# Patient Record
Sex: Female | Born: 1945 | Race: White | Hispanic: No | Marital: Married | Smoking: Never smoker
Health system: Southern US, Community
[De-identification: ages and names within clinical notes are randomized; demographics above are authoritative.]

## PROBLEM LIST (undated history)

## (undated) DIAGNOSIS — R011 Cardiac murmur, unspecified: Secondary | ICD-10-CM

## (undated) DIAGNOSIS — K5190 Ulcerative colitis, unspecified, without complications: Secondary | ICD-10-CM

## (undated) DIAGNOSIS — C50919 Malignant neoplasm of unspecified site of unspecified female breast: Secondary | ICD-10-CM

## (undated) DIAGNOSIS — R7989 Other specified abnormal findings of blood chemistry: Secondary | ICD-10-CM

## (undated) DIAGNOSIS — L900 Lichen sclerosus et atrophicus: Secondary | ICD-10-CM

## (undated) DIAGNOSIS — C4920 Malignant neoplasm of connective and soft tissue of unspecified lower limb, including hip: Secondary | ICD-10-CM

## (undated) DIAGNOSIS — K432 Incisional hernia without obstruction or gangrene: Secondary | ICD-10-CM

## (undated) DIAGNOSIS — Z853 Personal history of malignant neoplasm of breast: Secondary | ICD-10-CM

## (undated) DIAGNOSIS — C801 Malignant (primary) neoplasm, unspecified: Secondary | ICD-10-CM

## (undated) DIAGNOSIS — Z5189 Encounter for other specified aftercare: Secondary | ICD-10-CM

## (undated) DIAGNOSIS — M810 Age-related osteoporosis without current pathological fracture: Secondary | ICD-10-CM

## (undated) DIAGNOSIS — M7752 Other enthesopathy of left foot: Secondary | ICD-10-CM

## (undated) DIAGNOSIS — K219 Gastro-esophageal reflux disease without esophagitis: Secondary | ICD-10-CM

## (undated) DIAGNOSIS — I739 Peripheral vascular disease, unspecified: Secondary | ICD-10-CM

## (undated) DIAGNOSIS — Z85820 Personal history of malignant melanoma of skin: Secondary | ICD-10-CM

## (undated) DIAGNOSIS — K760 Fatty (change of) liver, not elsewhere classified: Secondary | ICD-10-CM

## (undated) DIAGNOSIS — I10 Essential (primary) hypertension: Secondary | ICD-10-CM

## (undated) DIAGNOSIS — S92902A Unspecified fracture of left foot, initial encounter for closed fracture: Secondary | ICD-10-CM

## (undated) DIAGNOSIS — Z85828 Personal history of other malignant neoplasm of skin: Secondary | ICD-10-CM

## (undated) DIAGNOSIS — I890 Lymphedema, not elsewhere classified: Secondary | ICD-10-CM

## (undated) DIAGNOSIS — K635 Polyp of colon: Secondary | ICD-10-CM

## (undated) DIAGNOSIS — R112 Nausea with vomiting, unspecified: Secondary | ICD-10-CM

## (undated) DIAGNOSIS — K529 Noninfective gastroenteritis and colitis, unspecified: Secondary | ICD-10-CM

## (undated) DIAGNOSIS — Z9889 Other specified postprocedural states: Secondary | ICD-10-CM

## (undated) DIAGNOSIS — N390 Urinary tract infection, site not specified: Secondary | ICD-10-CM

## (undated) DIAGNOSIS — R32 Unspecified urinary incontinence: Secondary | ICD-10-CM

## (undated) DIAGNOSIS — K52832 Lymphocytic colitis: Secondary | ICD-10-CM

## (undated) DIAGNOSIS — E785 Hyperlipidemia, unspecified: Secondary | ICD-10-CM

## (undated) HISTORY — PX: BUNIONECTOMY: SHX129

## (undated) HISTORY — DX: Malignant (primary) neoplasm, unspecified: C80.1

## (undated) HISTORY — DX: Peripheral vascular disease, unspecified: I73.9

## (undated) HISTORY — DX: Hyperlipidemia, unspecified: E78.5

## (undated) HISTORY — DX: Essential (primary) hypertension: I10

## (undated) HISTORY — DX: Personal history of malignant melanoma of skin: Z85.820

## (undated) HISTORY — DX: Personal history of other malignant neoplasm of skin: Z85.828

## (undated) HISTORY — DX: Nausea with vomiting, unspecified: R11.2

## (undated) HISTORY — PX: BREAST IMPLANT: SHX2716

## (undated) HISTORY — DX: Urinary tract infection, site not specified: N39.0

## (undated) HISTORY — DX: Age-related osteoporosis without current pathological fracture: M81.0

## (undated) HISTORY — PX: COLON SURGERY: SHX602

## (undated) HISTORY — DX: Unspecified fracture of left foot, initial encounter for closed fracture: S92.902A

## (undated) HISTORY — DX: Other specified postprocedural states: Z98.890

## (undated) HISTORY — DX: Fatty (change of) liver, not elsewhere classified: K76.0

## (undated) HISTORY — PX: OOPHORECTOMY: SHX86

## (undated) HISTORY — DX: Lymphocytic colitis: K52.832

## (undated) HISTORY — DX: Personal history of malignant neoplasm of breast: Z85.3

## (undated) HISTORY — PX: SMALL INTESTINE SURGERY: SHX150

## (undated) HISTORY — DX: Gastro-esophageal reflux disease without esophagitis: K21.9

## (undated) HISTORY — DX: Noninfective gastroenteritis and colitis, unspecified: K52.9

## (undated) HISTORY — DX: Unspecified urinary incontinence: R32

## (undated) HISTORY — DX: Polyp of colon: K63.5

## (undated) HISTORY — DX: Lymphedema, not elsewhere classified: I89.0

## (undated) HISTORY — PX: MASTECTOMY: SHX3

## (undated) HISTORY — PX: SIGMOID RESECTION / RECTOPEXY: SUR1294

## (undated) HISTORY — DX: Other enthesopathy of left foot and ankle: M77.52

## (undated) HISTORY — DX: Other specified abnormal findings of blood chemistry: R79.89

## (undated) HISTORY — DX: Incisional hernia without obstruction or gangrene: K43.2

## (undated) HISTORY — DX: Cardiac murmur, unspecified: R01.1

## (undated) HISTORY — DX: Malignant neoplasm of connective and soft tissue of unspecified lower limb, including hip: C49.20

## (undated) HISTORY — DX: Ulcerative colitis, unspecified, without complications: K51.90

## (undated) HISTORY — DX: Lichen sclerosus et atrophicus: L90.0

## (undated) HISTORY — PX: AUGMENTATION MAMMAPLASTY: SUR837

## (undated) HISTORY — PX: OTHER SURGICAL HISTORY: SHX169

## (undated) HISTORY — PX: TONSILLECTOMY AND ADENOIDECTOMY: SHX28

## (undated) HISTORY — DX: Encounter for other specified aftercare: Z51.89

---
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
## 1969-02-27 DIAGNOSIS — Z5189 Encounter for other specified aftercare: Secondary | ICD-10-CM

## 1969-02-27 HISTORY — DX: Encounter for other specified aftercare: Z51.89

## 1973-06-29 DIAGNOSIS — D48118 Desmoid tumor of other site: Secondary | ICD-10-CM

## 1973-06-29 DIAGNOSIS — I739 Peripheral vascular disease, unspecified: Secondary | ICD-10-CM

## 1973-06-29 DIAGNOSIS — D481 Neoplasm of uncertain behavior of connective and other soft tissue: Secondary | ICD-10-CM

## 1973-06-29 HISTORY — DX: Desmoid tumor of other site: D48.118

## 1973-06-29 HISTORY — DX: Peripheral vascular disease, unspecified: I73.9

## 1973-06-29 HISTORY — DX: Neoplasm of uncertain behavior of connective and other soft tissue: D48.1

## 1977-06-29 HISTORY — PX: HYSTERECTOMY: SHX81

## 1977-06-29 HISTORY — PX: NEPHRECTOMY: SHX65

## 1977-06-29 HISTORY — PX: ABDOMINAL HYSTERECTOMY: SHX81

## 1995-06-30 DIAGNOSIS — C801 Malignant (primary) neoplasm, unspecified: Secondary | ICD-10-CM

## 1995-06-30 HISTORY — DX: Malignant (primary) neoplasm, unspecified: C80.1

## 1995-06-30 HISTORY — PX: MASTECTOMY: SHX3

## 1995-06-30 HISTORY — PX: BREAST SURGERY: SHX581

## 1999-01-27 ENCOUNTER — Other Ambulatory Visit: Admission: RE | Admit: 1999-01-27 | Discharge: 1999-01-27 | Payer: Self-pay | Admitting: Gynecology

## 1999-12-25 ENCOUNTER — Ambulatory Visit (HOSPITAL_COMMUNITY): Admission: RE | Admit: 1999-12-25 | Discharge: 1999-12-25 | Payer: Self-pay | Admitting: Gastroenterology

## 2000-03-19 ENCOUNTER — Ambulatory Visit (HOSPITAL_BASED_OUTPATIENT_CLINIC_OR_DEPARTMENT_OTHER): Admission: RE | Admit: 2000-03-19 | Discharge: 2000-03-19 | Payer: Self-pay | Admitting: General Surgery

## 2000-06-29 HISTORY — PX: OTHER SURGICAL HISTORY: SHX169

## 2001-01-24 ENCOUNTER — Other Ambulatory Visit: Admission: RE | Admit: 2001-01-24 | Discharge: 2001-01-24 | Payer: Self-pay | Admitting: Gynecology

## 2002-02-16 ENCOUNTER — Other Ambulatory Visit: Admission: RE | Admit: 2002-02-16 | Discharge: 2002-02-16 | Payer: Self-pay

## 2003-04-11 ENCOUNTER — Other Ambulatory Visit: Admission: RE | Admit: 2003-04-11 | Discharge: 2003-04-11 | Payer: Self-pay | Admitting: Gynecology

## 2003-05-07 ENCOUNTER — Ambulatory Visit (HOSPITAL_COMMUNITY): Admission: RE | Admit: 2003-05-07 | Discharge: 2003-05-07 | Payer: Self-pay | Admitting: Gastroenterology

## 2009-07-15 ENCOUNTER — Ambulatory Visit: Payer: Self-pay | Admitting: Genetic Counselor

## 2009-08-26 ENCOUNTER — Ambulatory Visit: Payer: Self-pay | Admitting: Genetic Counselor

## 2010-11-14 NOTE — Procedures (Signed)
[facility]  Patient:    Megan Mahoney, DINGWALL                        MRN: [id number] Proc. Date: 12/25/99 Adm. Date:  [id number] Attending:  Orland Mustard CC:         Raynald Kemp, M.D.             Al Decant. Janey Greaser, M.D.                           Procedure Report  PROCEDURE:  Colonoscopy.  MEDICATIONS:  Fentanyl 62.5 mcg, Versed 6 mg IV.  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 She had a history of a colectomy in 1980 due to diverticulitis. She had a colonoscopy 3 years ago with an adenomatous polyp removed. There was slight narrowing in her anastomosis. She comes in now for a repeat colonoscopy for follow-up of her colon polyps.  SCOPE:  Olympus adult video colonoscope.  DESCRIPTION OF PROCEDURE:  The procedure had been explained to the patient and consent obtained. With the patient in the left lateral decubitus position, the Olympus adult video colonoscope inserted, advanced under direct vision. The prep was excellent. We were able to advance to the cecum without difficulty. The right lower quadrant was transilluminated, ileocecal valve and appendiceal orifice seen. Scope withdrawn. The cecum, ascending colon, hepatic flexure, transverse colon, splenic flexure, descending and sigmoid colon were seen well. No further polyps seen. The anastomosis was slightly narrowed but there was easy passage of the scope. The scope withdrawn. The patient tolerated the procedure well and maintained on low flow oxygen and pulse oximeter throughout the procedure with no obvious problem.  ASSESSMENT: 1. No evidence of further polyps. 2. Some internal hemorrhoids. 3. Slight stricturing of the anastomosis but easy passage of the scope.  PLAN:   Will recommend repeating in 3 years to evaluate for additional polyps. Also placed on a high fiber diet.  Keep her stools soft. DD:  12/25/99 TD:  12/26/99 Job: [id number]

## 2010-11-14 NOTE — Op Note (Signed)
   [name], Mahoney                           ACCOUNT NO.:  [id number]   MEDICAL RECORD NO.:  [id number]                   PATIENT TYPE:  AMB   LOCATION:  ENDO                                 FACILITY:  [facility]   PHYSICIAN:  James L. Malon Kindle., M.D.          DATE OF BIRTH:  06/19/1946   DATE OF PROCEDURE:  05/07/2003  DATE OF DISCHARGE:                                 OPERATIVE REPORT   PROCEDURE:  Colonoscopy.   MEDICATIONS:  1. Fentanyl 25 mcg.  2. , Versed 5 mg IV.   INDICATION:  Nice woman, who has a previous history of breast cancer.  In  addition to this, she has had a colectomy due to diverticulitis in 1980.  She had an adenomatous polyp removed.  Had a colonoscopy several years ago  that was negative.  This is done as a follow-up.   DESCRIPTION OF PROCEDURE:  The procedure had been explained to the patient  and consent obtained.  The patient in the left lateral decubitus position,  the Olympus pediatric adjustable scope was inserted and advanced.  We were  able to advance easily to the cecum using abdominal pressure.  The cecum was  identified by identification of the ileocecal valve and appendiceal orifice.  The scope was withdrawn, and the cecum, ascending colon, hepatic flexure,  transverse colon, splenic flexure, descending and sigmoid colon were seen  well.  The anastomosis was seen in the sigmoid colon.  No significant  diverticular disease.  No further polyp was seen, and the rectum was free of  polyps as well.  The scope was withdrawn.  The patient tolerated the  procedure well, was maintained on low-flow oxygen and pulse oximetry  throughout the procedure.   ASSESSMENT:  No evidence of further polyps.   PLAN:  We will recommended yearly Hemoccults and recommend repeating the  procedure in five years.                                               James L. Malon Kindle., M.D.    Waldron Session  D:  05/07/2003  T:  05/07/2003  Job:  [id number]   cc:   Megan Mahoney.  Megan Greaser, MD  [address]  [place]  [place] [address]  Fax: [phone]

## 2011-07-27 DIAGNOSIS — Z01419 Encounter for gynecological examination (general) (routine) without abnormal findings: Secondary | ICD-10-CM | POA: Diagnosis not present

## 2011-07-27 DIAGNOSIS — Z124 Encounter for screening for malignant neoplasm of cervix: Secondary | ICD-10-CM | POA: Diagnosis not present

## 2011-07-27 DIAGNOSIS — Z0000 Encounter for general adult medical examination without abnormal findings: Secondary | ICD-10-CM | POA: Diagnosis not present

## 2011-09-21 DIAGNOSIS — I69920 Aphasia following unspecified cerebrovascular disease: Secondary | ICD-10-CM | POA: Diagnosis not present

## 2011-09-21 DIAGNOSIS — I890 Lymphedema, not elsewhere classified: Secondary | ICD-10-CM | POA: Diagnosis not present

## 2011-09-21 DIAGNOSIS — Z7189 Other specified counseling: Secondary | ICD-10-CM | POA: Diagnosis not present

## 2011-09-21 DIAGNOSIS — Z9010 Acquired absence of unspecified breast and nipple: Secondary | ICD-10-CM | POA: Diagnosis not present

## 2011-09-21 DIAGNOSIS — C4370 Malignant melanoma of unspecified lower limb, including hip: Secondary | ICD-10-CM | POA: Diagnosis not present

## 2011-09-21 DIAGNOSIS — D4819 Other specified neoplasm of uncertain behavior of connective and other soft tissue: Secondary | ICD-10-CM | POA: Diagnosis not present

## 2011-09-21 DIAGNOSIS — C50919 Malignant neoplasm of unspecified site of unspecified female breast: Secondary | ICD-10-CM | POA: Diagnosis not present

## 2011-09-21 DIAGNOSIS — R918 Other nonspecific abnormal finding of lung field: Secondary | ICD-10-CM | POA: Diagnosis not present

## 2011-09-21 DIAGNOSIS — Z79899 Other long term (current) drug therapy: Secondary | ICD-10-CM | POA: Diagnosis not present

## 2011-09-21 DIAGNOSIS — D481 Neoplasm of uncertain behavior of connective and other soft tissue: Secondary | ICD-10-CM | POA: Diagnosis not present

## 2011-09-21 DIAGNOSIS — I69928 Other speech and language deficits following unspecified cerebrovascular disease: Secondary | ICD-10-CM | POA: Diagnosis not present

## 2011-11-09 DIAGNOSIS — K5289 Other specified noninfective gastroenteritis and colitis: Secondary | ICD-10-CM | POA: Diagnosis not present

## 2012-05-19 DIAGNOSIS — Z85828 Personal history of other malignant neoplasm of skin: Secondary | ICD-10-CM | POA: Diagnosis not present

## 2012-05-19 DIAGNOSIS — Z85820 Personal history of malignant melanoma of skin: Secondary | ICD-10-CM | POA: Diagnosis not present

## 2012-05-19 DIAGNOSIS — D235 Other benign neoplasm of skin of trunk: Secondary | ICD-10-CM | POA: Diagnosis not present

## 2012-05-19 DIAGNOSIS — L821 Other seborrheic keratosis: Secondary | ICD-10-CM | POA: Diagnosis not present

## 2012-05-19 DIAGNOSIS — D239 Other benign neoplasm of skin, unspecified: Secondary | ICD-10-CM | POA: Diagnosis not present

## 2012-05-19 DIAGNOSIS — L570 Actinic keratosis: Secondary | ICD-10-CM | POA: Diagnosis not present

## 2012-05-19 DIAGNOSIS — D485 Neoplasm of uncertain behavior of skin: Secondary | ICD-10-CM | POA: Diagnosis not present

## 2012-07-27 DIAGNOSIS — Z01419 Encounter for gynecological examination (general) (routine) without abnormal findings: Secondary | ICD-10-CM | POA: Diagnosis not present

## 2012-07-27 DIAGNOSIS — Z1151 Encounter for screening for human papillomavirus (HPV): Secondary | ICD-10-CM | POA: Diagnosis not present

## 2012-07-27 DIAGNOSIS — Z124 Encounter for screening for malignant neoplasm of cervix: Secondary | ICD-10-CM | POA: Diagnosis not present

## 2012-07-27 LAB — HM PAP SMEAR: HM PAP: NEGATIVE

## 2012-08-03 DIAGNOSIS — H35379 Puckering of macula, unspecified eye: Secondary | ICD-10-CM | POA: Diagnosis not present

## 2012-09-21 DIAGNOSIS — D481 Neoplasm of uncertain behavior of connective and other soft tissue: Secondary | ICD-10-CM | POA: Insufficient documentation

## 2012-09-22 DIAGNOSIS — I69920 Aphasia following unspecified cerebrovascular disease: Secondary | ICD-10-CM | POA: Diagnosis not present

## 2012-09-22 DIAGNOSIS — I679 Cerebrovascular disease, unspecified: Secondary | ICD-10-CM | POA: Diagnosis not present

## 2012-09-22 DIAGNOSIS — C50919 Malignant neoplasm of unspecified site of unspecified female breast: Secondary | ICD-10-CM | POA: Diagnosis not present

## 2012-09-22 DIAGNOSIS — D481 Neoplasm of uncertain behavior of connective and other soft tissue: Secondary | ICD-10-CM | POA: Diagnosis not present

## 2012-09-22 DIAGNOSIS — Z853 Personal history of malignant neoplasm of breast: Secondary | ICD-10-CM | POA: Diagnosis not present

## 2012-09-22 DIAGNOSIS — Z79899 Other long term (current) drug therapy: Secondary | ICD-10-CM | POA: Diagnosis not present

## 2012-09-22 DIAGNOSIS — R4701 Aphasia: Secondary | ICD-10-CM | POA: Diagnosis not present

## 2012-09-22 DIAGNOSIS — Z9010 Acquired absence of unspecified breast and nipple: Secondary | ICD-10-CM | POA: Diagnosis not present

## 2012-11-23 DIAGNOSIS — K5289 Other specified noninfective gastroenteritis and colitis: Secondary | ICD-10-CM | POA: Diagnosis not present

## 2012-11-23 DIAGNOSIS — Z86010 Personal history of colonic polyps: Secondary | ICD-10-CM | POA: Diagnosis not present

## 2013-02-16 DIAGNOSIS — B379 Candidiasis, unspecified: Secondary | ICD-10-CM | POA: Diagnosis not present

## 2013-04-27 DIAGNOSIS — Z86010 Personal history of colonic polyps: Secondary | ICD-10-CM | POA: Diagnosis not present

## 2013-04-27 DIAGNOSIS — Z09 Encounter for follow-up examination after completed treatment for conditions other than malignant neoplasm: Secondary | ICD-10-CM | POA: Diagnosis not present

## 2013-04-27 DIAGNOSIS — K648 Other hemorrhoids: Secondary | ICD-10-CM | POA: Diagnosis not present

## 2013-04-27 DIAGNOSIS — K5289 Other specified noninfective gastroenteritis and colitis: Secondary | ICD-10-CM | POA: Diagnosis not present

## 2013-04-27 DIAGNOSIS — Z8719 Personal history of other diseases of the digestive system: Secondary | ICD-10-CM | POA: Diagnosis not present

## 2013-04-27 DIAGNOSIS — D126 Benign neoplasm of colon, unspecified: Secondary | ICD-10-CM | POA: Diagnosis not present

## 2013-05-29 ENCOUNTER — Encounter: Payer: Self-pay | Admitting: Obstetrics and Gynecology

## 2013-06-15 DIAGNOSIS — L821 Other seborrheic keratosis: Secondary | ICD-10-CM | POA: Diagnosis not present

## 2013-06-15 DIAGNOSIS — L570 Actinic keratosis: Secondary | ICD-10-CM | POA: Diagnosis not present

## 2013-06-15 DIAGNOSIS — Z85820 Personal history of malignant melanoma of skin: Secondary | ICD-10-CM | POA: Diagnosis not present

## 2013-06-15 DIAGNOSIS — Z85828 Personal history of other malignant neoplasm of skin: Secondary | ICD-10-CM | POA: Diagnosis not present

## 2013-06-15 DIAGNOSIS — D239 Other benign neoplasm of skin, unspecified: Secondary | ICD-10-CM | POA: Diagnosis not present

## 2013-07-04 ENCOUNTER — Other Ambulatory Visit: Payer: Self-pay

## 2013-07-04 MED ORDER — VENLAFAXINE HCL ER 37.5 MG PO CP24: 37.5000 mg | ORAL_CAPSULE | Freq: Every day | ORAL | Status: DC

## 2013-07-04 NOTE — Telephone Encounter (Signed)
Pt has aex 08-23-13 with dr Quincy Simmonds. rx had to be sent with another provider other than dr romine since she retired. 90 day supply sent with no refills

## 2013-07-28 ENCOUNTER — Ambulatory Visit: Payer: Self-pay | Admitting: Obstetrics and Gynecology

## 2013-07-31 ENCOUNTER — Ambulatory Visit: Payer: Self-pay | Admitting: Obstetrics and Gynecology

## 2013-08-23 ENCOUNTER — Ambulatory Visit: Payer: Self-pay | Admitting: Obstetrics and Gynecology

## 2013-08-23 ENCOUNTER — Encounter: Payer: Self-pay | Admitting: Obstetrics and Gynecology

## 2013-08-30 ENCOUNTER — Other Ambulatory Visit (HOSPITAL_COMMUNITY): Payer: Self-pay | Admitting: Family Medicine

## 2013-08-30 DIAGNOSIS — Z1231 Encounter for screening mammogram for malignant neoplasm of breast: Secondary | ICD-10-CM

## 2013-08-31 ENCOUNTER — Other Ambulatory Visit: Payer: Self-pay | Admitting: *Deleted

## 2013-08-31 NOTE — Telephone Encounter (Signed)
E-scribe requesting Venlafaxine XR.  Last AEX 07/27/2012 Last refill 07/04/2013 #90/0 refills Next appt 09/01/2013.  -Called pt and she has enough pills until tonight. We will refill rx when she comes in tomorrow. - Pt agreed.

## 2013-09-01 ENCOUNTER — Other Ambulatory Visit: Payer: Self-pay | Admitting: Obstetrics and Gynecology

## 2013-09-01 ENCOUNTER — Telehealth: Payer: Self-pay | Admitting: Obstetrics and Gynecology

## 2013-09-01 ENCOUNTER — Encounter: Payer: Self-pay | Admitting: Obstetrics and Gynecology

## 2013-09-01 ENCOUNTER — Ambulatory Visit (INDEPENDENT_AMBULATORY_CARE_PROVIDER_SITE_OTHER): Payer: Medicare Other | Admitting: Obstetrics and Gynecology

## 2013-09-01 VITALS — BP 118/70 | HR 76 | Ht 65.5 in | Wt 141.5 lb

## 2013-09-01 DIAGNOSIS — Z09 Encounter for follow-up examination after completed treatment for conditions other than malignant neoplasm: Secondary | ICD-10-CM | POA: Diagnosis not present

## 2013-09-01 DIAGNOSIS — Z124 Encounter for screening for malignant neoplasm of cervix: Secondary | ICD-10-CM | POA: Diagnosis not present

## 2013-09-01 DIAGNOSIS — M8580 Other specified disorders of bone density and structure, unspecified site: Secondary | ICD-10-CM

## 2013-09-01 DIAGNOSIS — N951 Menopausal and female climacteric states: Secondary | ICD-10-CM

## 2013-09-01 DIAGNOSIS — M899 Disorder of bone, unspecified: Secondary | ICD-10-CM | POA: Diagnosis not present

## 2013-09-01 DIAGNOSIS — Z01419 Encounter for gynecological examination (general) (routine) without abnormal findings: Secondary | ICD-10-CM

## 2013-09-01 DIAGNOSIS — M949 Disorder of cartilage, unspecified: Secondary | ICD-10-CM | POA: Diagnosis not present

## 2013-09-01 DIAGNOSIS — Z780 Asymptomatic menopausal state: Secondary | ICD-10-CM

## 2013-09-01 LAB — COMPREHENSIVE METABOLIC PANEL
ALK PHOS: 61 U/L (ref 39–117)
ALT: 16 U/L (ref 0–35)
AST: 21 U/L (ref 0–37)
Albumin: 4.3 g/dL (ref 3.5–5.2)
BUN: 11 mg/dL (ref 6–23)
CO2: 30 mEq/L (ref 19–32)
Calcium: 9 mg/dL (ref 8.4–10.5)
Chloride: 103 mEq/L (ref 96–112)
Creat: 0.77 mg/dL (ref 0.50–1.10)
Glucose, Bld: 91 mg/dL (ref 70–99)
Potassium: 4.4 mEq/L (ref 3.5–5.3)
Sodium: 139 mEq/L (ref 135–145)
Total Bilirubin: 0.2 mg/dL (ref 0.2–1.2)
Total Protein: 6.8 g/dL (ref 6.0–8.3)

## 2013-09-01 LAB — CBC
HCT: 40.2 % (ref 36.0–46.0)
Hemoglobin: 13.6 g/dL (ref 12.0–15.0)
MCH: 28.9 pg (ref 26.0–34.0)
MCHC: 33.8 g/dL (ref 30.0–36.0)
MCV: 85.5 fL (ref 78.0–100.0)
PLATELETS: 261 10*3/uL (ref 150–400)
RBC: 4.7 MIL/uL (ref 3.87–5.11)
RDW: 13.9 % (ref 11.5–15.5)
WBC: 4.7 10*3/uL (ref 4.0–10.5)

## 2013-09-01 MED ORDER — VENLAFAXINE HCL ER 37.5 MG PO CP24: 37.5000 mg | ORAL_CAPSULE | Freq: Every day | ORAL | Status: DC

## 2013-09-01 NOTE — Patient Instructions (Signed)

## 2013-09-01 NOTE — Progress Notes (Addendum)
Patient ID: Megan Mahoney, female   DOB: July 12, 1945, 68 y.o.   MRN: [id number] GYNECOLOGY VISIT  PCP:  Milagros Evener, MD  Referring provider:   HPI: 68 y.o.   Single  Caucasian  female   G2P2002 with No LMP recorded. Patient has had a hysterectomy.   here for   AEX.  Diagnosed with colitis 2 years ago.  Dr. Oletta Lamas treating.   Wants to wean off Effexor XR 37.5 mg.  Wants general labs today - lipids, CMP, CBC, TSH.  Hgb:     Urine:  Unable to go.  GYNECOLOGIC HISTORY: No LMP recorded. Patient has had a hysterectomy. Sexually active:  no Partner preference: female Contraception:  hysterectomy  Menopausal hormone therapy: n/a DES exposure:   no Blood transfusions:   Multiple blood transfusions following surgery for desmoid turmor in the 70's. Sexually transmitted diseases:  no  GYN procedures and prior surgeries:  TAH/BSO/nephrectomy - desmoid tumor.  History of radiation treatment.  Last mammogram:  08/2012 [id number]               Last pap and high risk HPV testing:   07-27-12 wnl:neg HR HPV History of abnormal pap smear:  no   OB History   Grav Para Term Preterm Abortions TAB SAB Ect Mult Living   2 2 2       2        LIFESTYLE: Exercise:    no           Tobacco:    no Alcohol:       no Drug use:    no  OTHER HEALTH MAINTENANCE: Tetanus/TDap:   2010 Gardisil:               n/a Influenza:            03-2013 Zostavax:             Completed with PCP  Bone density:      2011 revealed osteopenia  Colonoscopy:      04/2013 wnl with Dr. Laurence Spates  Cholesterol check: 2013 wnl   Family History  Problem Relation Age of Onset  . Ovarian cancer Sister 27    dec  . Cancer Maternal Grandmother 90    colon  . Transient ischemic attack Maternal Grandmother   . Breast cancer Paternal Grandmother 82    There are no active problems to display for this patient.  Past Medical History  Diagnosis Date  . Osteopenia   . History of breast cancer   . Cancer 1997   Rt. breast ca  . Blood transfusion without reported diagnosis 1970's    multiple--following desmoid tumor surgery  . Heart murmur     Past Surgical History  Procedure Laterality Date  . Nephrectomy  1979  . Abdominal hysterectomy  1979    TAH/BSO  . Breast surgery  1997    Rt. mastectomy/reconstruction  . Melanoma removal  2002    --rt. thigh  . Desmoid tumor      --left groin--s/p five resections  . Sigmoid resection / rectopexy    . Tonsillectomy and adenoidectomy      ALLERGIES: Codeine  Current Outpatient Prescriptions  Medication Sig Dispense Refill  . Cyanocobalamin (VITAMIN B-12) 1000 MCG SUBL Place 1,000 mcg under the tongue daily.      . mesalamine (LIALDA) 1.2 G EC tablet Take 1.2 g by mouth daily with breakfast.      . Multiple Vitamin (MULTIVITAMIN) capsule Take 1 capsule  by mouth daily.      . polycarbophil (FIBERCON) 625 MG tablet Take 1,250 mg by mouth daily.      Marland Kitchen venlafaxine XR (EFFEXOR XR) 37.5 MG 24 hr capsule Take 1 capsule (37.5 mg total) by mouth daily with breakfast.  90 capsule  0   No current facility-administered medications for this visit.     ROS:  Pertinent items are noted in HPI.  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  Husband will retire soon.   PHYSICAL EXAMINATION:    BP 118/70  Pulse 76  Ht 5' 5.5" (1.664 m)  Wt 141 lb 8 oz (64.184 kg)  BMI 23.18 kg/m2   Wt Readings from Last 3 Encounters:  09/01/13 141 lb 8 oz (64.184 kg)     Ht Readings from Last 3 Encounters:  09/01/13 5' 5.5" (1.664 m)    General appearance: alert, cooperative and appears stated age Head: Normocephalic, without obvious abnormality, atraumatic Neck: no adenopathy, supple, symmetrical, trachea midline and thyroid not enlarged, symmetric, no tenderness/mass/nodules Lungs: clear to auscultation bilaterally Breasts: Right breast absent and implant palpable.  Left breast - scar noted, No nipple retraction or dimpling, No nipple discharge or bleeding, No  axillary or supraclavicular adenopathy, Normal to palpation without dominant masses. Heart: regular rate and rhythm Abdomen: left paramedian scar extending down left groin and left thigh, left transverse scar, abdomen soft, non-tender; no masses,  no organomegaly Extremities: extremities normal, atraumatic, no cyanosis or edema Skin: Skin color, texture, turgor normal. No rashes or lesions Lymph nodes: Cervical, supraclavicular, and axillary nodes normal. No abnormal inguinal nodes palpated.  Left inguinal area with hernia. Neurologic: Grossly normal  Thin Pederson speculum used.    Pelvic: External genitalia:  no lesions Hypopigmentation of the clitoral hood and the perianal region.               Urethra:  normal appearing urethra with no masses, tenderness or lesions              Bartholins and Skenes: normal                 Vagina: normal appearing vagina with normal color and discharge, no lesions              Cervix: absent              Pap and high risk HPV testing done: no.            Bimanual Exam:  Uterus:   absent                                      Adnexa:  no masses                                      Rectovaginal: Confirms                                      Anus:  normal sphincter tone, no lesions  ASSESSMENT  Normal gynecologic exam. Status post TAH/BSO and status post nephrectomy for desmoid tumor. Right breast cancer.  Status post right mastectomy with reconstruction.  Osteopenia. Effexor patient.  Desires to wean off.  PLAN  Mammogram recommended yearly - Will do at  [facility].  Order placed.  Bone density at [place] placed.  Pap smear and high risk HPV testing not indicated.  TSH, lipid profile, CBC, CMP. Counseled on self breast exam, Calcium and vitamin D intake, exercise. Will refill Effexor XR 37.5 mg and plan to start weaning.   Will continue with Effexor XR 37.5 mg and will then need to convert to the Effexor immediate release  37.5 mg po bid and work down to Effexor immediate release 25 mg po bid.  Return annually or prn   An After Visit Summary was printed and given to the patient.

## 2013-09-01 NOTE — Telephone Encounter (Signed)
Phone call to discuss weaning plan for Effexor.  Currently on Effexor XR 37.5 mg daily.   Will continue for the next month on the above.  Will then switch to Effexor immediate release 37 mg po bid and work down to Effexor immediate release 25 mg po bid.  I just left a message telling her that we needed to get in touch with each other on Monday, 09/04/13.  No details left on the phone regarding the above.

## 2013-09-02 LAB — LIPID PANEL
CHOL/HDL RATIO: 2.4 ratio
Cholesterol: 171 mg/dL (ref 0–200)
HDL: 70 mg/dL (ref >39)
LDL CALC: 45 mg/dL (ref 0–99)
Triglycerides: 278 mg/dL — ABNORMAL HIGH (ref <150)
VLDL: 56 mg/dL — AB (ref 0–40)

## 2013-09-02 LAB — TSH: TSH: 2.18 u[IU]/mL (ref 0.350–4.500)

## 2013-09-04 MED ORDER — VENLAFAXINE HCL 25 MG PO TABS: 25.0000 mg | ORAL_TABLET | Freq: Two times a day (BID) | ORAL | Status: DC

## 2013-09-04 NOTE — Telephone Encounter (Signed)
Megan Mahoney,  Please call the patient and let her know what her weaning protocol will be for coming off the Effexor. I have not placed any orders other than refilling the Effexor XR 37.5 mg from her visit the last week.  I gave her enough for one month.  Thanks!

## 2013-09-04 NOTE — Telephone Encounter (Signed)
Phone call to discuss lab results and weaning off Effexor.  Patient informed of elevated triglycerides and VLDL. She will do low fat/low cholesterol diet and exercise for 3 months and then do a fasting  recheck with her PCP.  Patient will start weaning off Effexor XR 37.5 mg next month.  We decided she will go directly to Effexor immediate release 25 mg po bid for 3 months and then try taking it just once a day for 3 months before stopping it.

## 2013-09-04 NOTE — Telephone Encounter (Signed)
Please call patient on her cell phone [phone]

## 2013-09-04 NOTE — Telephone Encounter (Signed)
Pt is returning a call to Dr. Quincy Simmonds

## 2013-09-05 ENCOUNTER — Telehealth: Payer: Self-pay | Admitting: *Deleted

## 2013-09-07 ENCOUNTER — Telehealth: Payer: Self-pay

## 2013-09-07 NOTE — Telephone Encounter (Signed)
LMOVM to call for lab results (cell#)

## 2013-09-07 NOTE — Telephone Encounter (Signed)
Message copied by Lowella Fairy on Thu Sep 07, 2013  3:22 PM ------      Message from: [place], [place]: Mon Sep 04, 2013  5:39 PM       Arbie Cookey,             Here is the cholesterol panel for this patient.       Her triglycerides and VLDL cholesterol are elevated.      She will need to do a low fat and low cholesterol diet and increase exercise and follow up with her PCP for management.             Thanks!            Danella Maiers. ------

## 2013-09-13 ENCOUNTER — Ambulatory Visit: Payer: Self-pay | Admitting: Obstetrics and Gynecology

## 2013-09-19 NOTE — Telephone Encounter (Signed)
See next phone message on 09-07-13.  Encounter closed.

## 2013-09-25 ENCOUNTER — Ambulatory Visit (HOSPITAL_COMMUNITY)
Admission: RE | Admit: 2013-09-25 | Discharge: 2013-09-25 | Disposition: A | Discharge status: Home / Self Care | Payer: Medicare Other | Source: Ambulatory Visit | Attending: Family Medicine | Admitting: Family Medicine

## 2013-09-25 ENCOUNTER — Other Ambulatory Visit (HOSPITAL_COMMUNITY): Payer: Self-pay | Admitting: Family Medicine

## 2013-09-25 DIAGNOSIS — Z1231 Encounter for screening mammogram for malignant neoplasm of breast: Secondary | ICD-10-CM

## 2013-11-08 DIAGNOSIS — K5289 Other specified noninfective gastroenteritis and colitis: Secondary | ICD-10-CM | POA: Diagnosis not present

## 2013-11-22 DIAGNOSIS — M25579 Pain in unspecified ankle and joints of unspecified foot: Secondary | ICD-10-CM | POA: Diagnosis not present

## 2013-11-30 DIAGNOSIS — M25579 Pain in unspecified ankle and joints of unspecified foot: Secondary | ICD-10-CM | POA: Diagnosis not present

## 2013-12-15 DIAGNOSIS — M25579 Pain in unspecified ankle and joints of unspecified foot: Secondary | ICD-10-CM | POA: Diagnosis not present

## 2013-12-23 DIAGNOSIS — M25579 Pain in unspecified ankle and joints of unspecified foot: Secondary | ICD-10-CM | POA: Diagnosis not present

## 2014-01-05 ENCOUNTER — Ambulatory Visit (INDEPENDENT_AMBULATORY_CARE_PROVIDER_SITE_OTHER): Payer: Medicare Other | Admitting: Vascular Surgery

## 2014-01-05 ENCOUNTER — Other Ambulatory Visit (HOSPITAL_COMMUNITY): Payer: Self-pay | Admitting: Orthopedic Surgery

## 2014-01-05 ENCOUNTER — Other Ambulatory Visit: Payer: Self-pay

## 2014-01-05 ENCOUNTER — Ambulatory Visit (HOSPITAL_COMMUNITY)
Admission: RE | Admit: 2014-01-05 | Discharge: 2014-01-05 | Disposition: A | Discharge status: Home / Self Care | Payer: Medicare Other | Source: Ambulatory Visit | Attending: Cardiovascular Disease | Admitting: Cardiovascular Disease

## 2014-01-05 ENCOUNTER — Encounter: Payer: Self-pay | Admitting: Vascular Surgery

## 2014-01-05 ENCOUNTER — Ambulatory Visit (INDEPENDENT_AMBULATORY_CARE_PROVIDER_SITE_OTHER)
Admission: RE | Admit: 2014-01-05 | Discharge: 2014-01-05 | Disposition: A | Discharge status: Home / Self Care | Payer: Medicare Other | Source: Ambulatory Visit | Attending: Vascular Surgery | Admitting: Vascular Surgery

## 2014-01-05 VITALS — BP 139/80 | HR 72 | Ht 65.5 in | Wt 150.4 lb

## 2014-01-05 DIAGNOSIS — M79605 Pain in left leg: Secondary | ICD-10-CM

## 2014-01-05 DIAGNOSIS — I872 Venous insufficiency (chronic) (peripheral): Secondary | ICD-10-CM | POA: Diagnosis not present

## 2014-01-05 DIAGNOSIS — M79609 Pain in unspecified limb: Secondary | ICD-10-CM | POA: Diagnosis not present

## 2014-01-05 DIAGNOSIS — M7989 Other specified soft tissue disorders: Secondary | ICD-10-CM | POA: Diagnosis not present

## 2014-01-05 DIAGNOSIS — M25579 Pain in unspecified ankle and joints of unspecified foot: Secondary | ICD-10-CM | POA: Diagnosis not present

## 2014-01-05 DIAGNOSIS — R609 Edema, unspecified: Secondary | ICD-10-CM | POA: Insufficient documentation

## 2014-01-05 DIAGNOSIS — I82819 Embolism and thrombosis of superficial veins of unspecified lower extremities: Secondary | ICD-10-CM | POA: Diagnosis not present

## 2014-01-05 DIAGNOSIS — I8290 Acute embolism and thrombosis of unspecified vein: Secondary | ICD-10-CM | POA: Insufficient documentation

## 2014-01-05 NOTE — Progress Notes (Signed)
Left Lower Ext. Venous Duplex Completed. Preliminary results by tech -  Negative for DVT in the veins that were clearly visualized. The proximal common femoral vein was technically difficult to see due to scar tissue.   Oda Cogan, BS, RDMS, RVT

## 2014-01-05 NOTE — Progress Notes (Signed)
Referred by:  Aretta Nip, MD [name], [name] [address]  Reason for referral: Swollen Left leg  History of Present Illness  Megan Mahoney is a 68 y.o. 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  Reportedly this patient had a left leg injury in which her left foot was trapped during a fall in March.  She has had progressive lower leg swelling since then.  She had known history of left thigh swelling which she related to the prior 7 surgeries she's had on the left side.  The patient's symptoms include: severe swelling in L leg and numbness in foot.  The patient has had unknown history of DVT, nohistory of varicose vein, no history of venous stasis ulcers, no history of  Lymphedema and known history of skin changes in lower legs.  There is no family history of venous disorders.  The patient has not used compression stockings in the past.  Attempts at prior compression bandages have worsened her thigh swelling  Past Medical History  Diagnosis Date  . Osteopenia   . History of breast cancer   . Cancer 1997    Rt. breast ca  . Blood transfusion without reported diagnosis 1970's    multiple--following desmoid tumor surgery  . Heart murmur   . Ulcerative colitis   . Heart murmur   . Sarcoma of lower extremity     Past Surgical History  Procedure Laterality Date  . Nephrectomy  1979  . Abdominal hysterectomy  1979    TAH/BSO  . Breast surgery  1997    Rt. mastectomy/reconstruction  . Melanoma removal  2002    --rt. thigh  . Desmoid tumor      --left groin--s/p five resections  . Sigmoid resection / rectopexy    . Tonsillectomy and adenoidectomy    . Bunionectomy Right     History   Social History  . Marital Status: Single    Spouse Name: N/A    Number of Children: N/A  . Years of Education: N/A   Occupational History  . Not on file.   Social History  Main Topics  . Smoking status: Never Smoker   . Smokeless tobacco: Not on file  . Alcohol Use: No  . Drug Use: No  . Sexual Activity: No     Comment: TAH/BSO   Other Topics Concern  . Not on file   Social History Narrative  . No narrative on file    Family History  Problem Relation Age of Onset  . Ovarian cancer Sister 65    dec  . Cancer Sister   . Cancer Maternal Grandmother 90    colon  . Transient ischemic attack Maternal Grandmother   . Breast cancer Paternal Grandmother 44  . Deep vein thrombosis Mother   . Cancer Father   . Hypertension Father     Current Outpatient Prescriptions on File Prior to Visit  Medication Sig Dispense Refill  . Cyanocobalamin (VITAMIN B-12) 1000 MCG SUBL Place 2,500 mcg under the tongue daily.       . Multiple Vitamin (MULTIVITAMIN) capsule Take 1 capsule by mouth daily.      Marland Kitchen venlafaxine (EFFEXOR) 25 MG tablet Take 1 tablet (25 mg total) by mouth 2 (two) times daily with a meal. Take for three months. Then try taking 1 tablet (25 mg total) by mouth once a day for three  months and then stop.  60 tablet  5  . mesalamine (LIALDA) 1.2 G EC tablet Take 1.2 g by mouth daily with breakfast.      . polycarbophil (FIBERCON) 625 MG tablet Take 1,250 mg by mouth daily.       No current facility-administered medications on file prior to visit.    Allergies  Allergen Reactions  . Codeine Nausea And Vomiting    REVIEW OF SYSTEMS:  (Positives checked otherwise negative)  CARDIOVASCULAR:  []  chest pain, []  chest pressure, []  palpitations, []  shortness of breath when laying flat, []  shortness of breath with exertion,  []  pain in feet when walking, []  pain in feet when laying flat, []  history of blood clot in veins (DVT), []  history of phlebitis, [x]  swelling in legs, []  varicose veins  PULMONARY:  []  productive cough, []  asthma, []  wheezing  NEUROLOGIC:  []  weakness in arms or legs, []  numbness in arms or legs, []  difficulty speaking or slurred  speech, []  temporary loss of vision in one eye, []  dizziness  HEMATOLOGIC:  []  bleeding problems, []  problems with blood clotting too easily  MUSCULOSKEL:  []  joint pain, []  joint swelling  GASTROINTEST:  []  vomiting blood, []  blood in stool     GENITOURINARY:  []  burning with urination, []  blood in urine  PSYCHIATRIC:  []  history of major depression  INTEGUMENTARY:  []  rashes, []  ulcers  CONSTITUTIONAL:  []  fever, []  chills   Physical Examination Filed Vitals:   01/05/14 1428  BP: 139/80  Pulse: 72  Height: 5' 5.5" (1.664 m)  Weight: 150 lb 6.4 oz (68.221 kg)  SpO2: 100%   Body mass index is 24.64 kg/(m^2).  General: A&O x 3, WDWN  Head: [place]/AT  Ear/Nose/Throat: Hearing grossly intact, nares w/o erythema or drainage, oropharynx w/o Erythema/Exudate  Eyes: PERRLA, EOMI  Neck: Supple, no nuchal rigidity, no palpable LAD  Pulmonary: Sym exp, good air movt, CTAB, no rales, rhonchi, & wheezing  Cardiac: RRR, Nl S1, S2, no Murmurs, rubs or gallops  Vascular: Vessel Right Left  Radial Palpable Palpable  Brachial Palpable Palpable  Carotid Palpable, without bruit Palpable, without bruit  Aorta Not palpable N/A  Femoral Palpable Palpable  Popliteal Not palpable Not palpable  PT Faintly Palpable Faintly Palpable  DP Faintly Palpable Difficult to Palpate due to swelling    Gastrointestinal: soft, NTND, -G/R, - HSM, - masses, - CVAT B, healed incision in midline and left groin  Musculoskeletal: M/S 5/5 throughout,  Extremities without ischemic changes , L leg 3+ edema, mild foot cyanosis, warm foot   Neurologic: CN 2-12 intact , Pain and light touch intact in extremities , Motor exam as listed above  Psychiatric: Judgment intact, Mood & affect appropriate for pt's clinical situation  Dermatologic: See M/S exam for extremity exam, no rashes otherwise noted  Lymph : No Cervical, Axillary, or Inguinal lymphadenopathy   Non-Invasive Vascular Imaging  LLE Venous  Duplex (Date: 01/05/2014):   No flow in left proximal CFV.  No evidence of acute thrombus  Large collateral veins evident.  No DVT and SVT in rest of leg   Medical Decision Making  Megan Mahoney is a 68 y.o. 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  It is not clear to me the etiology of the increased  swelling in this patient's lower leg.  At some point, ascending venography may be needed to sort out this patient's swelling if it does not resolve with conservative measures.  At this point, I would continued with extreme elevation of the left leg.    Once the swelling improves, compressive therapy should be attempted with eventual compression stocking use if possible.  Obtaining her prior surgical record would be important helping determine the next step in this patient.  She may require referral back to [facility] as I suspect the current findings may be related to her prior sarcoma resections.    This patient is also s/p XRT to the left groin, so I doubt she is a candidate for further surgical intervention on that side.  Thank you for allowing [place] to participate in this patient's care.  Adele Barthel, MD [facility] Office: [phone] Pager: [phone]  01/05/2014, 3:59 PM

## 2014-01-09 ENCOUNTER — Telehealth (HOSPITAL_COMMUNITY): Payer: Self-pay | Admitting: *Deleted

## 2014-01-15 DIAGNOSIS — M7750 Other enthesopathy of unspecified foot: Secondary | ICD-10-CM | POA: Diagnosis not present

## 2014-01-18 ENCOUNTER — Encounter: Payer: Self-pay | Admitting: Vascular Surgery

## 2014-01-18 DIAGNOSIS — M7750 Other enthesopathy of unspecified foot: Secondary | ICD-10-CM | POA: Diagnosis not present

## 2014-01-19 ENCOUNTER — Encounter: Payer: Self-pay | Admitting: Vascular Surgery

## 2014-01-19 ENCOUNTER — Ambulatory Visit (INDEPENDENT_AMBULATORY_CARE_PROVIDER_SITE_OTHER): Payer: Medicare Other | Admitting: Vascular Surgery

## 2014-01-19 VITALS — BP 139/76 | HR 65 | Ht 65.5 in | Wt 146.1 lb

## 2014-01-19 DIAGNOSIS — I872 Venous insufficiency (chronic) (peripheral): Secondary | ICD-10-CM

## 2014-01-19 NOTE — Progress Notes (Signed)
    Established Venous Insufficiency  History of Present Illness  Megan Mahoney is a 68 y.o. (05-Sep-1945) female s/p multiple LLQ sarcoma resections who presents with chief complaint: L leg swelling.  The patient's symptoms have improved.  The patient has been elevating her left leg.  The patient's symptoms are: mild swelling in R leg.  The patient's treatment regimen currently included: maximal medical management.  This patient has not started compression stocking use.  The patient's PMH, PSH, SH, FamHx, Med, and Allergies are unchanged from 01/05/14.  On ROS today: swelling improved in L leg, no dyspnea, no leg pain  Physical Examination  Filed Vitals:   01/19/14 1104  BP: 139/76  Pulse: 65  Height: 5' 5.5" (1.664 m)  Weight: 146 lb 1.6 oz (66.271 kg)  SpO2: 100%   Body mass index is 23.93 kg/(m^2).  General: A&O x 3, WDWN  Pulmonary: Sym exp, good air movt, CTAB, no rales, rhonchi, & wheezing  Cardiac: RRR, Nl S1, S2, no Murmurs, rubs or gallops  Vascular: Vessel Right Left  Radial Palpable Palpable  Brachial Palpable Palpable  Carotid Palpable, without bruit Palpable, without bruit  Aorta Not palpable N/A  Femoral Palpable Palpable  Popliteal Not palpable Not palpable  PT Palpable Palpable  DP Palpable Palpable   Gastrointestinal: soft, NTND, -G/R, - HSM, - masses, - CVAT B  Musculoskeletal: M/S 5/5 throughout , Extremities without ischemic changes , LLE edema improved: 1+, no cyanosis today  Neurologic: Pain and light touch intact in extremities , Motor exam as listed above  Medical Decision Making  Megan Mahoney is a 68 y.o. 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    Based on the patient's vascular studies and examination, I have offered the patient: compressive therapy.  If she has a  recurrent acute swelling, normally I would recommend: repeat venous duplex.  If that venous duplex is unchanged, I recommend referral to [facility] for ascending venography.  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  As her procedures were done at [facility], it would better to continue her work-up at that facility, as I suspect input from her surgical oncologist would be important, as left leg swelling may be a manifestation of recurrent sarcoma compressing her more proximal vessels.  Thank you for allowing [place] to participate in this patient's care.  Adele Barthel, MD [facility] [place] Office: [phone] Pager: [phone]  01/19/2014, 12:30 PM

## 2014-01-25 DIAGNOSIS — M7750 Other enthesopathy of unspecified foot: Secondary | ICD-10-CM | POA: Diagnosis not present

## 2014-01-30 DIAGNOSIS — M7750 Other enthesopathy of unspecified foot: Secondary | ICD-10-CM | POA: Diagnosis not present

## 2014-02-01 DIAGNOSIS — M7750 Other enthesopathy of unspecified foot: Secondary | ICD-10-CM | POA: Diagnosis not present

## 2014-02-05 DIAGNOSIS — M7750 Other enthesopathy of unspecified foot: Secondary | ICD-10-CM | POA: Diagnosis not present

## 2014-02-08 DIAGNOSIS — M7750 Other enthesopathy of unspecified foot: Secondary | ICD-10-CM | POA: Diagnosis not present

## 2014-02-13 DIAGNOSIS — M7750 Other enthesopathy of unspecified foot: Secondary | ICD-10-CM | POA: Diagnosis not present

## 2014-02-20 ENCOUNTER — Other Ambulatory Visit: Payer: Self-pay | Admitting: Obstetrics and Gynecology

## 2014-02-20 NOTE — Telephone Encounter (Signed)
Last ov note said patient was weaning off medication. Will need phone call to assess status

## 2014-02-20 NOTE — Telephone Encounter (Signed)
Last AEX: 09/01/13 Last refill:09/04/13 #60, 5 rfs Current AEX:09/07/14  Please advise

## 2014-02-21 NOTE — Telephone Encounter (Signed)
Pt calling shannon back

## 2014-02-21 NOTE — Telephone Encounter (Signed)
Please check with Dr Quincy Simmonds since she saw this patient

## 2014-02-21 NOTE — Telephone Encounter (Signed)
S/w patient she is trying to wean off medication is down to taking the Effexor once daily. Has a "little bit left", she said she didn't take it for one day and she saw a difference. Would like to cut them in half and take them that way so she can wean off.  Please advise.

## 2014-02-21 NOTE — Telephone Encounter (Signed)
Please check with Dr Quincy Simmonds her patient

## 2014-02-21 NOTE — Telephone Encounter (Signed)
Called pt back. LMOM to contact office

## 2014-02-21 NOTE — Telephone Encounter (Signed)
LMOM (cell) to contact the office.

## 2014-02-22 NOTE — Telephone Encounter (Signed)
Routed to Dr Silva for review.  °

## 2014-02-26 ENCOUNTER — Other Ambulatory Visit: Payer: Self-pay | Admitting: Obstetrics and Gynecology

## 2014-02-26 MED ORDER — VENLAFAXINE HCL 25 MG PO TABS: ORAL_TABLET | ORAL | Status: DC

## 2014-02-26 NOTE — Telephone Encounter (Signed)
Dr. Quincy Simmonds did you get a chance to review this?

## 2014-02-26 NOTE — Telephone Encounter (Signed)
Ok for Effexor 25 mg, 1/2 tablet daily for two months and then stop.  #60, RF zero.  I will send this in to her pharmacy.

## 2014-02-27 NOTE — Telephone Encounter (Signed)
Left Message To Call Back  

## 2014-03-01 NOTE — Telephone Encounter (Signed)
Pt informed and voiced understanding. Encounter closed

## 2014-04-03 DIAGNOSIS — H35371 Puckering of macula, right eye: Secondary | ICD-10-CM | POA: Diagnosis not present

## 2014-04-16 DIAGNOSIS — M19072 Primary osteoarthritis, left ankle and foot: Secondary | ICD-10-CM | POA: Diagnosis not present

## 2014-04-16 DIAGNOSIS — M7672 Peroneal tendinitis, left leg: Secondary | ICD-10-CM | POA: Diagnosis not present

## 2014-04-19 ENCOUNTER — Telehealth: Payer: Self-pay | Admitting: Obstetrics and Gynecology

## 2014-04-19 NOTE — Telephone Encounter (Signed)
Left message upcoming appointment has been canceled and need to be rescheduled.

## 2014-04-23 NOTE — Telephone Encounter (Signed)
Rescheduled

## 2014-04-30 ENCOUNTER — Encounter: Payer: Self-pay | Admitting: Vascular Surgery

## 2014-06-27 DIAGNOSIS — Z87898 Personal history of other specified conditions: Secondary | ICD-10-CM | POA: Diagnosis not present

## 2014-06-27 DIAGNOSIS — L821 Other seborrheic keratosis: Secondary | ICD-10-CM | POA: Diagnosis not present

## 2014-06-27 DIAGNOSIS — D224 Melanocytic nevi of scalp and neck: Secondary | ICD-10-CM | POA: Diagnosis not present

## 2014-06-27 DIAGNOSIS — Z85828 Personal history of other malignant neoplasm of skin: Secondary | ICD-10-CM | POA: Diagnosis not present

## 2014-06-27 DIAGNOSIS — Z86018 Personal history of other benign neoplasm: Secondary | ICD-10-CM | POA: Diagnosis not present

## 2014-06-27 DIAGNOSIS — D485 Neoplasm of uncertain behavior of skin: Secondary | ICD-10-CM | POA: Diagnosis not present

## 2014-06-27 DIAGNOSIS — D229 Melanocytic nevi, unspecified: Secondary | ICD-10-CM | POA: Diagnosis not present

## 2014-08-22 ENCOUNTER — Other Ambulatory Visit: Payer: Self-pay | Admitting: Obstetrics and Gynecology

## 2014-08-22 DIAGNOSIS — Z1231 Encounter for screening mammogram for malignant neoplasm of breast: Secondary | ICD-10-CM

## 2014-08-27 ENCOUNTER — Ambulatory Visit (HOSPITAL_COMMUNITY)
Admission: RE | Admit: 2014-08-27 | Discharge: 2014-08-27 | Disposition: A | Discharge status: Home / Self Care | Payer: Medicare Other | Source: Ambulatory Visit | Attending: Obstetrics and Gynecology | Admitting: Obstetrics and Gynecology

## 2014-08-27 DIAGNOSIS — Z01419 Encounter for gynecological examination (general) (routine) without abnormal findings: Secondary | ICD-10-CM

## 2014-08-27 DIAGNOSIS — Z780 Asymptomatic menopausal state: Secondary | ICD-10-CM | POA: Insufficient documentation

## 2014-08-27 DIAGNOSIS — Z13820 Encounter for screening for osteoporosis: Secondary | ICD-10-CM | POA: Insufficient documentation

## 2014-08-27 DIAGNOSIS — M8589 Other specified disorders of bone density and structure, multiple sites: Secondary | ICD-10-CM | POA: Diagnosis not present

## 2014-08-27 DIAGNOSIS — M8580 Other specified disorders of bone density and structure, unspecified site: Secondary | ICD-10-CM

## 2014-08-29 ENCOUNTER — Telehealth: Payer: Self-pay | Admitting: Emergency Medicine

## 2014-08-29 NOTE — Telephone Encounter (Signed)
Patient returned call and message from Dr. Quincy Simmonds given.   She has ulcerative colitis and has stopped taking her calcium/magnesium and vitamin D supplement. It was causing her constipation. Patient is going to research her options on sources of calcium that she can take with her medical conditions and discuss this with Dr. Quincy Simmonds at her annual exam.   Routing to provider for final review. Patient agreeable to disposition. Will close encounter

## 2014-08-29 NOTE — Telephone Encounter (Signed)
-----   Message from Farnam, MD sent at 08/28/2014  5:17 AM EST ----- Please inform patient of bone density showing osteopenia of the hip and forearm.  Prior studies were done likely at Dr. Josie Dixon office.  I recommend daily calcium and vitamin D through dietary sources, if possible 5 servings per day. Weight bearing exercise of the upper and lower limbs is also recommended. She will need another bone density in 2 years.  I will see her for her annual exam this month.   Thanks!

## 2014-08-29 NOTE — Telephone Encounter (Signed)
Message left to return call to Megan Mahoney at [phone].    

## 2014-09-07 ENCOUNTER — Ambulatory Visit: Payer: Medicare Other | Admitting: Obstetrics and Gynecology

## 2014-09-07 ENCOUNTER — Encounter: Payer: Self-pay | Admitting: Obstetrics and Gynecology

## 2014-09-07 ENCOUNTER — Ambulatory Visit (INDEPENDENT_AMBULATORY_CARE_PROVIDER_SITE_OTHER): Payer: Medicare Other | Admitting: Obstetrics and Gynecology

## 2014-09-07 VITALS — BP 130/84 | HR 66 | Resp 14 | Ht 65.5 in | Wt 152.8 lb

## 2014-09-07 DIAGNOSIS — Z01419 Encounter for gynecological examination (general) (routine) without abnormal findings: Secondary | ICD-10-CM | POA: Diagnosis not present

## 2014-09-07 DIAGNOSIS — N9089 Other specified noninflammatory disorders of vulva and perineum: Secondary | ICD-10-CM | POA: Diagnosis not present

## 2014-09-07 DIAGNOSIS — M8580 Other specified disorders of bone density and structure, unspecified site: Secondary | ICD-10-CM

## 2014-09-07 NOTE — Progress Notes (Signed)
Patient ID: Megan Mahoney, female   DOB: [birth year], 69 y.o.   MRN: [id number] 69 y.o. [id number] SingleCaucasianF here for annual exam.   TAH/BSO.  Now is off Effexor and doing well.  Rare night sweat.  Not sleeping well.  Not often.  Using midnight menopause - cam[place]ille, lavendar.  Not doing vigorous exercise.   Irritation of vulva with KY jelly use/sexually activity.   Has ulcerative colitis.  Medication is helping.   PCP:  Milagros Evener, MD  No LMP recorded. Patient has had a hysterectomy.          Sexually active: No. female partner The current method of family planning is status post hysterectomy.    Exercising: No.  none. Smoker:  no  Health Maintenance: Pap:  07-17-12 wnl:neg HR HPV History of abnormal Pap:  no MMG:  09-25-13 extremely dense/nl:[facility] Colonoscopy:  May 2015? BMD:   08-27-14 Osteopenia of hip and forearm:[facility] 2 years TDaP:  2009 Screening Labs:  Hb today: PCP, Urine today: PCP   reports that she has never smoked. She does not have any smokeless tobacco history on file. She reports that she does not drink alcohol or use illicit drugs.  Past Medical History  Diagnosis Date  . Osteopenia   . History of breast cancer   . Cancer 1997    Rt. breast ca  . Blood transfusion without reported diagnosis 1970's    multiple--following desmoid tumor surgery  . Heart murmur   . Ulcerative colitis   . Heart murmur   . Sarcoma of lower extremity   . Tendonitis of ankle, left     Past Surgical History  Procedure Laterality Date  . Nephrectomy  1979  . Abdominal hysterectomy  1979    TAH/BSO  . Breast surgery  1997    Rt. mastectomy/reconstruction  . Melanoma removal  2002    --rt. thigh  . Desmoid tumor      --left groin--s/p five resections  . Sigmoid resection / rectopexy    . Tonsillectomy and adenoidectomy    . Bunionectomy Right     Current Outpatient Prescriptions  Medication Sig Dispense Refill  . mesalamine  (LIALDA) 1.2 G EC tablet Take 1.2 g by mouth daily with breakfast.    . Multiple Vitamin (MULTIVITAMIN) capsule Take 1 capsule by mouth daily.     No current facility-administered medications for this visit.    Family History  Problem Relation Age of Onset  . Ovarian cancer Sister 57    dec  . Cancer Sister   . Cancer Maternal Grandmother 90    colon  . Transient ischemic attack Maternal Grandmother   . Breast cancer Paternal Grandmother 35  . Deep vein thrombosis Mother   . Cancer Father   . Hypertension Father     ROS:  Pertinent items are noted in HPI.  Otherwise, a comprehensive ROS was negative.  Exam:   BP 130/84 mmHg  Pulse 66  Resp 14  Ht 5' 5.5" (1.664 m)  Wt 152 lb 12.8 oz (69.31 kg)  BMI 25.03 kg/m2      Height: 5' 5.5" (166.4 cm)  Ht Readings from Last 3 Encounters:  09/07/14 5' 5.5" (1.664 m)  01/19/14 5' 5.5" (1.664 m)  01/05/14 5' 5.5" (1.664 m)    General appearance: alert, cooperative and appears stated age Head: Normocephalic, without obvious abnormality, atraumatic Neck: no adenopathy, supple, symmetrical, trachea midline and thyroid normal to inspection and palpation Lungs: clear to auscultation  bilaterally Breasts: Right breast absent and implant present.  Left breast with scar, otherwise, no masses, skin retractions, nipple discharge, or axillary adenopathy.  Heart: regular rate and rhythm Abdomen: multiple abdominal scars, soft, non-tender; bowel sounds normal; no masses,  no organomegaly Extremities: extremities normal, atraumatic, no cyanosis or edema of right lower extremity.  Compression stocking of left lower extremity.  Skin: Skin color, texture, turgor normal. No rashes or lesions Lymph nodes: Cervical, supraclavicular, and axillary nodes normal. No abnormal inguinal nodes palpated Neurologic: Grossly normal   Pelvic: External genitalia:  hypopigmentation of vulva.               Urethra:  normal appearing urethra with no masses,  tenderness or lesions              Bartholins and Skenes: normal                 Vagina: normal appearing vagina with normal color and discharge, no lesions              Cervix: absent              Pap taken: No. Bimanual Exam:  Uterus:  uterus absent              Adnexa: normal adnexa and no mass, fullness, tenderness               Rectovaginal: Confirms               Anus:  normal sphincter tone, no lesions  Chaperone was present for exam.  A:  Well Woman with normal exam Status post TAH/BSO. Status post right mastectomy for breast cancer. Osteopenia.  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  P:   Mammogram yearly.  pap smear not indicated.  Discussed Ca/Vit D/weight bearing exercise.  Bone density in 2 years.  Return for vulvar biopsy.  i expect Rx for clobetasol. return annually or prn  Additional 15 minutes discussing osteopenia and vulva skin changes and biopsy/treatment.  Over 50% was spent in counseling.   After visit summary to patient.

## 2014-09-10 ENCOUNTER — Telehealth: Payer: Self-pay | Admitting: Obstetrics and Gynecology

## 2014-09-10 NOTE — Telephone Encounter (Signed)
Call to patient. Advised of benefit quote received for biopsy vulvar.  Patient agreeable. Scheduled procedure.

## 2014-09-12 DIAGNOSIS — L089 Local infection of the skin and subcutaneous tissue, unspecified: Secondary | ICD-10-CM | POA: Diagnosis not present

## 2014-09-12 DIAGNOSIS — D224 Melanocytic nevi of scalp and neck: Secondary | ICD-10-CM | POA: Diagnosis not present

## 2014-09-13 ENCOUNTER — Ambulatory Visit (INDEPENDENT_AMBULATORY_CARE_PROVIDER_SITE_OTHER): Payer: Medicare Other | Admitting: Obstetrics and Gynecology

## 2014-09-13 ENCOUNTER — Encounter: Payer: Self-pay | Admitting: Obstetrics and Gynecology

## 2014-09-13 DIAGNOSIS — N904 Leukoplakia of vulva: Secondary | ICD-10-CM | POA: Diagnosis not present

## 2014-09-13 DIAGNOSIS — N9089 Other specified noninflammatory disorders of vulva and perineum: Secondary | ICD-10-CM

## 2014-09-13 NOTE — Patient Instructions (Signed)

## 2014-09-13 NOTE — Progress Notes (Addendum)
Patient ID: Megan Mahoney, female   DOB: [birth year], 69 y.o.   MRN: [id number] GYNECOLOGY  VISIT   HPI: 69 y.o.   Single  Caucasian  female   G2P2002 with No LMP recorded. Patient has had a hysterectomy.   here for vulvar biopsy.  Patient having itching and irritation.   GYNECOLOGIC HISTORY: No LMP recorded. Patient has had a hysterectomy. Contraception:   hysterectomy Menopausal hormone therapy: none        OB History    Gravida Para Term Preterm AB TAB SAB Ectopic Multiple Living   2 2 2       2          Patient Active Problem List   Diagnosis Date Noted  . Chronic venous insufficiency 01/05/2014    Past Medical History  Diagnosis Date  . Osteopenia   . History of breast cancer   . Cancer 1997    Rt. breast ca  . Blood transfusion without reported diagnosis 1970's    multiple--following desmoid tumor surgery  . Heart murmur   . Ulcerative colitis   . Heart murmur   . Sarcoma of lower extremity   . Tendonitis of ankle, left     Past Surgical History  Procedure Laterality Date  . Nephrectomy  1979  . Abdominal hysterectomy  1979    TAH/BSO  . Breast surgery  1997    Rt. mastectomy/reconstruction  . Melanoma removal  2002    --rt. thigh  . Desmoid tumor      --left groin--s/p five resections  . Sigmoid resection / rectopexy    . Tonsillectomy and adenoidectomy    . Bunionectomy Right     Current Outpatient Prescriptions  Medication Sig Dispense Refill  . mesalamine (LIALDA) 1.2 G EC tablet Take 1.2 g by mouth daily with breakfast.    . Multiple Vitamin (MULTIVITAMIN) capsule Take 1 capsule by mouth daily.     No current facility-administered medications for this visit.     ALLERGIES: Codeine  Family History  Problem Relation Age of Onset  . Ovarian cancer Sister 21    dec  . Cancer Sister   . Cancer Maternal Grandmother 90    colon  . Transient ischemic attack Maternal Grandmother   . Breast cancer Paternal Grandmother 66  . Deep vein  thrombosis Mother   . Cancer Father   . Hypertension Father     History   Social History  . Marital Status: Single    Spouse Name: N/A  . Number of Children: N/A  . Years of Education: N/A   Occupational History  . Not on file.   Social History Main Topics  . Smoking status: Never Smoker   . Smokeless tobacco: Not on file  . Alcohol Use: No  . Drug Use: No  . Sexual Activity: No     Comment: TAH/BSO   Other Topics Concern  . Not on file   Social History Narrative    ROS:  Pertinent items are noted in HPI.  PHYSICAL EXAMINATION:    BP 122/72 mmHg  Pulse 68  Ht 5' 5.5" (1.664 m)  Wt 152 lb (68.947 kg)  BMI 24.90 kg/m2     General appearance: alert, cooperative and appears stated age   Pelvic: External genitalia:  Hypopigmentation of bilateral labia minora and perineal body.   Vulvar biopsy.  Consent for procedure.  Sterile prep with Hibiclens.  Local 1% lidocaine to superior left labia minor and right perineal body.  Lot  number [id number], Expiration 11/28/14. 3 mm punch biopsy used for biopsy of superior left labia minora and right perineal body.   Specimens marked and sent to pathology separately.  AgNO3 used to hemostasis.  Perineum washed with warm water due to burning sensation from Hibiclens. Symptoms much improved after this.  No complications to procedure.  Minimal EBL.                 ASSESSMENT  Vulvar lesions.  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  PLAN  Discussion of lichen sclerosus with patient.  I anticipated use of Clobetasol ointment bid follow return of biopsy results.  Explanation of proper use given to patient.  Instructions and precautions given regarding biopsy sites.  Next appointment likely in 4 weeks for a recheck.    An After Visit Summary was printed and given to the patient.  _10_____ minutes face to face time of which over 50% was spent in counseling.

## 2014-09-17 LAB — IPS OTHER TISSUE BIOPSY

## 2014-09-19 ENCOUNTER — Telehealth: Payer: Self-pay

## 2014-09-19 MED ORDER — CLOBETASOL PROPIONATE 0.05 % EX OINT: 1.0000 "application " | TOPICAL_OINTMENT | Freq: Two times a day (BID) | CUTANEOUS | Status: DC

## 2014-09-19 NOTE — Telephone Encounter (Signed)
-----   Message from Nunzio Cobbs, MD sent at 09/17/2014  6:53 PM EDT ----- Please inform patient of skin pathology results showing lichen sclerosus, which is what the patient and I were expecting. There was no sign of precancer or cancerous cells.   I will treat with Clobetasol 0.05% ointment bid to the affected areas. I have already discussed this treatment plan as well.  Please send it to the pharmacy of her choice.  She will need a recheck with me in 4 weeks.  Cc- Marisa Sprinkles

## 2014-09-19 NOTE — Telephone Encounter (Signed)
Left message to call Kaitlyn at [phone]. 

## 2014-09-19 NOTE — Telephone Encounter (Signed)
Spoke with patient. Results given as seen below. Patient is agreeable. 1 month follow up scheduled for 10/18/2014 at 2:45pm with Dr.Silva. Patient is agreeable to date and time. Clobetasol 0.05% ointment sent to [facility] on file. Patient is agreeable.  Routing to provider for final review. Patient agreeable to disposition. Will close encounter

## 2014-09-27 ENCOUNTER — Ambulatory Visit (HOSPITAL_COMMUNITY)
Admission: RE | Admit: 2014-09-27 | Discharge: 2014-09-27 | Disposition: A | Discharge status: Home / Self Care | Payer: Medicare Other | Source: Ambulatory Visit | Attending: Obstetrics and Gynecology | Admitting: Obstetrics and Gynecology

## 2014-09-27 ENCOUNTER — Other Ambulatory Visit: Payer: Self-pay | Admitting: Obstetrics and Gynecology

## 2014-09-27 DIAGNOSIS — Z1231 Encounter for screening mammogram for malignant neoplasm of breast: Secondary | ICD-10-CM

## 2014-10-18 ENCOUNTER — Encounter: Payer: Self-pay | Admitting: Obstetrics and Gynecology

## 2014-10-18 ENCOUNTER — Ambulatory Visit (INDEPENDENT_AMBULATORY_CARE_PROVIDER_SITE_OTHER): Payer: Medicare Other | Admitting: Obstetrics and Gynecology

## 2014-10-18 VITALS — BP 120/82 | HR 70 | Ht 65.5 in | Wt 155.4 lb

## 2014-10-18 DIAGNOSIS — N904 Leukoplakia of vulva: Secondary | ICD-10-CM | POA: Diagnosis not present

## 2014-10-18 NOTE — Progress Notes (Signed)
Patient ID: Megan Mahoney, female   DOB: 1946/03/10, 69 y.o.   MRN: [id number] GYNECOLOGY  VISIT   HPI: 69 y.o.   Single  Caucasian  female   G2P2002 with No LMP recorded. Patient has had a hysterectomy.   here for 4 week follow up.  Using Clobetsol 0.5% ointment twice daily for 4 weeks for lichen sclerosus.  Skin feels sensitized but does not notice improvement.  Putting ointment on more lateral vulva. No itching.  No burning.   Has burning feeling with KY jelly.  Has a history of breast cancer.  Stopped vaginal estrogen when she received her diagnosis.    GYNECOLOGIC HISTORY: No LMP recorded. Patient has had a hysterectomy. Contraception:  hysterectomy  Menopausal hormone therapy: none  Last pap:  07-17-12 wnl:neg HR HPV Last mammogram:  09-27-14 Unilateral left mammogram: extremely dense/nl; rt. [facility]aste[facility]        OB History    Gravida Para Term Preterm AB TAB SAB Ectopic Multiple Living   2 2 2       2          Patient Active Problem List   Diagnosis Date Noted  . Chronic venous insufficiency 01/05/2014    Past Medical History  Diagnosis Date  . Osteopenia   . History of breast cancer   . Cancer 1997    Rt. breast ca  . Blood transfusion without reported diagnosis 1970's    multiple--following desmoid tumor surgery  . Heart murmur   . Ulcerative colitis   . Heart murmur   . Sarcoma of lower extremity   . Tendonitis of ankle, left   . Lichen sclerosus     --vulva    Past Surgical History  Procedure Laterality Date  . Nephrectomy  1979  . Abdominal hysterectomy  1979    TAH/BSO  . Breast surgery  1997    Rt. mastectomy/reconstruction  . Melanoma removal  2002    --rt. thigh  . Desmoid tumor      --left groin--s/p five resections  . Sigmoid resection / rectopexy    . Tonsillectomy and adenoidectomy    . Bunionectomy Right     Current Outpatient Prescriptions  Medication Sig Dispense Refill  . clobetasol ointment (TEMOVATE) 5.17  % Apply 1 application topically 2 (two) times daily. 30 g 0  . docusate sodium (COLACE) 100 MG capsule Take 100 mg by mouth 2 (two) times daily.    . mesalamine (LIALDA) 1.2 G EC tablet Take 1.2 g by mouth daily with breakfast.    . Multiple Vitamin (MULTIVITAMIN) capsule Take 1 capsule by mouth daily.     No current facility-administered medications for this visit.     ALLERGIES: Codeine  Family History  Problem Relation Age of Onset  . Ovarian cancer Sister 80    dec  . Cancer Sister   . Cancer Maternal Grandmother 90    colon  . Transient ischemic attack Maternal Grandmother   . Breast cancer Paternal Grandmother 3  . Deep vein thrombosis Mother   . Cancer Father   . Hypertension Father     History   Social History  . Marital Status: Single    Spouse Name: N/A  . Number of Children: N/A  . Years of Education: N/A   Occupational History  . Not on file.   Social History Main Topics  . Smoking status: Never Smoker   . Smokeless tobacco: Not on file  . Alcohol Use: No  .  Drug Use: No  . Sexual Activity: No     Comment: TAH/BSO   Other Topics Concern  . Not on file   Social History Narrative    ROS:  Pertinent items are noted in HPI.  PHYSICAL EXAMINATION:    BP 120/82 mmHg  Pulse 70  Ht 5' 5.5" (1.664 m)  Wt 155 lb 6.4 oz (70.489 kg)  BMI 25.46 kg/m2     General appearance: alert, cooperative and appears stated age Lungs: clear to auscultation bilaterally Heart: regular rate and rhythm Abdomen: soft, non-tender; no masses,  no organomegaly No abnormal inguinal nodes palpated  Pelvic: External genitalia:   Excoriation of the medial right labia minora.  Hypopigmentation of bilateral labia minora with fusion in the midline.  Hypopigmentation of the perineum.             ASSESSMENT  Lichen sclerosus.  No real improvement.   History of breast cancer.  Atrophic changes.   PLAN  Continue clobetasol 0.05% twice daily to vulva.  Reviewed where and  how to apply.  Discussed avoiding irritants.  Recheck in 1 month.  Discussed possible local vaginal estrogen therapy for the future.    An After Visit Summary was printed and given to the patient.  __15____ minutes face to face time of which over 50% was spent in counseling.

## 2014-11-07 DIAGNOSIS — K5289 Other specified noninfective gastroenteritis and colitis: Secondary | ICD-10-CM | POA: Diagnosis not present

## 2014-11-15 ENCOUNTER — Encounter: Payer: Self-pay | Admitting: Obstetrics and Gynecology

## 2014-11-15 ENCOUNTER — Ambulatory Visit (INDEPENDENT_AMBULATORY_CARE_PROVIDER_SITE_OTHER): Payer: Medicare Other | Admitting: Obstetrics and Gynecology

## 2014-11-15 VITALS — BP 130/74 | HR 78 | Ht 65.5 in | Wt 156.0 lb

## 2014-11-15 DIAGNOSIS — L900 Lichen sclerosus et atrophicus: Secondary | ICD-10-CM | POA: Diagnosis not present

## 2014-11-15 NOTE — Progress Notes (Signed)
GYNECOLOGY  VISIT   HPI: 69 y.o.   Single  Caucasian  female   G2P2002 with No LMP recorded. Patient has had a hysterectomy.   here for  1 month recheck of lichen sclerosus.  Vulva is feeling better.  Feels the tissue is more plump. Has been using clobetasol twice a day.  Still has a refill remaining.  Uses one tube a month.   States has hemorrhoids and bottom is sore.  Some fecal soiling.   Does use aloe wipes on her vulva and Preparation H wipes around the rectum.  They do not help her. GYNECOLOGIC HISTORY: No LMP recorded. Patient has had a hysterectomy. Contraception: Hysterectomy  Menopausal hormone therapy: None Last mammogram: 09/28/14 bi-rads 1: negative Last pap smear:  07/27/12 wnl neg hr hpv         OB History    Gravida Para Term Preterm AB TAB SAB Ectopic Multiple Living   2 2 2       2          Patient Active Problem List   Diagnosis Date Noted  . Chronic venous insufficiency 01/05/2014    Past Medical History  Diagnosis Date  . Osteopenia   . History of breast cancer   . Cancer 1997    Rt. breast ca  . Blood transfusion without reported diagnosis 1970's    multiple--following desmoid tumor surgery  . Heart murmur   . Ulcerative colitis   . Heart murmur   . Sarcoma of lower extremity   . Tendonitis of ankle, left   . Lichen sclerosus     --vulva    Past Surgical History  Procedure Laterality Date  . Nephrectomy  1979  . Abdominal hysterectomy  1979    TAH/BSO  . Breast surgery  1997    Rt. mastectomy/reconstruction  . Melanoma removal  2002    --rt. thigh  . Desmoid tumor      --left groin--s/p five resections  . Sigmoid resection / rectopexy    . Tonsillectomy and adenoidectomy    . Bunionectomy Right     Current Outpatient Prescriptions  Medication Sig Dispense Refill  . clobetasol ointment (TEMOVATE) 9.64 % Apply 1 application topically 2 (two) times daily. 30 g 0  . docusate sodium (COLACE) 100 MG capsule Take 100 mg by mouth 2  (two) times daily.    . mesalamine (LIALDA) 1.2 G EC tablet Take 1.2 g by mouth daily with breakfast.    . Multiple Vitamin (MULTIVITAMIN) capsule Take 1 capsule by mouth daily.     No current facility-administered medications for this visit.     ALLERGIES: Codeine  Family History  Problem Relation Age of Onset  . Ovarian cancer Sister 55    dec  . Cancer Sister   . Cancer Maternal Grandmother 90    colon  . Transient ischemic attack Maternal Grandmother   . Breast cancer Paternal Grandmother 73  . Deep vein thrombosis Mother   . Cancer Father   . Hypertension Father     History   Social History  . Marital Status: Single    Spouse Name: N/A  . Number of Children: N/A  . Years of Education: N/A   Occupational History  . Not on file.   Social History Main Topics  . Smoking status: Never Smoker   . Smokeless tobacco: Not on file  . Alcohol Use: No  . Drug Use: No  . Sexual Activity: No     Comment: TAH/BSO  Other Topics Concern  . Not on file   Social History Narrative    ROS:  Pertinent items are noted in HPI.  PHYSICAL EXAMINATION:    BP 130/74 mmHg  Pulse 78  Ht 5' 5.5" (1.664 m)  Wt 156 lb (70.761 kg)  BMI 25.56 kg/m2    General appearance: alert, cooperative and appears stated age   Pelvic: External genitalia:  Labia less white in color.  Still has small petechial hemorrhages and sensitivity of the left labia minora.              Urethra:  normal appearing urethra with no masses, tenderness or lesions                        Anus:  White epithelium with cracking around anal opening.   Chaperone was present for exam.  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  PLAN  Counseled regarding changes of perianal epithelium. Use Clobetasol 0.05% to vulva and then perianal region twice a day for one month.  Use aloe wipes for vulva and anal region.  Stop the Preparation H wipes.  Use only hypoallergenic  sanitary products.  Return in one month for a recheck.    An After Visit Summary was printed and given to the patient.  ___15___ minutes face to face time of which over 50% was spent in counseling.

## 2014-11-15 NOTE — Patient Instructions (Signed)
Use the Clobetasol ointment on the vulva twice a day and then around the perianal region twice a day for the next month.  This should make the anal area so much more comfortable also. The tissue there looks just like the lichen sclerosus of the vulva.

## 2014-12-03 ENCOUNTER — Other Ambulatory Visit: Payer: Self-pay | Admitting: Obstetrics and Gynecology

## 2014-12-03 NOTE — Telephone Encounter (Signed)
Medication refill request: Clobetasol 0.05 % Last AEX:  09/07/14 with BS Next AEX: 09/19/15 with BS  Last MMG (if hormonal medication request): 09/28/14 bi-rads 1: negative Refill authorized: Please advise.

## 2015-01-02 ENCOUNTER — Encounter: Payer: Self-pay | Admitting: Obstetrics and Gynecology

## 2015-01-02 ENCOUNTER — Ambulatory Visit (INDEPENDENT_AMBULATORY_CARE_PROVIDER_SITE_OTHER): Payer: Medicare Other | Admitting: Obstetrics and Gynecology

## 2015-01-02 VITALS — BP 120/76 | HR 66 | Ht 65.5 in | Wt 152.0 lb

## 2015-01-02 DIAGNOSIS — L900 Lichen sclerosus et atrophicus: Secondary | ICD-10-CM

## 2015-01-02 NOTE — Progress Notes (Signed)
GYNECOLOGY  VISIT   HPI: 69 y.o.   Single  Caucasian  female   G2P2002 with No LMP recorded. Patient has had a hysterectomy.   here for 1 month recheck of lichen sclerosus. Using the Clobetasol bid near the rectal area. No breaks in the skin.  No burning or itching in the skin.  Tried not to use pads at all. Bottom is feeling much better overall.   GYNECOLOGIC HISTORY: No LMP recorded. Patient has had a hysterectomy. Contraception: hysterectomy Menopausal hormone therapy: none Last mammogram: 09/28/14 Density Cat D bi-rads 1 neg/[facility] Last pap smear: 07/27/12 wnl neg hr hpv.        OB History    Gravida Para Term Preterm AB TAB SAB Ectopic Multiple Living   2 2 2       2          Patient Active Problem List   Diagnosis Date Noted  . Chronic venous insufficiency 01/05/2014    Past Medical History  Diagnosis Date  . Osteopenia   . History of breast cancer   . Cancer 1997    Rt. breast ca  . Blood transfusion without reported diagnosis 1970's    multiple--following desmoid tumor surgery  . Heart murmur   . Ulcerative colitis   . Heart murmur   . Sarcoma of lower extremity   . Tendonitis of ankle, left   . Lichen sclerosus     --vulva    Past Surgical History  Procedure Laterality Date  . Nephrectomy  1979  . Abdominal hysterectomy  1979    TAH/BSO  . Breast surgery  1997    Rt. mastectomy/reconstruction  . Melanoma removal  2002    --rt. thigh  . Desmoid tumor      --left groin--s/p five resections  . Sigmoid resection / rectopexy    . Tonsillectomy and adenoidectomy    . Bunionectomy Right     Current Outpatient Prescriptions  Medication Sig Dispense Refill  . clobetasol ointment (TEMOVATE) 2.11 % APPLY 1 APPLICATION TOPICALLY 2 (TWO) TIMES DAILY. 30 g 1  . docusate sodium (COLACE) 100 MG capsule Take 100 mg by mouth 2 (two) times daily.    . mesalamine (LIALDA) 1.2 G EC tablet Take 1.2 g by mouth daily with breakfast.    . Multiple Vitamin  (MULTIVITAMIN) capsule Take 1 capsule by mouth daily.     No current facility-administered medications for this visit.     ALLERGIES: Codeine  Family History  Problem Relation Age of Onset  . Ovarian cancer Sister 22    dec  . Cancer Sister   . Cancer Maternal Grandmother 90    colon  . Transient ischemic attack Maternal Grandmother   . Breast cancer Paternal Grandmother 44  . Deep vein thrombosis Mother   . Cancer Father   . Hypertension Father     History   Social History  . Marital Status: Single    Spouse Name: N/A  . Number of Children: N/A  . Years of Education: N/A   Occupational History  . Not on file.   Social History Main Topics  . Smoking status: Never Smoker   . Smokeless tobacco: Not on file  . Alcohol Use: No  . Drug Use: No  . Sexual Activity: No     Comment: TAH/BSO   Other Topics Concern  . Not on file   Social History Narrative    ROS:  Pertinent items are noted in HPI.  PHYSICAL  EXAMINATION:    BP 120/76 mmHg  Pulse 66  Ht 5' 5.5" (1.664 m)  Wt 152 lb (68.947 kg)  BMI 24.90 kg/m2    General appearance: alert, cooperative and appears stated age  Pelvic: External genitalia:  no lesions. No ulceration.  Pinkish change to skin of the vulva.               Left varicose vein of the left labia minora.  Visible veins of the bilateral labia.              Urethra:  normal appearing urethra with no masses, tenderness or lesions                           Anus:  normal sphincter tone, no lesions.  Less whitish tissue around the anal opening.   Chaperone was present for exam.  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  Responding well to clobetasol ointment.  Vulvar minor varicosity.   PLAN   Decrease use of Clobetasol 0.05% to once daily three times a week.  Discussed vulvar varicosity and changes expected due to multiple surgeries for groin desmoid tumor. Follow up prn.    An After Visit Summary was printed and  given to the patient.  __15____ minutes face to face time of which over 50% was spent in counseling.

## 2015-01-02 NOTE — Patient Instructions (Signed)
Lichen Sclerosus Lichen sclerosus is a skin problem. It can happen on any part of the body, but it commonly involves the anal or genital areas. Lichen sclerosus is not an infection or a fungus. Girls and women are more commonly affected than boys and men. CAUSES The cause is not known. It could be the result of an overactive immune system or a lack of certain hormones. Lichen sclerosus is not passed from one person to another (not contagious). SYMPTOMS Your skin may have:  Thin, wrinkled, white areas.  Thickened white areas.  Red and swollen patches.  Tears or cracks.  Bruising.  Blood blisters.  Severe itching. You may also have pain, itching, or burning with urination. Constipation is also common in people with lichen sclerosus. DIAGNOSIS Your caregiver will do a physical exam. Sometimes, a tissue sample (biopsy) may be sent for testing. TREATMENT Treatment may involve putting a thin layer of medicated cream (topical steroid) over the areas with lichen sclerosus. Use the cream only as directed by your caregiver.  HOME CARE INSTRUCTIONS  Only take over-the-counter or prescription medicines as directed by your caregiver.  Keep the vaginal area as clean and dry as possible. SEEK MEDICAL CARE IF: You develop increasing pain, swelling, or redness. Document Released: 11/05/2010 Document Revised: 09/07/2011 Document Reviewed: 11/05/2010 ExitCare Patient Information 2015 [employer]. This information is not intended to replace advice given to you by your health care provider. Make sure you discuss any questions you have with your health care provider.  

## 2015-03-22 DIAGNOSIS — H35371 Puckering of macula, right eye: Secondary | ICD-10-CM | POA: Diagnosis not present

## 2015-05-29 DIAGNOSIS — Z23 Encounter for immunization: Secondary | ICD-10-CM | POA: Diagnosis not present

## 2015-05-29 DIAGNOSIS — Z0000 Encounter for general adult medical examination without abnormal findings: Secondary | ICD-10-CM | POA: Diagnosis not present

## 2015-05-29 DIAGNOSIS — K5289 Other specified noninfective gastroenteritis and colitis: Secondary | ICD-10-CM | POA: Diagnosis not present

## 2015-05-29 DIAGNOSIS — E7800 Pure hypercholesterolemia, unspecified: Secondary | ICD-10-CM | POA: Diagnosis not present

## 2015-06-11 DIAGNOSIS — J22 Unspecified acute lower respiratory infection: Secondary | ICD-10-CM | POA: Diagnosis not present

## 2015-09-02 DIAGNOSIS — L814 Other melanin hyperpigmentation: Secondary | ICD-10-CM | POA: Diagnosis not present

## 2015-09-02 DIAGNOSIS — Z85828 Personal history of other malignant neoplasm of skin: Secondary | ICD-10-CM | POA: Diagnosis not present

## 2015-09-02 DIAGNOSIS — Z87898 Personal history of other specified conditions: Secondary | ICD-10-CM | POA: Diagnosis not present

## 2015-09-02 DIAGNOSIS — Z23 Encounter for immunization: Secondary | ICD-10-CM | POA: Diagnosis not present

## 2015-09-02 DIAGNOSIS — D485 Neoplasm of uncertain behavior of skin: Secondary | ICD-10-CM | POA: Diagnosis not present

## 2015-09-02 DIAGNOSIS — L821 Other seborrheic keratosis: Secondary | ICD-10-CM | POA: Diagnosis not present

## 2015-09-02 DIAGNOSIS — Z86018 Personal history of other benign neoplasm: Secondary | ICD-10-CM | POA: Diagnosis not present

## 2015-09-02 DIAGNOSIS — D225 Melanocytic nevi of trunk: Secondary | ICD-10-CM | POA: Diagnosis not present

## 2015-09-19 ENCOUNTER — Ambulatory Visit (INDEPENDENT_AMBULATORY_CARE_PROVIDER_SITE_OTHER): Payer: Medicare Other | Admitting: Obstetrics and Gynecology

## 2015-09-19 ENCOUNTER — Encounter: Payer: Self-pay | Admitting: Obstetrics and Gynecology

## 2015-09-19 DIAGNOSIS — Z124 Encounter for screening for malignant neoplasm of cervix: Secondary | ICD-10-CM | POA: Diagnosis not present

## 2015-09-19 DIAGNOSIS — Z01419 Encounter for gynecological examination (general) (routine) without abnormal findings: Secondary | ICD-10-CM | POA: Diagnosis not present

## 2015-09-19 DIAGNOSIS — M8580 Other specified disorders of bone density and structure, unspecified site: Secondary | ICD-10-CM | POA: Diagnosis not present

## 2015-09-19 DIAGNOSIS — L900 Lichen sclerosus et atrophicus: Secondary | ICD-10-CM

## 2015-09-19 MED ORDER — CLOBETASOL PROPIONATE 0.05 % EX OINT: TOPICAL_OINTMENT | CUTANEOUS | Status: DC

## 2015-09-19 NOTE — Progress Notes (Signed)
Patient ID: Megan Mahoney, female   DOB: [birth year], 70 y.o.   MRN: [id number]  70 y.o. G46P2002 Single Caucasian female here 70 y.o. G46P2002 Single Caucasian female here for annual exam.    Bronchitis this year and also had the flu. Notes urinary incontinence with coughing while sick. No leak with sneeze or laugh.   Has lichen sclerosus.  Using clobetasol one application, 2 x per week.   Having arthritis issues and takes Tylenol daily.   Father died in 05/23/2023 from pneumonia after having a fall. Working to settle his estate.  PCP:   [place] Rankins.   No LMP recorded. Patient has had a hysterectomy.          Sexually active: No.  The current method of family planning is status post hysterectomy.    Exercising: No.    Smoker:  no  Health Maintenance: Pap:  07-17-12 wnl:neg HR HPV History of abnormal Pap:  no MMG:  09/28/14 MMG 09/28/14 Bi-rads 1 Cat D Breast Density  hx R Breast Mastectomy.  Patient will schedule.  Colonoscopy:  4-5 years ago with Dr.John Edwards BMD:  BMD 08/27/14 Result  Osteopenia of hip and forearm  TDaP:  2009 with PCP Screening Labs:  With PCP  Hb today: PCP, Urine today: PCP.   reports that she has never smoked. She does not have any smokeless tobacco history on file. She reports that she does not drink alcohol or use illicit drugs.  Past Medical History  Diagnosis Date  . Osteopenia   . History of breast cancer   . Cancer ([place]) 1997    Rt. breast ca  . Blood transfusion without reported diagnosis 1970's    multiple--following desmoid tumor surgery  . Heart murmur   . Ulcerative colitis ([place])   . Heart murmur   . 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 Tendonitis of ankle, left   . Lichen sclerosus     --vulva  . Incisional hernia     Past Surgical History  Procedure Laterality Date  . Nephrectomy  1979  . Abdominal hysterectomy  1979    TAH/BSO  . Breast surgery  1997    Rt. mastectomy/reconstruction  . Melanoma removal  2002    --rt. thigh  . Desmoid tumor      --left groin--s/p  five resections  . Sigmoid resection / rectopexy    . Tonsillectomy and adenoidectomy    . Bunionectomy Right     Current Outpatient Prescriptions  Medication Sig Dispense Refill  . clobetasol ointment (TEMOVATE) [id number] % APPLY 1 APPLICATION TOPICALLY ONCE WEEKLY. 30 g 1  . mesalamine (LIALDA) 1.2 G EC tablet Take 1.2 g by mouth daily with breakfast.    . Multiple Vitamin (MULTIVITAMIN) capsule Take 1 capsule by mouth daily.     No current facility-administered medications for this visit.    Family History  Problem Relation Age of Onset  . Ovarian cancer Sister 48    dec  . Cancer Sister   . Cancer Maternal Grandmother 90    colon  . Transient ischemic attack Maternal Grandmother   . Breast cancer Paternal Grandmother 13  . Deep vein thrombosis Mother   . Cancer Father   . Hypertension Father     ROS:  Pertinent items are noted in HPI.  Otherwise, a comprehensive ROS was negative.  Exam:   There were no vitals taken for this visit.    General appearance: alert, cooperative and appears stated age Head: Normocephalic, without obvious abnormality, atraumatic Neck: no adenopathy,  supple, symmetrical, trachea midline and thyroid normal to inspection and palpation Lungs: clear to auscultation bilaterally Breasts: normal appearance, no masses or tenderness, Inspection negative, No nipple retraction or dimpling, No nipple discharge or bleeding, No axillary or supraclavicular adenopathy, scar of left breast.  Right breast absent and implant in place. Heart: regular rate and rhythm Abdomen: vertical midline incision, soft, non-tender;  no masses,  no organomegaly Extremities: extremities normal, atraumatic, no cyanosis or edema.  Left support hose on. Skin: Skin color, texture, turgor normal. No rashes or lesions Lymph nodes: Cervical, supraclavicular, and axillary nodes normal. No abnormal inguinal nodes palpated Neurologic: Grossly normal  Pelvic: External genitalia:  Left  suprapubic hernia extending from the vertical midline incision - tender to palpation.  Slight hypopigmentation of the superior labia minora.  No ulceration or excoriation.                Urethra:  normal appearing urethra with no masses, tenderness or lesions              Bartholins and Skenes: normal                 Vagina: normal appearing vagina with normal color and discharge, no lesions              Cervix: absent              Pap taken: No. Bimanual Exam:  Uterus:  uterus absent and shortened vagina.  Pale mucosa.              Adnexa: no mass, fullness, tenderness              Rectovaginal: No..  Reclined. Chaperone was present for exam.  Assessment:   Well woman visit with normal exam. Status post TAH/BSO.  Hx sarcoma of left leg.  Status post right mastectomy for breast cancer.  Osteopenia.  Lichen sclerosus. Controlled with clobetasol. Incisional hernia.  Plan: Yearly mammogram recommended after age 5.  Recommended self breast exam.  Pap and HR HPV as above. Discussed Calcium, Vitamin D, regular exercise program including cardiovascular and weight bearing exercise. Labs performed.  No..   See orders. Refills given on medications.  Yes.  .  See orders.  Clobetasol ointment.  Reduce to once per week. Discussed abdominal binder prn.  BMD 2018. Follow up annually and prn.      After visit summary provided.

## 2015-09-19 NOTE — Patient Instructions (Signed)

## 2015-10-31 ENCOUNTER — Other Ambulatory Visit: Payer: Self-pay

## 2015-10-31 DIAGNOSIS — Z1231 Encounter for screening mammogram for malignant neoplasm of breast: Secondary | ICD-10-CM

## 2015-11-14 ENCOUNTER — Ambulatory Visit
Admission: RE | Admit: 2015-11-14 | Discharge: 2015-11-14 | Disposition: A | Discharge status: Home / Self Care | Payer: Medicare Other | Source: Ambulatory Visit

## 2015-11-14 DIAGNOSIS — Z1231 Encounter for screening mammogram for malignant neoplasm of breast: Secondary | ICD-10-CM

## 2015-11-21 DIAGNOSIS — K5289 Other specified noninfective gastroenteritis and colitis: Secondary | ICD-10-CM | POA: Diagnosis not present

## 2015-12-20 ENCOUNTER — Encounter: Payer: Self-pay | Admitting: Genetic Counselor

## 2016-01-24 DIAGNOSIS — H35371 Puckering of macula, right eye: Secondary | ICD-10-CM | POA: Diagnosis not present

## 2016-02-03 DIAGNOSIS — H0014 Chalazion left upper eyelid: Secondary | ICD-10-CM | POA: Diagnosis not present

## 2016-02-24 DIAGNOSIS — H0014 Chalazion left upper eyelid: Secondary | ICD-10-CM | POA: Diagnosis not present

## 2016-03-06 DIAGNOSIS — Z23 Encounter for immunization: Secondary | ICD-10-CM | POA: Diagnosis not present

## 2016-03-17 DIAGNOSIS — R197 Diarrhea, unspecified: Secondary | ICD-10-CM | POA: Diagnosis not present

## 2016-03-17 DIAGNOSIS — R110 Nausea: Secondary | ICD-10-CM | POA: Diagnosis not present

## 2016-06-09 DIAGNOSIS — K5289 Other specified noninfective gastroenteritis and colitis: Secondary | ICD-10-CM | POA: Diagnosis not present

## 2016-06-09 DIAGNOSIS — Z23 Encounter for immunization: Secondary | ICD-10-CM | POA: Diagnosis not present

## 2016-06-09 DIAGNOSIS — Z0000 Encounter for general adult medical examination without abnormal findings: Secondary | ICD-10-CM | POA: Diagnosis not present

## 2016-06-09 DIAGNOSIS — E7800 Pure hypercholesterolemia, unspecified: Secondary | ICD-10-CM | POA: Diagnosis not present

## 2016-06-29 DIAGNOSIS — M810 Age-related osteoporosis without current pathological fracture: Secondary | ICD-10-CM

## 2016-06-29 DIAGNOSIS — R7989 Other specified abnormal findings of blood chemistry: Secondary | ICD-10-CM

## 2016-06-29 HISTORY — PX: COLONOSCOPY, DIAGNOSTIC (SCREENING): SHX174

## 2016-06-29 HISTORY — PX: COLONOSCOPY: SHX174

## 2016-06-29 HISTORY — DX: Other specified abnormal findings of blood chemistry: R79.89

## 2016-06-29 HISTORY — DX: Age-related osteoporosis without current pathological fracture: M81.0

## 2016-09-02 DIAGNOSIS — Z85828 Personal history of other malignant neoplasm of skin: Secondary | ICD-10-CM | POA: Diagnosis not present

## 2016-09-02 DIAGNOSIS — L814 Other melanin hyperpigmentation: Secondary | ICD-10-CM | POA: Diagnosis not present

## 2016-09-02 DIAGNOSIS — Z86018 Personal history of other benign neoplasm: Secondary | ICD-10-CM | POA: Diagnosis not present

## 2016-09-02 DIAGNOSIS — Z85820 Personal history of malignant melanoma of skin: Secondary | ICD-10-CM | POA: Diagnosis not present

## 2016-09-02 DIAGNOSIS — B351 Tinea unguium: Secondary | ICD-10-CM | POA: Diagnosis not present

## 2016-09-02 DIAGNOSIS — H00012 Hordeolum externum right lower eyelid: Secondary | ICD-10-CM | POA: Diagnosis not present

## 2016-09-02 DIAGNOSIS — D225 Melanocytic nevi of trunk: Secondary | ICD-10-CM | POA: Diagnosis not present

## 2016-09-02 DIAGNOSIS — Z411 Encounter for cosmetic surgery: Secondary | ICD-10-CM | POA: Diagnosis not present

## 2016-09-02 DIAGNOSIS — L570 Actinic keratosis: Secondary | ICD-10-CM | POA: Diagnosis not present

## 2016-09-02 DIAGNOSIS — D1800 Hemangioma unspecified site: Secondary | ICD-10-CM | POA: Diagnosis not present

## 2016-09-02 DIAGNOSIS — L820 Inflamed seborrheic keratosis: Secondary | ICD-10-CM | POA: Diagnosis not present

## 2016-09-24 NOTE — Progress Notes (Signed)
71 y.o. G82P2002 Single Caucasian female here for annual exam.    Grandchildren visited for Easter.    Using clobetasol once a week on the vulvar area for lichen sclerosus.  No symptoms of itching or burning.  No refill needed.  Feels like her lower abdominal hernia is getting bigger.  No significant pain.  Has been told in the past to observe unless it becomes painful or really symptomatic.  No longer has a Psychologist, sport and exercise following her.  Wears a compression hose on her left leg all the time.   Had labs in December 2017.   PCP:   Dr. Milagros Evener [facility]  No LMP recorded. Patient has had a hysterectomy.           Sexually active: No.  The current method of family planning is status post hysterectomy.    Exercising: No.  The patient does not participate in regular exercise at present. Smoker:  no  Health Maintenance: Pap:  07-17-12 wnl:neg HR HPV History of abnormal Pap:  no MMG:  11-14-15 Bi-Rads 1 negative/density C Colonoscopy:  About 5 years ago with Dr. Tanna Furry per patient  BMD:   BMD 08/27/14   Result  Osteopenia of hip and forearm TDaP:  2009 with PCP Screening Labs:  Hb today: PCP, Urine today: PCP   reports that she has never smoked. She has never used smokeless tobacco. She reports that she does not drink alcohol or use drugs.  Past Medical History:  Diagnosis Date  . Blood transfusion without reported diagnosis 1970's   multiple--following desmoid tumor surgery  . Cancer ([place]) 1997   Rt. breast ca  . Heart murmur   . Heart murmur   . History of breast cancer   . Incisional hernia   . Lichen sclerosus    --vulva  . Osteopenia   . 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 Tendonitis of ankle, left   . Ulcerative colitis [facility])     Past Surgical History:  Procedure Laterality Date  . ABDOMINAL HYSTERECTOMY  1979   TAH/BSO  . BREAST SURGERY  1997   Rt. mastectomy/reconstruction  . 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 melanoma removal  2002   --rt. thigh  . NEPHRECTOMY  1979  . SIGMOID RESECTION / RECTOPEXY    . TONSILLECTOMY AND ADENOIDECTOMY      Current Outpatient Prescriptions  Medication Sig Dispense Refill  . Acetaminophen (TYLENOL) 325 MG CAPS Take by mouth as needed.    . clobetasol ointment (TEMOVATE) 9.37 % APPLY 1 APPLICATION TOPICALLY ONCE WEEKLY. 30 g 1  . mesalamine (LIALDA) 1.2 G EC tablet Take 1.2 g by mouth daily with breakfast.    . Multiple Vitamin (MULTIVITAMIN) capsule Take 1 capsule by mouth daily.     No current facility-administered medications for this visit.     Family History  Problem Relation Age of Onset  . Ovarian cancer Sister 63    dec  . Cancer Sister   . Deep vein thrombosis Mother   . Cancer Father   . Hypertension Father   . Cancer Maternal Grandmother 90    colon  . Transient ischemic attack Maternal Grandmother   . Breast cancer Paternal Grandmother 37    ROS:  Pertinent items are noted in HPI.  Otherwise, a comprehensive ROS was negative.  Exam:   BP 128/76 (BP Location: Right Arm, Patient Position: Sitting, Cuff Size:  Normal)   Pulse 76   Resp 16   Ht 5' 5.25" (1.657 m)   Wt 155 lb (70.3 kg)   BMI 25.60 kg/m     General appearance: alert, cooperative and appears stated age Head: Normocephalic, without obvious abnormality, atraumatic Neck: no adenopathy, supple, symmetrical, trachea midline and thyroid normal to inspection and palpation Lungs: clear to auscultation bilaterally Breasts: right breat absent and reconstruction present, left breast with scar at 2:00, no masses or tenderness, No nipple retraction or dimpling, No nipple discharge or bleeding, No axillary or supraclavicular adenopathy Heart: regular rate and rhythm Abdomen: soft, non-tender; no masses, no organomegaly Extremities: extremities normal, atraumatic, no cyanosis or edema.  Left LE compression hose on. Skin: Skin color, texture, turgor  normal. No rashes or lesions Lymph nodes: Cervical, supraclavicular, and axillary nodes normal. No abnormal inguinal nodes palpated Neurologic: Grossly normal  Pelvic: External genitalia:  no lesions.  Left suprapubic hernia extending left toward mildline, slightly tender.  Hypopigmentation of the labia bilaterally.  Some loss of architecture.               Urethra:  normal appearing urethra with no masses, tenderness or lesions              Bartholins and Skenes: normal                 Vagina: normal appearing vagina with normal color and discharge, no lesions.  Shortened vagina.               Cervix:  Absent.               Pap taken: No. Bimanual Exam:  Uterus:   Absent.               Adnexa: no mass, fullness, tenderness              Rectal exam: No..  Confirms.              Anus:  normal sphincter tone, no lesions  Chaperone was present for exam.  Assessment:   Well woman visit with normal exam. Status post TAH/BSO.  Hx sarcoma of left leg.  Status post right mastectomy for breast cancer.  Osteopenia.  Lichen sclerosus. Controlled with clobetasol. Incisional hernia. Hx nephrectomy.   Plan: Mammogram screening discussed. Recommended self breast awareness. Pap and HR HPV as above. Guidelines for Calcium, Vitamin D, regular exercise program including cardiovascular and weight bearing exercise.  Ok for clobetasol once weekly.  She will contact the office if she needs refills. Will do BMD at time of mammogram this year.  She wants to observe the hernia for now.  If she needs surgical care, she may need to return to [facility]. Follow up annually and prn.    After visit summary provided.

## 2016-09-28 ENCOUNTER — Encounter: Payer: Self-pay | Admitting: Obstetrics and Gynecology

## 2016-09-28 ENCOUNTER — Ambulatory Visit (INDEPENDENT_AMBULATORY_CARE_PROVIDER_SITE_OTHER): Payer: Medicare Other | Admitting: Obstetrics and Gynecology

## 2016-09-28 VITALS — BP 128/76 | HR 76 | Resp 16 | Ht 65.25 in | Wt 155.0 lb

## 2016-09-28 DIAGNOSIS — Z124 Encounter for screening for malignant neoplasm of cervix: Secondary | ICD-10-CM | POA: Diagnosis not present

## 2016-09-28 DIAGNOSIS — Z780 Asymptomatic menopausal state: Secondary | ICD-10-CM

## 2016-09-28 DIAGNOSIS — Z01419 Encounter for gynecological examination (general) (routine) without abnormal findings: Secondary | ICD-10-CM

## 2016-09-28 NOTE — Patient Instructions (Signed)

## 2016-10-06 ENCOUNTER — Other Ambulatory Visit: Payer: Self-pay | Admitting: Obstetrics and Gynecology

## 2016-10-06 DIAGNOSIS — Z1231 Encounter for screening mammogram for malignant neoplasm of breast: Secondary | ICD-10-CM

## 2016-10-21 DIAGNOSIS — K5289 Other specified noninfective gastroenteritis and colitis: Secondary | ICD-10-CM | POA: Diagnosis not present

## 2016-11-11 DIAGNOSIS — R350 Frequency of micturition: Secondary | ICD-10-CM | POA: Diagnosis not present

## 2016-11-16 ENCOUNTER — Other Ambulatory Visit: Payer: Self-pay | Admitting: Obstetrics and Gynecology

## 2016-11-16 DIAGNOSIS — Z780 Asymptomatic menopausal state: Secondary | ICD-10-CM

## 2016-11-16 DIAGNOSIS — M8580 Other specified disorders of bone density and structure, unspecified site: Secondary | ICD-10-CM

## 2016-11-18 ENCOUNTER — Other Ambulatory Visit: Payer: Medicare Other

## 2016-11-18 ENCOUNTER — Ambulatory Visit
Admission: RE | Admit: 2016-11-18 | Discharge: 2016-11-18 | Disposition: A | Discharge status: Home / Self Care | Payer: Medicare Other | Source: Ambulatory Visit | Attending: Obstetrics and Gynecology | Admitting: Obstetrics and Gynecology

## 2016-11-18 DIAGNOSIS — M85052 Fibrous dysplasia (monostotic), left thigh: Secondary | ICD-10-CM | POA: Diagnosis not present

## 2016-11-18 DIAGNOSIS — M80032A Age-related osteoporosis with current pathological fracture, left forearm, initial encounter for fracture: Secondary | ICD-10-CM | POA: Diagnosis not present

## 2016-11-18 DIAGNOSIS — Z1231 Encounter for screening mammogram for malignant neoplasm of breast: Secondary | ICD-10-CM

## 2016-11-18 DIAGNOSIS — M8580 Other specified disorders of bone density and structure, unspecified site: Secondary | ICD-10-CM

## 2016-11-18 DIAGNOSIS — Z780 Asymptomatic menopausal state: Secondary | ICD-10-CM

## 2016-11-18 DIAGNOSIS — M810 Age-related osteoporosis without current pathological fracture: Secondary | ICD-10-CM | POA: Diagnosis not present

## 2016-11-24 ENCOUNTER — Telehealth: Payer: Self-pay

## 2016-11-24 NOTE — Telephone Encounter (Signed)
-----   Message from Nunzio Cobbs, MD sent at 11/22/2016 12:49 PM EDT ----- Please report results of BMD which are showing osteoporosis of the forearm and osteopenia of the hip.  The spine was not measured due to degenerative changes noted.  Please have her make an appointment with me to discuss the results further.  She qualifies for treatment of the osteoporosis.

## 2016-11-24 NOTE — Telephone Encounter (Signed)
Spoke with patient. Advised of results and message as seen below from [place]. Patient is agreeable and verbalizes understanding. Appointment scheduled for 12/02/2016 at 8:15 am with Dr.Silva. Patient is agreeable to date and time.  Routing to provider for final review. Patient agreeable to disposition. Will close encounter.

## 2016-12-02 ENCOUNTER — Ambulatory Visit (INDEPENDENT_AMBULATORY_CARE_PROVIDER_SITE_OTHER): Payer: Medicare Other | Admitting: Obstetrics and Gynecology

## 2016-12-02 ENCOUNTER — Encounter: Payer: Self-pay | Admitting: Obstetrics and Gynecology

## 2016-12-02 VITALS — BP 118/70 | HR 72 | Resp 16 | Wt 155.0 lb

## 2016-12-02 DIAGNOSIS — R7989 Other specified abnormal findings of blood chemistry: Secondary | ICD-10-CM

## 2016-12-02 DIAGNOSIS — E559 Vitamin D deficiency, unspecified: Secondary | ICD-10-CM | POA: Diagnosis not present

## 2016-12-02 DIAGNOSIS — M810 Age-related osteoporosis without current pathological fracture: Secondary | ICD-10-CM

## 2016-12-02 NOTE — Patient Instructions (Signed)

## 2016-12-02 NOTE — Progress Notes (Signed)
GYNECOLOGY  VISIT   HPI: 71 y.o.   Single  Caucasian  female   G2P2002 with No LMP recorded. Patient has had a hysterectomy.   here for BMD consult -- results in EPIC    BMD shows T score -2.7 forearm.  T score -2.2 of right hip.   GYNECOLOGIC HISTORY: No LMP recorded. Patient has had a hysterectomy. Contraception:  Post hysterectomy Menopausal hormone therapy:  none Last mammogram:  11/18/16 BIRADS 1 negative/density d Last pap smear:   07-17-12 wnl:neg HR HPV        OB History    Gravida Para Term Preterm AB Living   2 2 2     2    SAB TAB Ectopic Multiple Live Births                     Patient Active Problem List   Diagnosis Date Noted  . Chronic venous insufficiency 01/05/2014    Past Medical History:  Diagnosis Date  . Blood transfusion without reported diagnosis 1970's   multiple--following desmoid tumor surgery  . Cancer ([place]) 1997   Rt. breast ca  . Heart murmur   . Heart murmur   . History of breast cancer   . Incisional hernia   . Lichen sclerosus    --vulva  . Osteopenia   . 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 Tendonitis of ankle, left   . Ulcerative colitis [facility])     Past Surgical History:  Procedure Laterality Date  . ABDOMINAL HYSTERECTOMY  1979   TAH/BSO  . BREAST SURGERY  1997   Rt. mastectomy/reconstruction  . 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 MASTECTOMY Right 1997  . melanoma removal  2002   --rt. thigh  . NEPHRECTOMY  1979  . SIGMOID RESECTION / RECTOPEXY    . TONSILLECTOMY AND ADENOIDECTOMY      Current Outpatient Prescriptions  Medication Sig Dispense Refill  . Acetaminophen (TYLENOL) 325 MG CAPS Take by mouth as needed.    . clobetasol ointment (TEMOVATE) 6.60 % APPLY 1 APPLICATION TOPICALLY ONCE WEEKLY. 30 g 1  . mesalamine (LIALDA) 1.2 G EC tablet Take 1.2 g by mouth daily with breakfast.    . Multiple Vitamin (MULTIVITAMIN) capsule Take 1 capsule by mouth daily.     No current  facility-administered medications for this visit.      ALLERGIES: Codeine  Family History  Problem Relation Age of Onset  . Ovarian cancer Sister 67       dec  . Cancer Sister   . Deep vein thrombosis Mother   . Cancer Father   . Hypertension Father   . Cancer Maternal Grandmother 90       colon  . Transient ischemic attack Maternal Grandmother   . Breast cancer Maternal Grandmother   . Breast cancer Paternal Grandmother 97    Social History   Social History  . Marital status: Single    Spouse name: N/A  . Number of children: N/A  . Years of education: N/A   Occupational History  . Not on file.   Social History Main Topics  . Smoking status: Never Smoker  . Smokeless tobacco: Never Used  . Alcohol use No  . Drug use: No  . Sexual activity: No     Comment: TAH/BSO   Other Topics Concern  . Not on file   Social History Narrative  . No narrative on  file    ROS:  Pertinent items are noted in HPI.  PHYSICAL EXAMINATION:    BP 118/70 (BP Location: Right Arm, Patient Position: Sitting, Cuff Size: Normal)   Pulse 72   Resp 16   Wt 155 lb (70.3 kg)   BMI 25.60 kg/m     General appearance: alert, cooperative and appears stated age   ASSESSMENT  Status post TAH/BSO. Hx desmoid tumor.  Status post nephrectomy.  Hx ulcerative colitis. Chronic venous insufficiency.  Hx breast cancer.  FH of breast and ovarian cancer.   PLAN  Discussed osteoporosis, risk factors, increased risk of fracture, tx options.  Discussed how to reduce risk of falls.  We reviewed oral meds - bisphosphonates and Evista, which are not good choices for the patient.  We did review Prolia, which is a good choice for her.   Written information to patient.  I do not think that Forteo is necessary at this time.  Check CMP, vit D, and PTH.  BMD in 2 years.   An After Visit Summary was printed and given to the patient.  ___15___ minutes face to face time of which over 50% was spent in  counseling.

## 2016-12-03 ENCOUNTER — Encounter: Payer: Self-pay | Admitting: Obstetrics and Gynecology

## 2016-12-03 ENCOUNTER — Telehealth: Payer: Self-pay

## 2016-12-03 DIAGNOSIS — E559 Vitamin D deficiency, unspecified: Secondary | ICD-10-CM

## 2016-12-03 LAB — COMPREHENSIVE METABOLIC PANEL
ALBUMIN: 4 g/dL (ref 3.6–4.8)
ALT: 19 IU/L (ref 0–32)
AST: 22 IU/L (ref 0–40)
Albumin/Globulin Ratio: 1.5 (ref 1.2–2.2)
Alkaline Phosphatase: 72 IU/L (ref 39–117)
BILIRUBIN TOTAL: 0.3 mg/dL (ref 0.0–1.2)
BUN / CREAT RATIO: 17 (ref 12–28)
BUN: 14 mg/dL (ref 8–27)
CALCIUM: 9.1 mg/dL (ref 8.7–10.3)
CHLORIDE: 103 mmol/L (ref 96–106)
CO2: 24 mmol/L (ref 18–29)
Creatinine, Ser: 0.84 mg/dL (ref 0.57–1.00)
GFR, EST AFRICAN AMERICAN: 83 mL/min/{1.73_m2} (ref >59)
GFR, EST NON AFRICAN AMERICAN: 72 mL/min/{1.73_m2} (ref >59)
Globulin, Total: 2.6 g/dL (ref 1.5–4.5)
Glucose: 94 mg/dL (ref 65–99)
Potassium: 4.5 mmol/L (ref 3.5–5.2)
Sodium: 141 mmol/L (ref 134–144)
Total Protein: 6.6 g/dL (ref 6.0–8.5)

## 2016-12-03 LAB — VITAMIN D 25 HYDROXY (VIT D DEFICIENCY, FRACTURES): VIT D 25 HYDROXY: 25.1 ng/mL — AB (ref 30.0–100.0)

## 2016-12-03 LAB — PARATHYROID HORMONE, INTACT (NO CA): PTH: 37 pg/mL (ref 15–65)

## 2016-12-03 NOTE — Addendum Note (Signed)
Addended by: Aundria Rud on: 12/03/2016 06:28 AM   Modules accepted: Orders

## 2016-12-03 NOTE — Telephone Encounter (Addendum)
Spoke with patient. Advised of results as seen below from [place]. Patient is agreeable and verbalizes understanding. 6 week recheck scheduled for 7/25 at 8:30 am. Patient is agreeable to date and time.  ----- Message from Nunzio Cobbs, MD sent at 12/03/2016  6:29 AM EDT ----- Patient will need to sign a new ABN form when she has this follow up blood work done.  Please make a note in her appointment scheduling about this.  Notes recorded by Nunzio Cobbs, MD on 12/03/2016 at 6:28 AM EDT Please inform of low Vit D level.  I am recommending vit D3 2000 IU daily due to her colitis issues.  We will need to recheck this again in about 6 weeks.  Please make a lab appointment for this. Her calcium, kidney function, and parathyroid hormone levels are all normal.  Routing to provider for final review. Patient agreeable to disposition. Will close encounter.

## 2017-01-20 ENCOUNTER — Other Ambulatory Visit (INDEPENDENT_AMBULATORY_CARE_PROVIDER_SITE_OTHER): Payer: Medicare Other

## 2017-01-20 DIAGNOSIS — E559 Vitamin D deficiency, unspecified: Secondary | ICD-10-CM | POA: Diagnosis not present

## 2017-01-21 LAB — VITAMIN D 25 HYDROXY (VIT D DEFICIENCY, FRACTURES): Vit D, 25-Hydroxy: 27.1 ng/mL — ABNORMAL LOW (ref 30.0–100.0)

## 2017-01-25 ENCOUNTER — Telehealth: Payer: Self-pay | Admitting: *Deleted

## 2017-01-25 MED ORDER — VITAMIN D (ERGOCALCIFEROL) 1.25 MG (50000 UNIT) PO CAPS
50000.0000 [IU] | ORAL_CAPSULE | ORAL | 0 refills | Status: DC
Start: 2017-01-25 — End: 2017-10-01

## 2017-01-25 NOTE — Telephone Encounter (Signed)
Spoke with patient, advised as seen below per Dr. Quincy Simmonds. Patient scheduled for lab appointment on 03/24/17 at 9:55am. Rx for Vitamin D to verified pharmacy on file. Patient asking if she is to discontinue multivitamin and previous OTC Vitamin D? Advised patient can take multivitamin, stop any additional Vit D supplement. Patient verbalizes understanding and is agreeable.  Routing to provider for final review. Patient is agreeable to disposition. Will close encounter.   No future lab order placed, will need to be placed day of lab to print and sign medicare waiver.

## 2017-01-25 NOTE — Telephone Encounter (Signed)
Notes recorded by Burnice Logan, RN on 01/25/2017 at 1:48 PM EDT Left message to call Sharee Pimple at [phone].  ------  Notes recorded by Nunzio Cobbs, MD on 01/21/2017 at 5:11 PM EDT Please contact patient regarding her vit D level.  It did not improve much.  I am recommending vit D 50,000 IU weekly for 8 weeks and then rechecking a level. Please send to pharmacy of choice. I am unable to place a future order because the patient will need to sign a Medicare waiver form.  Please schedule the lab recheck.

## 2017-01-29 DIAGNOSIS — H35372 Puckering of macula, left eye: Secondary | ICD-10-CM | POA: Diagnosis not present

## 2017-03-24 ENCOUNTER — Other Ambulatory Visit (INDEPENDENT_AMBULATORY_CARE_PROVIDER_SITE_OTHER): Payer: Medicare Other

## 2017-03-24 DIAGNOSIS — E559 Vitamin D deficiency, unspecified: Secondary | ICD-10-CM

## 2017-03-25 ENCOUNTER — Telehealth: Payer: Self-pay

## 2017-03-25 ENCOUNTER — Encounter: Payer: Self-pay | Admitting: Obstetrics and Gynecology

## 2017-03-25 LAB — VITAMIN D 25 HYDROXY (VIT D DEFICIENCY, FRACTURES): VIT D 25 HYDROXY: 36.5 ng/mL (ref 30.0–100.0)

## 2017-03-25 NOTE — Telephone Encounter (Signed)
I will forward this to triage to start precert for Prolia for osteoporosis.   Cc - Triage

## 2017-03-25 NOTE — Telephone Encounter (Signed)
-----   Message from Nunzio Cobbs, MD sent at 03/25/2017  1:28 PM EDT ----- Please report vit D level to patient.  Vit D level is now normal.  Our goal is to have her between 24 - 50.  Continue vit D 2000 IU daily.  This can be checked yearly at her office visit.

## 2017-03-25 NOTE — Telephone Encounter (Signed)
Spoke with patient and notified of normal vitamin d level and to continue with 2000 IUD of vitamin d daily. Patient wants to know if she is okay now to begin Prolia? Advised will discuss with Dr.Silva and call her back. Routed to Dr.Silva

## 2017-03-26 MED ORDER — DENOSUMAB 60 MG/ML ~~LOC~~ SOLN: 60.0000 mg | SUBCUTANEOUS | 1 refills | Status: DC

## 2017-03-26 NOTE — Telephone Encounter (Signed)
I signed the order for the Prolia.

## 2017-03-26 NOTE — Telephone Encounter (Signed)
Called patient and per DPR, left detailed message stating Prolia is being precerted and ordered and then she will be contacted to make an appointment to have injection administered. Advised to call back if any questions.

## 2017-03-26 NOTE — Telephone Encounter (Signed)
Call placed to [employer] to confirm specialty pharmacy, spoke with [facility].  [place] is Agricultural consultant.   Order placed for Prolia 60mg /ml inject 1 ml into the skin every 6 months. Administer in upper arm, thigh or abdomen. #1/1RF.   Routing to insurance/benefits for precert.  Cc: Dr. Quincy Simmonds

## 2017-04-01 ENCOUNTER — Telehealth: Payer: Self-pay | Admitting: Obstetrics and Gynecology

## 2017-04-01 NOTE — Telephone Encounter (Signed)
Left message to call Gailya Tauer at [phone].  

## 2017-04-01 NOTE — Telephone Encounter (Signed)
Spoke with patient. Patient calling to clarify instructions for prolia process prior to making her decision, wants to be sure of benefit coverage.  Advised patient once prolia preauthorized, specialty pharmacy will contact patient to discuss medication coverage and cost. Shipment will then be coordinated between [facility] and specialty pharmacy, nurse visit then scheduled for injection.   Patient has additional questions about bone health/fractures, short term and long term side effects of prolia. Recommended OV with Dr. Quincy Simmonds prior to prolia to further discuss. Patient states she will wait for benefits first and return call to schedule OV.   Patient thankful for clarification and verbalizes understanding. Advised patient will review with Dr. Quincy Simmonds and return call with any additional recommendations.   Routing to provider for final review. Patient is agreeable to disposition. Will close encounter.   CC: Magdalene Patricia

## 2017-04-01 NOTE — Telephone Encounter (Signed)
Patient returned call and will be available after 2 PM today.

## 2017-04-01 NOTE — Telephone Encounter (Signed)
Left message to call Azara Gemme at [phone].  

## 2017-04-01 NOTE — Telephone Encounter (Signed)
Patient is returning a call to Jill. °

## 2017-04-06 ENCOUNTER — Telehealth: Payer: Self-pay | Admitting: *Deleted

## 2017-04-06 NOTE — Telephone Encounter (Signed)
Spoke with patient. Calling to notify that she has spoken with [facility] and would like to proceed with Prolia. Advised patient RN would contact specialty pharmacy - [facility]. Advised patient [facility] will then contact her prior to scheduling delivery of Prolia. Once delivery date scheduled will schedule nurse visit for prolia injection. Patient verbalizes understanding and is agreeable.

## 2017-04-06 NOTE — Telephone Encounter (Signed)
Spoke with Chellandria at [place] in regards to [place]. [facility] needs to speak with patient to obtain consent for delivery. May have patient return call to [phone], option 1.

## 2017-04-07 NOTE — Telephone Encounter (Addendum)
Spoke with patient. Patient aware [facility] will call her to receive consent for delivery of Prolia. Once [facility] is notified by pharmacy and delivery date scheduled, will call to schedule injection. RN provided patient with specialty pharmacy info and contact. Patient verbalizes understanding and is agreeable.    Dr. Quincy Simmonds - any additional labs prior to scheduling prolia?

## 2017-04-07 NOTE — Telephone Encounter (Signed)
Will notify patient once delivery is scheduled for prolia.  Will close encounter.

## 2017-04-07 NOTE — Telephone Encounter (Signed)
No labs needed.  Everything is normal and up to date with respect to bone health monitoring.

## 2017-04-12 NOTE — Telephone Encounter (Signed)
PA for Prolia received from [facility], approved for 03/01/17-03/31/2018.   Letter to scan.

## 2017-04-20 ENCOUNTER — Telehealth: Payer: Self-pay | Admitting: Obstetrics and Gynecology

## 2017-04-20 NOTE — Telephone Encounter (Signed)
Patient called to let the nurse know she has requested her next Prolia injection from the specialty pharmacy. They asked her to call our office and let us know.

## 2017-04-20 NOTE — Telephone Encounter (Signed)
Call to specialty pharamcy [place], spoke with [place] to schedule delivery of Prolia to [facility], verified address on file. Delivery scheduled for 04/22/17. Patient new packet information will be included, please provide to patient.

## 2017-04-20 NOTE — Telephone Encounter (Signed)
Spoke with patient, advised delivery of Prolia to office scheduled for 10/25. Scheduled for Nurse visit on 10/29 at 10:30am for prolia injection. Patient verbalizes understanding and is agreeable.   Routing to provider for final review. Patient is agreeable to disposition. Will close encounter.

## 2017-04-26 ENCOUNTER — Ambulatory Visit (INDEPENDENT_AMBULATORY_CARE_PROVIDER_SITE_OTHER): Payer: Medicare Other

## 2017-04-26 VITALS — BP 116/60 | HR 84 | Resp 16 | Wt 160.0 lb

## 2017-04-26 DIAGNOSIS — M810 Age-related osteoporosis without current pathological fracture: Secondary | ICD-10-CM

## 2017-04-26 MED ORDER — DENOSUMAB 60 MG/ML ~~LOC~~ SOLN
60.0000 mg | Freq: Once | SUBCUTANEOUS | Status: AC
  Administered 2017-04-26: 60 mg via SUBCUTANEOUS

## 2017-04-26 NOTE — Progress Notes (Signed)
Patient in office for initial Prolia injection.  Injection administered in the Left Arm and was tolerated well. Patient has next AEX scheduled 10/01/17.  Routing to provider for final review. Patient agreeable to disposition. Will close encounter.

## 2017-05-30 DIAGNOSIS — R05 Cough: Secondary | ICD-10-CM | POA: Diagnosis not present

## 2017-06-29 HISTORY — PX: SQUAMOUS CELL CARCINOMA EXCISION: SHX2433

## 2017-06-30 DIAGNOSIS — E7800 Pure hypercholesterolemia, unspecified: Secondary | ICD-10-CM | POA: Diagnosis not present

## 2017-06-30 DIAGNOSIS — D239 Other benign neoplasm of skin, unspecified: Secondary | ICD-10-CM | POA: Diagnosis not present

## 2017-06-30 DIAGNOSIS — Z0000 Encounter for general adult medical examination without abnormal findings: Secondary | ICD-10-CM | POA: Diagnosis not present

## 2017-07-08 DIAGNOSIS — L821 Other seborrheic keratosis: Secondary | ICD-10-CM | POA: Diagnosis not present

## 2017-07-08 DIAGNOSIS — Z23 Encounter for immunization: Secondary | ICD-10-CM | POA: Diagnosis not present

## 2017-09-06 DIAGNOSIS — Z85820 Personal history of malignant melanoma of skin: Secondary | ICD-10-CM | POA: Diagnosis not present

## 2017-09-06 DIAGNOSIS — D485 Neoplasm of uncertain behavior of skin: Secondary | ICD-10-CM | POA: Diagnosis not present

## 2017-09-06 DIAGNOSIS — L814 Other melanin hyperpigmentation: Secondary | ICD-10-CM | POA: Diagnosis not present

## 2017-09-06 DIAGNOSIS — Z23 Encounter for immunization: Secondary | ICD-10-CM | POA: Diagnosis not present

## 2017-09-06 DIAGNOSIS — L570 Actinic keratosis: Secondary | ICD-10-CM | POA: Diagnosis not present

## 2017-09-06 DIAGNOSIS — D225 Melanocytic nevi of trunk: Secondary | ICD-10-CM | POA: Diagnosis not present

## 2017-09-06 DIAGNOSIS — D1800 Hemangioma unspecified site: Secondary | ICD-10-CM | POA: Diagnosis not present

## 2017-09-06 DIAGNOSIS — L820 Inflamed seborrheic keratosis: Secondary | ICD-10-CM | POA: Diagnosis not present

## 2017-09-06 DIAGNOSIS — L821 Other seborrheic keratosis: Secondary | ICD-10-CM | POA: Diagnosis not present

## 2017-09-06 DIAGNOSIS — Z85828 Personal history of other malignant neoplasm of skin: Secondary | ICD-10-CM | POA: Diagnosis not present

## 2017-09-06 DIAGNOSIS — Z86018 Personal history of other benign neoplasm: Secondary | ICD-10-CM | POA: Diagnosis not present

## 2017-10-01 ENCOUNTER — Other Ambulatory Visit: Payer: Self-pay

## 2017-10-01 ENCOUNTER — Ambulatory Visit (INDEPENDENT_AMBULATORY_CARE_PROVIDER_SITE_OTHER): Payer: Medicare Other | Admitting: Obstetrics and Gynecology

## 2017-10-01 ENCOUNTER — Encounter: Payer: Self-pay | Admitting: Obstetrics and Gynecology

## 2017-10-01 VITALS — BP 116/70 | HR 84 | Resp 16 | Ht 65.5 in | Wt 160.0 lb

## 2017-10-01 DIAGNOSIS — Z01419 Encounter for gynecological examination (general) (routine) without abnormal findings: Secondary | ICD-10-CM | POA: Diagnosis not present

## 2017-10-01 DIAGNOSIS — Z124 Encounter for screening for malignant neoplasm of cervix: Secondary | ICD-10-CM | POA: Diagnosis not present

## 2017-10-01 MED ORDER — CLOBETASOL PROPIONATE 0.05 % EX OINT: TOPICAL_OINTMENT | CUTANEOUS | 0 refills | Status: DC

## 2017-10-01 NOTE — Progress Notes (Signed)
72 y.o. G71P2002 Single Caucasian female here for annual exam.    Needs refill of Clobetasol.   Thinks she is due for Prolia injection this month.  Developed a blister in the skin of her right anterior chest wall within 2 weeks following the last injection.  Taking vit D 1000 IU daily. Some Ca in MVI and doing the rest through dietary sources.  Does blood work yearly with PCP.  She states normal vit D level with PCP.   PCP:   Dr. Milagros Evener  Patient's last menstrual period was 04/29/1978 (within years).           Sexually active: No.  The current method of family planning is status post hysterectomy.    Exercising: No.  The patient does not participate in regular exercise at present. Smoker:  no  Health Maintenance: Pap:  07-17-12 wnl:neg HR HPV History of abnormal Pap:  no MMG:  11/18/16 BIRADS 1 negative/density d Colonoscopy:  About 5 years ago per patient BMD:   11/18/16  Result  Osteoporosis.  On Prolia. TDaP:  2019 done at the Pharmacy Gardasil:   n/a HIV: unsure Hep C: unsure Screening Labs:  PCP   reports that she has never smoked. She has never used smokeless tobacco. She reports that she does not drink alcohol or use drugs.  Past Medical History:  Diagnosis Date  . Blood transfusion without reported diagnosis 1970's   multiple--following desmoid tumor surgery  . Cancer ([place]) 1997   Rt. breast ca  . Heart murmur   . Heart murmur   . History of breast cancer   . Incisional hernia   . Lichen sclerosus    --vulva  . Low vitamin D level 2018  . Osteoporosis 2018  . 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 Tendonitis of ankle, left   . Ulcerative colitis [facility])     Past Surgical History:  Procedure Laterality Date  . ABDOMINAL HYSTERECTOMY  1979   TAH/BSO  . BREAST SURGERY  1997   Rt. mastectomy/reconstruction  . 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 MASTECTOMY Right 1997  . 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 NEPHRECTOMY  1979  . SIGMOID RESECTION / RECTOPEXY    . TONSILLECTOMY AND ADENOIDECTOMY      Current Outpatient Medications  Medication Sig Dispense Refill  . Acetaminophen (TYLENOL) 325 MG CAPS Take by mouth as needed.    . Cholecalciferol (VITAMIN D3) 1000 units CAPS Take 1 capsule by mouth daily.    . clobetasol ointment (TEMOVATE) 8.67 % APPLY 1 APPLICATION TOPICALLY ONCE WEEKLY. 30 g 1  . denosumab (PROLIA) 60 MG/ML SOLN injection Inject 60 mg into the skin every 6 (six) months. Administer in upper arm, thigh, or abdomen 1 mL 1  . MAGNESIUM CITRATE PO Take by mouth daily.    Marland Kitchen MELATONIN PO Take by mouth as needed.     . mesalamine (LIALDA) 1.2 G EC tablet Take 1.2 g by mouth daily with breakfast.    . Multiple Vitamin (MULTIVITAMIN) capsule Take 1 capsule by mouth daily.     No current facility-administered medications for this visit.     Family History  Problem Relation Age of Onset  . Ovarian cancer Sister 10       dec  . Cancer Sister   . Deep vein thrombosis Mother   . Cancer Father   . Hypertension Father   .  Cancer Maternal Grandmother 90       colon  . Transient ischemic attack Maternal Grandmother   . Breast cancer Maternal Grandmother   . Breast cancer Paternal Grandmother 70    ROS:  Pertinent items are noted in HPI.  Otherwise, a comprehensive ROS was negative.  Exam:   BP 116/70 (BP Location: Right Arm, Patient Position: Sitting, Cuff Size: Normal)   Pulse 84   Resp 16   Ht 5' 5.5" (1.664 m)   Wt 160 lb (72.6 kg)   LMP 04/29/1978 (Within Years)   BMI 26.22 kg/m     General appearance: alert, cooperative and appears stated age Head: Normocephalic, without obvious abnormality, atraumatic Neck: no adenopathy, supple, symmetrical, trachea midline and thyroid normal to inspection and palpation Lungs: clear to auscultation bilaterally Breasts: right breast absent and implant noted, no axillary adenopathy.  Left breast - scar noted, no masses or  tenderness, No nipple retraction or dimpling, No nipple discharge or bleeding, No axillary or supraclavicular adenopathy Heart: regular rate and rhythm Abdomen:  Abdominal wall hernia and left groin hernia - tender to touch.  (Longstanding.) Skin: Skin color, texture, turgor normal. No rashes or lesions Lymph nodes: Cervical, supraclavicular, and axillary nodes normal. No abnormal inguinal nodes palpated Neurologic: Grossly normal  Pelvic: External genitalia:   Agglutination of the labia minora over the clitoris.               Urethra:  normal appearing urethra with no masses, tenderness or lesions              Bartholins and Skenes: normal                 Vagina: normal appearing vagina with normal color and discharge, no lesions              Cervix: absent              Pap taken: No. Bimanual Exam:  Uterus:  absent              Adnexa: no mass, fullness, tenderness              Rectal exam: Yes.  .  Confirms.              Anus:  normal sphincter tone, no lesions  Chaperone was present for exam.  Assessment:   Well woman visit with gynecologic exam. Status post TAH/BSO.  Hx sarcoma of left leg.  Status post right mastectomy for breast cancer.  Fh breast, ovarian, colon cancer. Osteoporosis.  On Prolia.  Lichen sclerosus. Controlled with clobetasol. Incisional hernia. Hx nephrectomy.   Plan: Mammogram screening discussed. Recommended self breast awareness. Pap and HR HPV not indicated. Guidelines for Calcium, Vitamin D, regular exercise program including cardiovascular and weight bearing exercise. Will check Prolia status for her next injection.  BMD in 2 years. Refill of Clobetasol ointment. Instructed her in use once weekly. Follow up annually and prn.   After visit summary provided.   Will reach out to patient to learn if she has had genetic testing.  I do not see documentation of this on chart review.

## 2017-10-01 NOTE — Patient Instructions (Signed)

## 2017-10-03 ENCOUNTER — Telehealth: Payer: Self-pay | Admitting: Obstetrics and Gynecology

## 2017-10-03 NOTE — Telephone Encounter (Signed)
Please reach out to patient to see if she had done genetic testing.  She has a personal history of breast cancer and desmoid tumor.   She has a family history of breast, colon, and ovarian cancer.   I do not see documentation of genetic testing, but her cancer care has been provided through [facility]

## 2017-10-04 ENCOUNTER — Telehealth: Payer: Self-pay | Admitting: Obstetrics and Gynecology

## 2017-10-04 NOTE — Telephone Encounter (Signed)
I would offer for her to have genetic counseling and potential testing at the [facility].  The testing is much more extensive than just for BRCA.

## 2017-10-04 NOTE — Telephone Encounter (Signed)
Left message to call Kaitlyn at [phone]. 

## 2017-10-04 NOTE — Telephone Encounter (Signed)
Patient states she is returning a call from [place]. No open telephone note.

## 2017-10-04 NOTE — Telephone Encounter (Signed)
Spoke with patient who states she is returning call. Advised patient voicemail was left before she returned my call earlier today. Please see telephone encounter dated 10/03/2017. Encounter closed.

## 2017-10-04 NOTE — Telephone Encounter (Signed)
Spoke with patient who states that she has had genetic testing for breast cancer. Reports BRCA testing was negative. States she does not feel she had any other genetic testing done at that time. Advised will review with Dr.Silva and return call with any further recommendations. 

## 2017-10-05 ENCOUNTER — Other Ambulatory Visit: Payer: Self-pay | Admitting: Obstetrics and Gynecology

## 2017-10-05 ENCOUNTER — Telehealth: Payer: Self-pay | Admitting: Obstetrics and Gynecology

## 2017-10-05 DIAGNOSIS — Z119 Encounter for screening for infectious and parasitic diseases, unspecified: Secondary | ICD-10-CM

## 2017-10-05 NOTE — Telephone Encounter (Signed)
Left detailed message, name identified on dpr, advised as seen below per Dr. Quincy Simmonds. Return call to office at [phone] if any additional questions. Will close encounter.

## 2017-10-05 NOTE — Telephone Encounter (Addendum)
Spoke with patient. Patient is agreeable to referral for genetics counseling and testing. Referral placed. Patient is asking if she should have Hep C testing. Has never had this testing and had 2 blood transfusions in the 70's. States CMA was going to ask Dr.Silva about this at her appointment and she never heard. Also checking on status of Prolia delivery. Advised will contact [facility] to discuss delivery status.  Spoke with Scientist, clinical (histocompatibility and immunogenetics) at [employer] [employer]. Prolia to be delivered on 10/06/2017. Patient due 10/23/2017 for next injection.  Routing to Dr.Silva to review Hep C testing.

## 2017-10-05 NOTE — Telephone Encounter (Signed)
Please let patient know that I placed a future order for hep C aby testing.  She can do this when she returns for her next Prolia injection.

## 2017-10-06 ENCOUNTER — Other Ambulatory Visit: Payer: Self-pay | Admitting: Obstetrics and Gynecology

## 2017-10-06 ENCOUNTER — Telehealth: Payer: Self-pay | Admitting: Obstetrics and Gynecology

## 2017-10-06 DIAGNOSIS — Z1231 Encounter for screening mammogram for malignant neoplasm of breast: Secondary | ICD-10-CM

## 2017-10-06 NOTE — Telephone Encounter (Signed)
Spoke with patient, advised prolia recived in office. Last prolia given 04/26/17, due 10/23/17.   Nurse visit scheduled for 10/26/17 at [facility]. Patient aware will also have Hep C labs drawn at this visit, is agreeable.  Routing to provider for final review. Patient is agreeable to disposition. Will close encounter.

## 2017-10-06 NOTE — Telephone Encounter (Signed)
°  Patient is calling to schedule her Prolia injection. Patient states the Prolia is to be delivered today to our office.

## 2017-10-26 ENCOUNTER — Ambulatory Visit (INDEPENDENT_AMBULATORY_CARE_PROVIDER_SITE_OTHER): Payer: Medicare Other | Admitting: *Deleted

## 2017-10-26 ENCOUNTER — Other Ambulatory Visit: Payer: Self-pay

## 2017-10-26 VITALS — BP 138/86 | HR 74 | Resp 14 | Ht 65.25 in | Wt 159.0 lb

## 2017-10-26 DIAGNOSIS — Z119 Encounter for screening for infectious and parasitic diseases, unspecified: Secondary | ICD-10-CM | POA: Diagnosis not present

## 2017-10-26 DIAGNOSIS — Z86010 Personal history of colonic polyps: Secondary | ICD-10-CM | POA: Diagnosis not present

## 2017-10-26 DIAGNOSIS — K5289 Other specified noninfective gastroenteritis and colitis: Secondary | ICD-10-CM | POA: Diagnosis not present

## 2017-10-26 DIAGNOSIS — M810 Age-related osteoporosis without current pathological fracture: Secondary | ICD-10-CM

## 2017-10-26 MED ORDER — DENOSUMAB 60 MG/ML ~~LOC~~ SOSY
60.0000 mg | PREFILLED_SYRINGE | Freq: Once | SUBCUTANEOUS | Status: AC
  Administered 2017-10-26: 60 mg via SUBCUTANEOUS

## 2017-10-26 NOTE — Progress Notes (Signed)
Patient in today for second Prolia injection. Patient's initial calcium level was obtained on 12/02/16.  Result: 9.1.  Last AEX: 10/01/17 BS Last BMD: 11/18/16  Result  Osteoporosis.  On Prolia.   Injection given in left arm per patient request.  Patient tolerated injection well.  Routed to provider for review.   Patient also to lab to have hepatitis C level drawn today per Dr. Quincy Simmonds.

## 2017-10-26 NOTE — Addendum Note (Signed)
Addended by: Charmayne Sheer on: 10/26/2017 10:19 AM   Modules accepted: Orders

## 2017-10-27 LAB — HEPATITIS C ANTIBODY

## 2017-11-23 ENCOUNTER — Other Ambulatory Visit: Payer: Self-pay | Admitting: Obstetrics and Gynecology

## 2017-11-23 ENCOUNTER — Ambulatory Visit
Admission: RE | Admit: 2017-11-23 | Discharge: 2017-11-23 | Disposition: A | Discharge status: Home / Self Care | Payer: Medicare Other | Source: Ambulatory Visit | Attending: Obstetrics and Gynecology | Admitting: Obstetrics and Gynecology

## 2017-11-23 DIAGNOSIS — Z1231 Encounter for screening mammogram for malignant neoplasm of breast: Secondary | ICD-10-CM

## 2017-11-23 DIAGNOSIS — C44722 Squamous cell carcinoma of skin of right lower limb, including hip: Secondary | ICD-10-CM | POA: Diagnosis not present

## 2017-11-23 DIAGNOSIS — D485 Neoplasm of uncertain behavior of skin: Secondary | ICD-10-CM | POA: Diagnosis not present

## 2017-11-23 HISTORY — DX: Malignant neoplasm of unspecified site of unspecified female breast: C50.919

## 2017-11-24 DIAGNOSIS — C44722 Squamous cell carcinoma of skin of right lower limb, including hip: Secondary | ICD-10-CM | POA: Diagnosis not present

## 2017-11-24 DIAGNOSIS — D485 Neoplasm of uncertain behavior of skin: Secondary | ICD-10-CM | POA: Diagnosis not present

## 2017-11-30 DIAGNOSIS — C44722 Squamous cell carcinoma of skin of right lower limb, including hip: Secondary | ICD-10-CM | POA: Diagnosis not present

## 2017-11-30 DIAGNOSIS — L905 Scar conditions and fibrosis of skin: Secondary | ICD-10-CM | POA: Diagnosis not present

## 2017-12-02 DIAGNOSIS — C44722 Squamous cell carcinoma of skin of right lower limb, including hip: Secondary | ICD-10-CM | POA: Diagnosis not present

## 2017-12-02 DIAGNOSIS — L905 Scar conditions and fibrosis of skin: Secondary | ICD-10-CM | POA: Diagnosis not present

## 2017-12-09 IMAGING — MG 2D DIGITAL SCREENING UNILATERAL LEFT MAMMOGRAM WITH CAD AND ADJU
6 series · 6 of 14 positions shown · non-contrast
Comparison: Previous exam(s).

CLINICAL DATA: Screening.

EXAM:
2D DIGITAL SCREENING UNILATERAL LEFT MAMMOGRAM WITH CAD AND ADJUNCT
TOMO

[L MLO synth-2D]
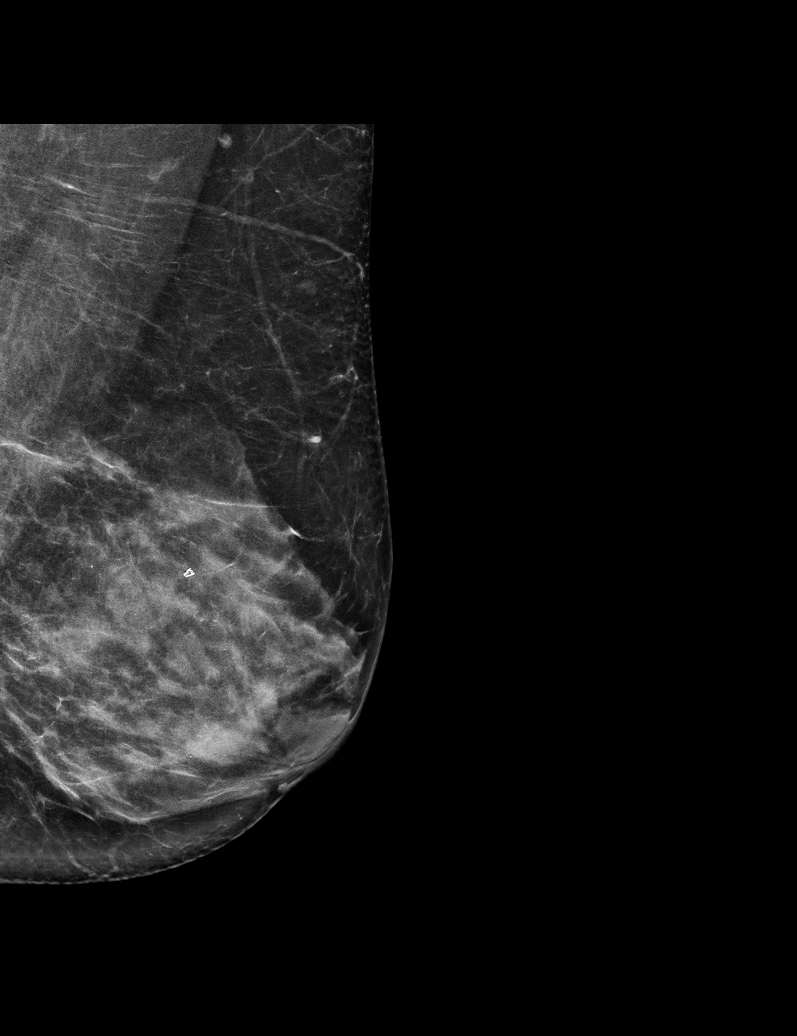

[L CC]
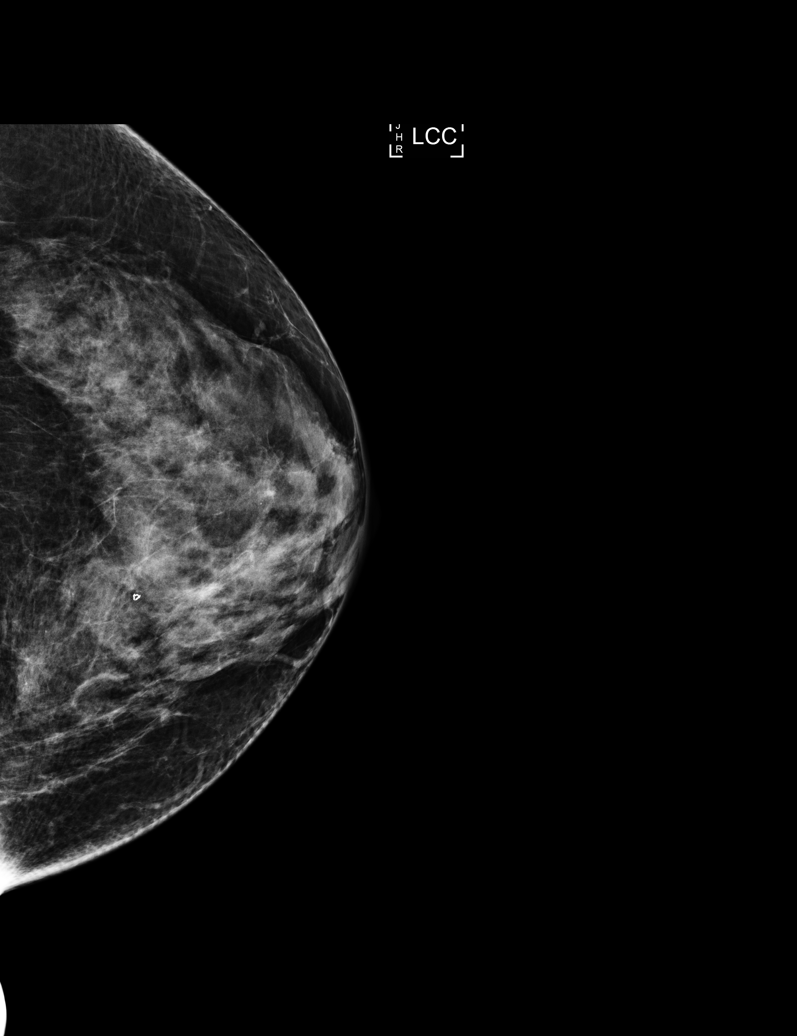

[L MLO]
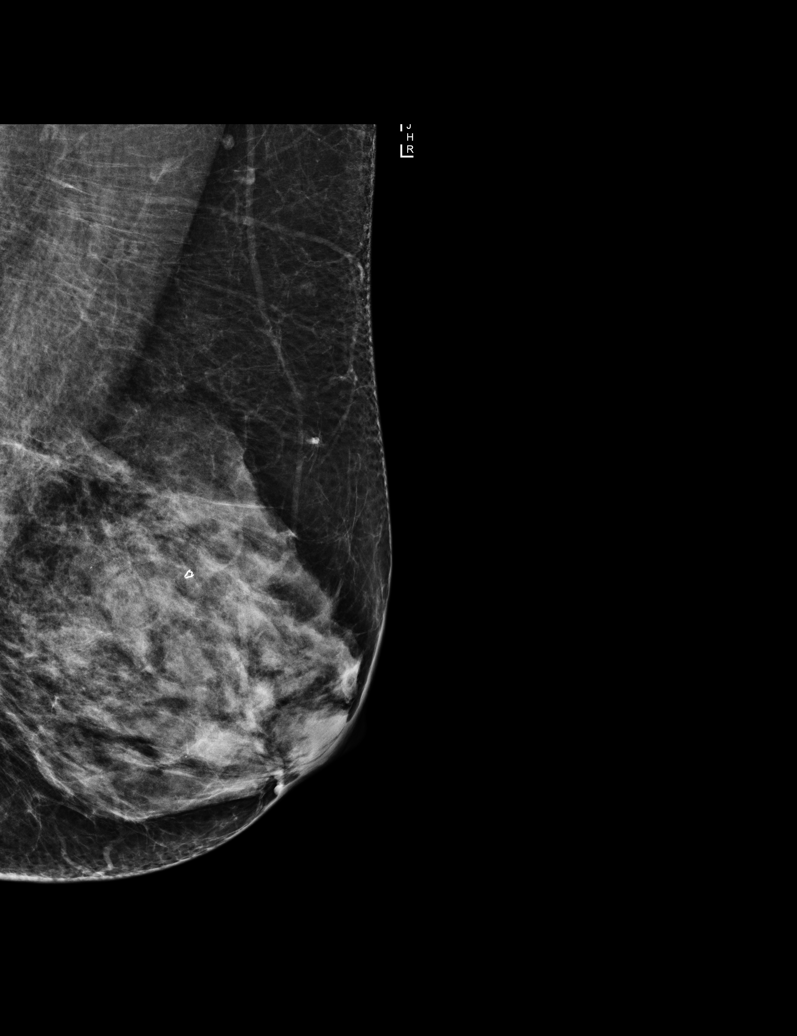

[L CC synth-2D]
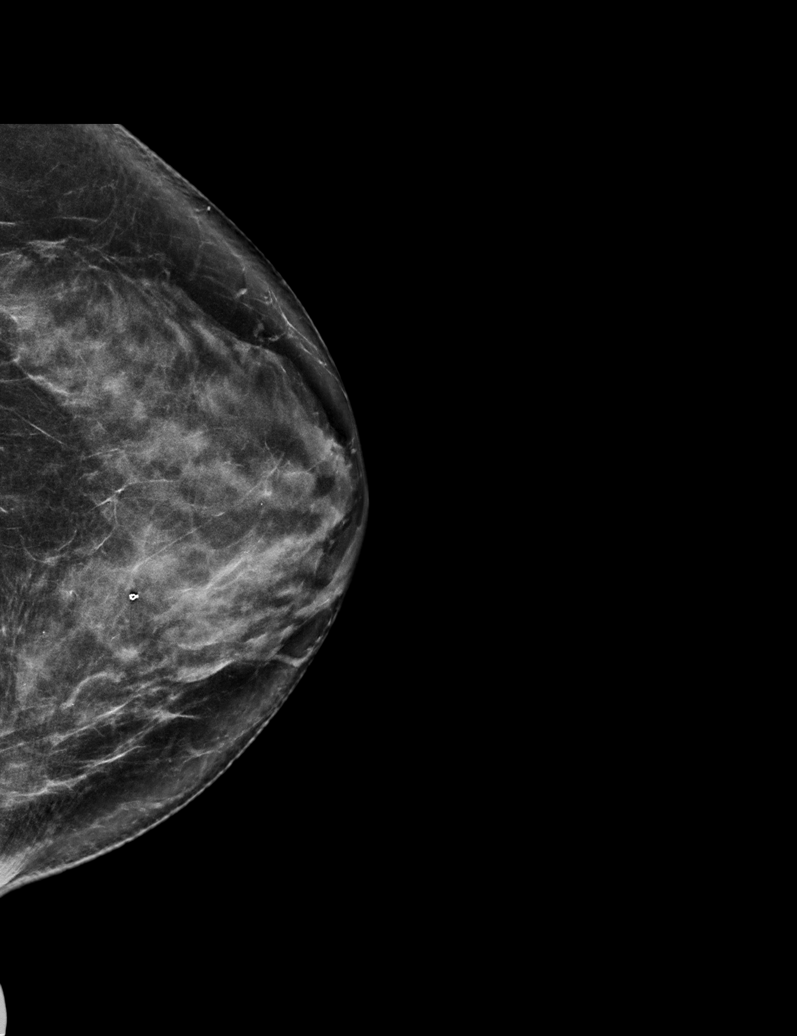

[L MLO tomo · tomo slice 35/68.0]
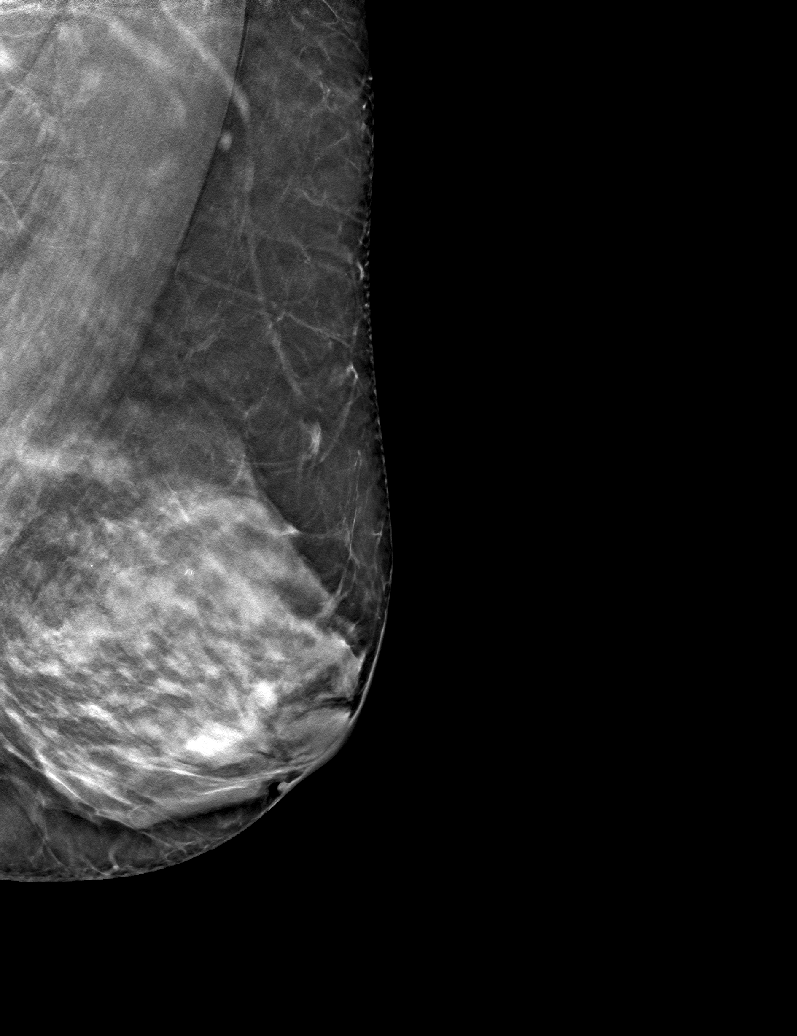

[L CC tomo · tomo slice 35/69.0]
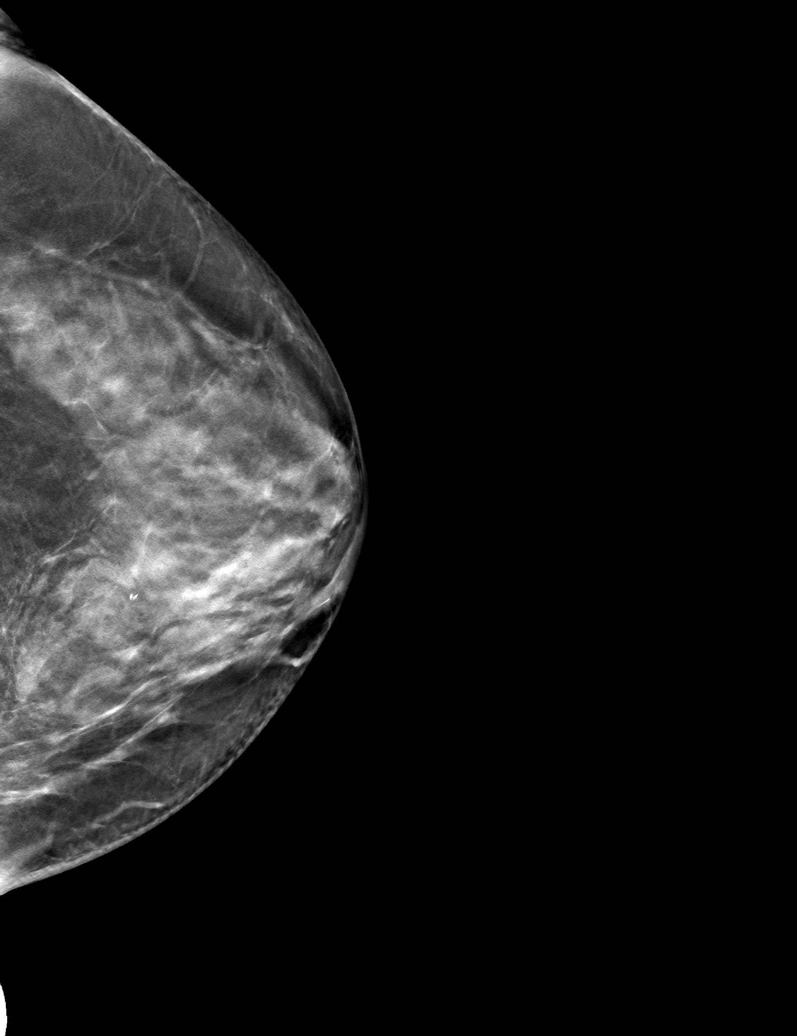

[6 of 14 positions shown; findings below may reference images not displayed]

ACR Breast Density Category d: The breast tissue is extremely dense,
which lowers the sensitivity of mammography.
FINDINGS: There are no findings suspicious for malignancy. Images were
processed with CAD.
IMPRESSION: No mammographic evidence of malignancy. A result letter of this
screening mammogram will be mailed directly to the patient.

RECOMMENDATION:
Screening mammogram in one year. (Code:[id number]-[id number])

BI-RADS CATEGORY  1: Negative.

## 2018-02-14 DIAGNOSIS — H40033 Anatomical narrow angle, bilateral: Secondary | ICD-10-CM | POA: Diagnosis not present

## 2018-02-24 ENCOUNTER — Telehealth: Payer: Self-pay | Admitting: Obstetrics and Gynecology

## 2018-02-24 NOTE — Telephone Encounter (Signed)
Patient left voicemail over lunch stating that she is due for her Prolia shot in October, but received a letter in the mail from [employer]. Patient is requesting assistance with the information that the insurance company needs.

## 2018-02-24 NOTE — Telephone Encounter (Signed)
Pt calling. Her prolia  Prior authorization will expire in October before her next shot is due.   Advised will send prolia prior authorization. Authorization request sent through covermymeds. Key ID:  [id number]

## 2018-02-25 NOTE — Telephone Encounter (Signed)
Per Cover[employer]. Request is too early. Need to resubmit no earlier than 03/01/18.

## 2018-03-02 NOTE — Telephone Encounter (Signed)
Resubmitted request for Prolia coverage at this time.

## 2018-03-03 ENCOUNTER — Telehealth: Payer: Self-pay | Admitting: *Deleted

## 2018-03-03 NOTE — Telephone Encounter (Signed)
PA approval received for Prolia. Valid 01/31/18 -03/02/19.   Last BMD 11/18/16: Osteoporosis  Last Prolia 10/26/17. Due on or after 04/24/18.   Current Rx for prolia will expire.   Dr. Quincy Simmonds ok to send new Rx for Prolia 60mg /ml. Inject into skin once q6 months in upper arm, thigh or abdomen. #1/1RF

## 2018-03-04 MED ORDER — DENOSUMAB 60 MG/ML ~~LOC~~ SOSY: 60.0000 mg | PREFILLED_SYRINGE | SUBCUTANEOUS | 1 refills | Status: DC

## 2018-03-04 NOTE — Telephone Encounter (Signed)
Spoke with patient, advised of Prolia PA approval and new Prolia Rx sent to [facility]. Patient is aware when next prolia is due and process for requesting medication. Advised to return call to office if any additional assistance is needed. Patient verbalizes understanding and is agreeable. Encounter closed.

## 2018-03-04 NOTE — Telephone Encounter (Signed)
Rx placed for Prolia.   Call to patient, Left message to call Sharee Pimple at [phone].

## 2018-03-04 NOTE — Telephone Encounter (Signed)
Patient is returning a call to Jill. °

## 2018-03-04 NOTE — Telephone Encounter (Signed)
[facility] [place] for refill of Prolia for one year as you have outlined.  Thank you!

## 2018-04-14 ENCOUNTER — Telehealth: Payer: Self-pay | Admitting: Obstetrics and Gynecology

## 2018-04-14 NOTE — Telephone Encounter (Signed)
Message left to return call to [facility] at [phone].   Need to move appointment for prolia to after 04-27-18 as patient last received on 10-26-17.

## 2018-04-14 NOTE — Telephone Encounter (Signed)
Spoke with patient. Last prolia received 10/26/17, next prolia due on or after 04/24/18.   Nurse visit rescheduled to 04/28/18 at [facility] to provider for final review. Patient is agreeable to disposition. Will close encounter.

## 2018-04-14 NOTE — Telephone Encounter (Signed)
Call to patient. Patient ready to schedule prolia injection. Confirmed medication has been delivered to our office. Patient asking to scheduled next Thursday. Patient scheduled for 04-21-18 at 0930. Patient agreeable to date and time of appointment.   Routing to provider and will close encounter.

## 2018-04-14 NOTE — Telephone Encounter (Signed)
Patient called to schedule her next Prolia injection.

## 2018-04-21 ENCOUNTER — Ambulatory Visit: Payer: Medicare Other

## 2018-04-27 DIAGNOSIS — D120 Benign neoplasm of cecum: Secondary | ICD-10-CM | POA: Diagnosis not present

## 2018-04-27 DIAGNOSIS — K5730 Diverticulosis of large intestine without perforation or abscess without bleeding: Secondary | ICD-10-CM | POA: Diagnosis not present

## 2018-04-27 DIAGNOSIS — Z86010 Personal history of colonic polyps: Secondary | ICD-10-CM | POA: Diagnosis not present

## 2018-04-28 ENCOUNTER — Ambulatory Visit (INDEPENDENT_AMBULATORY_CARE_PROVIDER_SITE_OTHER): Payer: Medicare Other

## 2018-04-28 VITALS — BP 122/72 | HR 66 | Resp 14 | Ht 65.0 in | Wt 157.0 lb

## 2018-04-28 DIAGNOSIS — M810 Age-related osteoporosis without current pathological fracture: Secondary | ICD-10-CM

## 2018-04-28 MED ORDER — DENOSUMAB 60 MG/ML ~~LOC~~ SOSY
60.0000 mg | PREFILLED_SYRINGE | Freq: Once | SUBCUTANEOUS | Status: AC
  Administered 2018-04-28: 60 mg via SUBCUTANEOUS

## 2018-04-28 NOTE — Progress Notes (Signed)
Patient in today for third Prolia injection. Patient's initial calcium level was obtained on 12/02/16.  Result: 9.1  Last AEX: 10/11/17 BS Last BMD: 11/18/16 Results 9.1  Injection given in right arm.  Patient tolerated injection well.  Routed to provider for review.

## 2018-04-29 DIAGNOSIS — D120 Benign neoplasm of cecum: Secondary | ICD-10-CM | POA: Diagnosis not present

## 2018-07-12 DIAGNOSIS — Z1159 Encounter for screening for other viral diseases: Secondary | ICD-10-CM | POA: Diagnosis not present

## 2018-07-12 DIAGNOSIS — Z0000 Encounter for general adult medical examination without abnormal findings: Secondary | ICD-10-CM | POA: Diagnosis not present

## 2018-07-12 DIAGNOSIS — E7800 Pure hypercholesterolemia, unspecified: Secondary | ICD-10-CM | POA: Diagnosis not present

## 2018-07-12 DIAGNOSIS — M810 Age-related osteoporosis without current pathological fracture: Secondary | ICD-10-CM | POA: Diagnosis not present

## 2018-07-12 DIAGNOSIS — R5383 Other fatigue: Secondary | ICD-10-CM | POA: Diagnosis not present

## 2018-09-05 DIAGNOSIS — Z86018 Personal history of other benign neoplasm: Secondary | ICD-10-CM | POA: Diagnosis not present

## 2018-09-05 DIAGNOSIS — L821 Other seborrheic keratosis: Secondary | ICD-10-CM | POA: Diagnosis not present

## 2018-09-05 DIAGNOSIS — Z85820 Personal history of malignant melanoma of skin: Secondary | ICD-10-CM | POA: Diagnosis not present

## 2018-09-05 DIAGNOSIS — Z85828 Personal history of other malignant neoplasm of skin: Secondary | ICD-10-CM | POA: Diagnosis not present

## 2018-09-05 DIAGNOSIS — Z23 Encounter for immunization: Secondary | ICD-10-CM | POA: Diagnosis not present

## 2018-09-05 DIAGNOSIS — D225 Melanocytic nevi of trunk: Secondary | ICD-10-CM | POA: Diagnosis not present

## 2018-09-05 DIAGNOSIS — L814 Other melanin hyperpigmentation: Secondary | ICD-10-CM | POA: Diagnosis not present

## 2018-09-29 ENCOUNTER — Telehealth: Payer: Self-pay | Admitting: *Deleted

## 2018-09-29 NOTE — Telephone Encounter (Signed)
Spoke with patient, advised as seen below. Nurse visit scheduled for 10/26/18 at [place] [facility] to provider for final review. Patient is agreeable to disposition. Will close encounter.

## 2018-09-29 NOTE — Telephone Encounter (Signed)
Spoke with Oley Balm at [employer], calling to schedule delivery of Prolia. Prolia scheduled for delivery to [facility] on 10/04/18.    Last prolia received 04/28/18, third injection. Due on or after 10/25/18

## 2018-10-05 ENCOUNTER — Telehealth: Payer: Self-pay | Admitting: Obstetrics and Gynecology

## 2018-10-11 NOTE — Telephone Encounter (Signed)
Appt 10/26/18 for Prolia injection.

## 2018-10-26 ENCOUNTER — Ambulatory Visit: Payer: Self-pay

## 2018-10-27 ENCOUNTER — Ambulatory Visit (INDEPENDENT_AMBULATORY_CARE_PROVIDER_SITE_OTHER): Payer: Medicare Other

## 2018-10-27 ENCOUNTER — Other Ambulatory Visit: Payer: Self-pay

## 2018-10-27 VITALS — BP 120/80 | HR 68 | Temp 98.0°F | Resp 16 | Wt 162.0 lb

## 2018-10-27 DIAGNOSIS — Z8739 Personal history of other diseases of the musculoskeletal system and connective tissue: Secondary | ICD-10-CM

## 2018-10-27 MED ORDER — DENOSUMAB 60 MG/ML ~~LOC~~ SOSY
60.0000 mg | PREFILLED_SYRINGE | Freq: Once | SUBCUTANEOUS | Status: AC
  Administered 2018-10-27: 10:00:00 60 mg via SUBCUTANEOUS

## 2018-10-27 NOTE — Progress Notes (Signed)
Patient in today for fourth Prolia injection. Patient's initial calcium level was obtained on 12-02-16.  Result: 9.1.  09-29-2018 calcium 9.5  Last AEX: 10-11-17 with Brook Silva,MD Last BMD: 11-18-16  Injection given in left arm.  Patient tolerated injection well.  Routed to provider for review.

## 2018-10-28 ENCOUNTER — Ambulatory Visit: Payer: Medicare Other | Admitting: Obstetrics and Gynecology

## 2018-12-07 ENCOUNTER — Other Ambulatory Visit: Payer: Self-pay | Admitting: Obstetrics and Gynecology

## 2018-12-07 DIAGNOSIS — Z1231 Encounter for screening mammogram for malignant neoplasm of breast: Secondary | ICD-10-CM

## 2018-12-14 ENCOUNTER — Ambulatory Visit
Admission: RE | Admit: 2018-12-14 | Discharge: 2018-12-14 | Disposition: A | Discharge status: Home / Self Care | Payer: Medicare Other | Source: Ambulatory Visit | Attending: Obstetrics and Gynecology | Admitting: Obstetrics and Gynecology

## 2018-12-14 ENCOUNTER — Other Ambulatory Visit: Payer: Self-pay

## 2018-12-14 DIAGNOSIS — Z1231 Encounter for screening mammogram for malignant neoplasm of breast: Secondary | ICD-10-CM | POA: Diagnosis not present

## 2018-12-21 ENCOUNTER — Other Ambulatory Visit: Payer: Self-pay

## 2018-12-21 NOTE — Progress Notes (Signed)
73 y.o. G52P2002 Married Caucasian female here for annual exam.    No changes in health over the last year.   Thinks she inhaled a small chocolate bar 2 - 3 weeks ago.   Taking Prolia for osteoporosis.  Next injection is due in October.  PCP:   Milagros Evener, MD   Patient's last menstrual period was 04/29/1978 (within years).           Sexually active: No.  The current method of family planning is status post hysterectomy.    Exercising: No.  The patient does not participate in regular exercise at present. Smoker:  no  Health Maintenance: Pap:  07-17-12 negative, HR HPV negative  History of abnormal Pap:  no MMG:  12-14-2018 density C/BIRADS 1 negative  Colonoscopy: 04-27-18 normal per patient, Dr. Watt Climes  BMD:   11-18-16  Result  Osteoporosis  TDaP:  07-02-17  Gardasil:   n/a HIV: unsure  Hep C: done in the past per patient Screening Labs:  Hb today: PCP, Urine today: not collected   reports that she has never smoked. She has never used smokeless tobacco. She reports that she does not drink alcohol or use drugs.  Past Medical History:  Diagnosis Date  . Blood transfusion without reported diagnosis 1970's   multiple--following desmoid tumor surgery  . Breast cancer ([place])   . Cancer ([place]) 1997   Rt. breast ca  . Heart murmur   . Heart murmur   . History of breast cancer   . Incisional hernia   . Lichen sclerosus    --vulva  . Low vitamin D level 2018  . Osteoporosis 2018  . 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 Tendonitis of ankle, left   . Ulcerative colitis [facility])     Past Surgical History:  Procedure Laterality Date  . ABDOMINAL HYSTERECTOMY  1979   TAH/BSO  . AUGMENTATION MAMMAPLASTY Right   . BREAST SURGERY  1997   Rt. mastectomy/reconstruction  . 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 MASTECTOMY Right 1997  . melanoma removal  2002   --rt. thigh  . NEPHRECTOMY  1979  . SIGMOID RESECTION / RECTOPEXY    . 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 TONSILLECTOMY AND ADENOIDECTOMY      Current Outpatient Medications  Medication Sig Dispense Refill  . Acetaminophen (TYLENOL) 325 MG CAPS Take by mouth as needed.    . Cholecalciferol (VITAMIN D3) 1000 units CAPS Take 1 capsule by mouth daily.    . clobetasol ointment (TEMOVATE) 8.65 % APPLY 1 APPLICATION TOPICALLY ONCE WEEKLY. 30 g 0  . denosumab (PROLIA) 60 MG/ML SOSY injection Inject 60 mg into the skin every 6 (six) months. 1 mL 1  . Loratadine (CLARITIN PO) Take by mouth as needed.    Marland Kitchen MAGNESIUM CITRATE PO Take by mouth daily.    Marland Kitchen MELATONIN PO Take by mouth as needed.     . mesalamine (LIALDA) 1.2 G EC tablet Take 1.2 g by mouth daily with breakfast.    . Multiple Vitamin (MULTIVITAMIN) capsule Take 1 capsule by mouth daily.     No current facility-administered medications for this visit.     Family History  Problem Relation Age of Onset  . Ovarian cancer Sister 64       dec  . Cancer Sister   . Deep vein thrombosis Mother   . Cancer Father   .  Hypertension Father   . Cancer Maternal Grandmother 90       colon  . Transient ischemic attack Maternal Grandmother   . Breast cancer Paternal Grandmother 70    Review of Systems  Constitutional: Negative.   HENT: Negative.   Eyes: Negative.   Respiratory: Negative.   Cardiovascular: Negative.   Gastrointestinal: Negative.   Endocrine: Negative.   Genitourinary: Negative.   Musculoskeletal: Negative.   Skin: Negative.   Allergic/Immunologic: Negative.   Neurological: Negative.   Hematological: Negative.   Psychiatric/Behavioral: Negative.     Exam:   BP 120/80 (BP Location: Left Arm, Patient Position: Sitting, Cuff Size: Normal)   Pulse 78   Temp (!) 97.2 F (36.2 C) (Oral)   Resp 14   Ht 5' 5.25" (1.657 m)   Wt 163 lb 8 oz (74.2 kg)   LMP 04/29/1978 (Within Years)   BMI 27.00 kg/m     General appearance: alert, cooperative and appears stated  age Head: normocephalic, without obvious abnormality, atraumatic Neck: no adenopathy, supple, symmetrical, trachea midline and thyroid normal to inspection and palpation Lungs: clear to auscultation bilaterally Breasts: left - normal appearance, no masses or tenderness, No nipple retraction or dimpling, No nipple discharge or bleeding, No axillary adenopathy Right - absent and implant in place. Heart: regular rate and rhythm Abdomen: soft, non-tender; no masses, no organomegaly.  Hernia.  Extremities: extremities normal, atraumatic, no cyanosis or edema Skin: skin color, texture, turgor normal. No rashes or lesions Lymph nodes: cervical, supraclavicular, and axillary nodes normal. Neurologic: grossly normal  Pelvic: External genitalia:fusion of the labia minora to majora bilaterally.  Minor ecchymoses of the vestibule.               No abnormal inguinal nodes palpated.              Urethra:  normal appearing urethra with no masses, tenderness or lesions              Bartholins and Skenes: normal                 Vagina: normal appearing vagina with normal color and discharge, no lesions              Cervix:  Absent.              Pap taken: No. Bimanual Exam:  Uterus:   Absent.               Adnexa: no mass, fullness, tenderness              Rectal exam:  Not able to tolerate.  Chaperone was present for exam.  Assessment:   Well woman visit with normal exam. Status post TAH/BSO.  Hx sarcoma of left leg.  Status post right mastectomy for breast cancer.  FH breast, ovarian, colon cancer. Patient is BRCA negative.  Osteoporosis.  On Prolia.  Lichen sclerosus. Controlled with clobetasol. Incisional hernia. Hx nephrectomy.  Plan: Mammogram screening discussed. Self breast awareness reviewed. Pap and HR HPV as above. Guidelines for Calcium, Vitamin D, regular exercise program including cardiovascular and weight bearing exercise. BMD at [facility].  Referral to Dr. Dwyane Dee for  osteoporosis care.  Refill of Clobetasol.  Can use twice a day for 2 weeks at a time.  Follow up annually and prn.  After visit summary provided.

## 2018-12-23 ENCOUNTER — Other Ambulatory Visit: Payer: Self-pay

## 2018-12-23 ENCOUNTER — Ambulatory Visit (INDEPENDENT_AMBULATORY_CARE_PROVIDER_SITE_OTHER): Payer: Medicare Other | Admitting: Obstetrics and Gynecology

## 2018-12-23 ENCOUNTER — Encounter: Payer: Self-pay | Admitting: Obstetrics and Gynecology

## 2018-12-23 VITALS — BP 120/80 | HR 78 | Temp 97.2°F | Resp 14 | Ht 65.25 in | Wt 163.5 lb

## 2018-12-23 DIAGNOSIS — M810 Age-related osteoporosis without current pathological fracture: Secondary | ICD-10-CM

## 2018-12-23 DIAGNOSIS — Z01419 Encounter for gynecological examination (general) (routine) without abnormal findings: Secondary | ICD-10-CM

## 2018-12-23 MED ORDER — CLOBETASOL PROPIONATE 0.05 % EX OINT: TOPICAL_OINTMENT | CUTANEOUS | 1 refills | Status: DC

## 2018-12-23 NOTE — Patient Instructions (Signed)

## 2018-12-28 ENCOUNTER — Ambulatory Visit
Admission: RE | Admit: 2018-12-28 | Discharge: 2018-12-28 | Disposition: A | Discharge status: Home / Self Care | Payer: Medicare Other | Source: Ambulatory Visit | Attending: Obstetrics and Gynecology | Admitting: Obstetrics and Gynecology

## 2018-12-28 ENCOUNTER — Other Ambulatory Visit: Payer: Self-pay

## 2018-12-28 DIAGNOSIS — Z780 Asymptomatic menopausal state: Secondary | ICD-10-CM | POA: Diagnosis not present

## 2018-12-28 DIAGNOSIS — M810 Age-related osteoporosis without current pathological fracture: Secondary | ICD-10-CM

## 2018-12-28 DIAGNOSIS — M8589 Other specified disorders of bone density and structure, multiple sites: Secondary | ICD-10-CM | POA: Diagnosis not present

## 2019-01-01 DIAGNOSIS — N390 Urinary tract infection, site not specified: Secondary | ICD-10-CM | POA: Diagnosis not present

## 2019-01-01 DIAGNOSIS — R300 Dysuria: Secondary | ICD-10-CM | POA: Diagnosis not present

## 2019-01-02 ENCOUNTER — Telehealth: Payer: Self-pay

## 2019-01-02 NOTE — Telephone Encounter (Signed)
-----   Message from Nunzio Cobbs, MD sent at 01/02/2019 12:57 PM EDT ----- Please report results of her BMD which now show osteopenia and not osteoporosis.  Her forearm and thighs are improved since 2018.  She will need to see an endocrinologist for continuation of Prolia.  I did place a referral to Dr. Dwyane Dee.

## 2019-01-02 NOTE — Telephone Encounter (Signed)
Left message to call [facility] or Triage nurse.

## 2019-01-03 NOTE — Telephone Encounter (Signed)
Patient returned call

## 2019-01-03 NOTE — Telephone Encounter (Signed)
Call to patient. Message given to patient as seen below from Dr. Quincy Simmonds and patient verbalized understanding. Aware will be contact by referral department for scheduling appointment with Dr. Dwyane Dee.   Encounter closed.

## 2019-01-10 DIAGNOSIS — R350 Frequency of micturition: Secondary | ICD-10-CM | POA: Diagnosis not present

## 2019-01-23 ENCOUNTER — Telehealth: Payer: Self-pay | Admitting: Obstetrics and Gynecology

## 2019-01-23 ENCOUNTER — Other Ambulatory Visit: Payer: Self-pay

## 2019-01-23 ENCOUNTER — Encounter: Payer: Self-pay | Admitting: Certified Nurse Midwife

## 2019-01-23 ENCOUNTER — Ambulatory Visit (INDEPENDENT_AMBULATORY_CARE_PROVIDER_SITE_OTHER): Payer: Medicare Other | Admitting: Certified Nurse Midwife

## 2019-01-23 VITALS — BP 138/80 | HR 68 | Temp 97.2°F | Resp 16 | Wt 163.0 lb

## 2019-01-23 DIAGNOSIS — N904 Leukoplakia of vulva: Secondary | ICD-10-CM

## 2019-01-23 DIAGNOSIS — N949 Unspecified condition associated with female genital organs and menstrual cycle: Secondary | ICD-10-CM

## 2019-01-23 DIAGNOSIS — R3915 Urgency of urination: Secondary | ICD-10-CM | POA: Diagnosis not present

## 2019-01-23 LAB — POCT URINALYSIS DIPSTICK
Bilirubin, UA: NEGATIVE
Glucose, UA: NEGATIVE
Ketones, UA: NEGATIVE
Leukocytes, UA: NEGATIVE
Nitrite, UA: NEGATIVE
Protein, UA: NEGATIVE
Urobilinogen, UA: NEGATIVE E.U./dL — AB
pH, UA: 5 (ref 5.0–8.0)

## 2019-01-23 NOTE — Telephone Encounter (Signed)
Spoke with patient.   1. Patient was tx for UTI at walk-in clinic on 01/01/19 and for possible yeast on 7/17. Completed abx and diflucan x2. Reports vaginal irritation and feels swollen. Denies vaginal bleeding,  d/c, odor or urinary symptoms. Advised OV needed for further evaluation, offered OV today with Melvia Heaps, CNM at 4pm, patient agreeable. [id number] prescreen negative, precautions reviewed.   2. Patient requesting f/u on referral placed to [facility]? Advised I will forward to referral coordinator to f/u and return call, patient agreeable.   Routing to [employer]  to f/u on referral.   Cc: Melvia Heaps, CNM, Dr. Quincy Simmonds

## 2019-01-23 NOTE — Progress Notes (Signed)
73 y.o. Married Caucasian female G2P2002 here with complaint of vaginal symptoms of itching and, burning, Describes discharge as none. Onset of symptoms 11 days ago. Denies new personal products or vaginal dryness. Patient was treated for UTI and had urine rechecked on 01-10-2019 and was negative. Was given Diflucan x 2 and completed 7- 20-20.   Urinary symptoms none.  Review of Systems  Constitutional: Negative.   HENT: Negative.   Eyes: Negative.   Respiratory: Negative.   Cardiovascular: Negative.   Gastrointestinal: Negative.   Genitourinary: Positive for urgency.  Musculoskeletal: Negative.   Skin:       Vaginal irritation  Neurological: Negative.   Endo/Heme/Allergies: Negative.   Psychiatric/Behavioral: Negative.     O:Healthy female WDWN Affect: normal, orientation x 3  Exam: Skin warm and dry CVAT bilateral non tender Abdomen: soft, non tender, suprapubic tenderness not present  Inguinal Lymph nodes: no enlargement or tenderness Inguinal hernia noted on left(known) Pelvic exam: External genital: Atrophic appearance with scant lace like appearance on vulva bilateral. Clitoral area fused with urinary meatus noted under fused area, non tender, not red, Urethra and bladder non tender BUS: negative Vagina: no discharge, atrophic appearance noted. Affirm taken Cervix: normal, non tender, no CMT Uterus: normal, non tender Adnexa:normal, non tender, no masses or fullness noted   Wet Prep results: poct urine-rbc 2+ unable to collect urine, patient feels UTI has resolved. Scant blood noted on irritated skin only.  A:Normal pelvic exam Atrophic vaginitis History of Lichen Sclerosis, flare noted   P:Discussed findings of no signs of UTI. Shown area of concern on vulva and Lichen flare area. Discussed Aveeno or  sitz bath or rinse for comfort. Avoid moist clothes or pads for extended period of time. Instructed to start Clobetasol cream again once daily after using Aveeno rinse.  Avoid underwear when possible. Warning signs of UTI given. Lab: Affirm  Rv prn

## 2019-01-23 NOTE — Telephone Encounter (Signed)
Patient is calling regarding a yeast infection.

## 2019-01-23 NOTE — Telephone Encounter (Signed)
Thank you for the update!

## 2019-01-24 LAB — VAGINITIS/VAGINOSIS, DNA PROBE
Candida Species: NEGATIVE
Gardnerella vaginalis: NEGATIVE
Trichomonas vaginosis: NEGATIVE

## 2019-01-25 ENCOUNTER — Telehealth: Payer: Self-pay

## 2019-01-25 NOTE — Telephone Encounter (Signed)
Patient is returning a call to [place].

## 2019-01-25 NOTE — Telephone Encounter (Signed)
-----   Message from Regina Eck, CNM sent at 01/25/2019  7:43 AM EDT ----- Notify patient her vaginal screening was negative for yeast, BV and trichomonas, feel her issues were the Lichen flaring. Patient status?

## 2019-01-25 NOTE — Telephone Encounter (Signed)
Left message for call back.

## 2019-01-25 NOTE — Telephone Encounter (Signed)
Patient notified of results as written by provider 

## 2019-02-07 ENCOUNTER — Telehealth: Payer: Self-pay | Admitting: Obstetrics and Gynecology

## 2019-02-07 NOTE — Telephone Encounter (Signed)
Patient calling regarding referral from [facility].

## 2019-02-09 DIAGNOSIS — R319 Hematuria, unspecified: Secondary | ICD-10-CM | POA: Diagnosis not present

## 2019-02-15 ENCOUNTER — Ambulatory Visit: Payer: Medicare Other | Admitting: Internal Medicine

## 2019-02-16 DIAGNOSIS — H43813 Vitreous degeneration, bilateral: Secondary | ICD-10-CM | POA: Diagnosis not present

## 2019-02-16 DIAGNOSIS — H52203 Unspecified astigmatism, bilateral: Secondary | ICD-10-CM | POA: Diagnosis not present

## 2019-02-16 DIAGNOSIS — H5213 Myopia, bilateral: Secondary | ICD-10-CM | POA: Diagnosis not present

## 2019-02-16 DIAGNOSIS — H40033 Anatomical narrow angle, bilateral: Secondary | ICD-10-CM | POA: Diagnosis not present

## 2019-02-19 NOTE — Progress Notes (Signed)
Patient ID: Megan Mahoney, female   DOB: 1945-08-09, 73 y.o.   MRN: [id number]           Referring Physician: Dr. Josefa Half  Today's office visit was provided via telemedicine using video technique The patient was explained the limitations of evaluation and management by telemedicine and the availability of in person appointments.  The patient understood the limitations and agreed to proceed. Patient also understood that the telehealth visit is billable. . Location of the patient: Patient's home . Location of the provider: Physician office Only the patient and myself were participating in the encounter   Chief complaint: Management of osteoporosis  History of Present Illness:  The patient is referred here for osteoporosis management.  She had serial bone densities done and this showed the following T-scores:  Left Forearm Radius 33% 12/28/2018 70.9 -2.3 0.679 g/cm2 Left Forearm Radius 33% 11/18/2016 68.8 -2.7 0.652 g/cm2 Forearm in 2016 -2.3  DualFemur Neck Right 12/28/2018 70.9 -1.9 0.768 g/cm2 DualFemur Neck Right 11/18/2016 68.8 -2.2 0.734 g/cm2  DualFemur Total Mean 12/28/2018 70.9 -1.3 0.844 g/cm2 * DualFemur Total Mean 11/18/2016 68.8 -1.6 0.805 g/cm2  Conclusion from bone density in 2020: There has been a statistically significant increase in BMD of Total Mean hips since prior exam dated 11/18/2016.   Lumbar spine was not utilized due to advanced degenerative changes.  She has no history of low trauma fracture; she had fracture of her foot when she was about 73 years old She thinks she has had 2-2.5 inch height loss since her youth and was about 5 feet 8 inches previously  Age at menopause: 48 Subsequently she was given HRT until the age of 71 when she had to stop because of diagnosis of breast cancer  Current treatment: Prolia 60 mg every 6 months since about 03/2017 She was started on treatment after her T score at the radius decreased down to -2.7  compared to - 2.3 Apparently was not given option for Fosamax or other bisphosphonate because of her fear of kidney problems  Vitamin D supplements: 2000 units daily Baseline level was low in 2018 when starting Prolia but no recent follow-up lab available    LABS:  Lab Results  Component Value Date   VD25OH 36.5 03/24/2017   VD25OH 27.1 (L) 01/20/2017   Lab Results  Component Value Date   CALCIUM 9.1 12/02/2016     Past Medical History:  Diagnosis Date  . Blood transfusion without reported diagnosis 1970's   multiple--following desmoid tumor surgery  . Breast cancer ([place])   . Cancer ([place]) 1997   Rt. breast ca  . Heart murmur   . Heart murmur   . History of breast cancer   . Incisional hernia   . Lichen sclerosus    --vulva  . Low vitamin D level 2018  . Osteoporosis 2018  . 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 Tendonitis of ankle, left   . Ulcerative colitis [facility])     Past Surgical History:  Procedure Laterality Date  . ABDOMINAL HYSTERECTOMY  1979   TAH/BSO  . AUGMENTATION MAMMAPLASTY Right   . BREAST SURGERY  1997   Rt. mastectomy/reconstruction  . 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 MASTECTOMY Right 1997  . melanoma removal  2002   --rt. thigh  . NEPHRECTOMY  1979  . SIGMOID RESECTION / RECTOPEXY    . SQUAMOUS CELL CARCINOMA EXCISION  2019  removed right thigh   . TONSILLECTOMY AND ADENOIDECTOMY      Family History  Problem Relation Age of Onset  . Ovarian cancer Sister 31       dec  . Cancer Sister   . Deep vein thrombosis Mother   . Cancer Father   . Hypertension Father   . Cancer Maternal Grandmother 90       colon  . Transient ischemic attack Maternal Grandmother   . Breast cancer Paternal Grandmother 45    Social History:  reports that she has never smoked. She has never used smokeless tobacco. She reports that she does not drink alcohol or use drugs.  Allergies:  Allergies  Allergen  Reactions  . Codeine Nausea And Vomiting    Allergies as of 02/20/2019      Reactions   Codeine Nausea And Vomiting      Medication List       Accurate as of February 20, 2019  4:16 PM. If you have any questions, ask your nurse or doctor.        CLARITIN PO Take by mouth as needed.   clobetasol ointment 0.05 % Commonly known as: TEMOVATE APPLY 1 APPLICATION TOPICALLY ONCE WEEKLY.  May use twice a day for 2 weeks at a time for a flare.   denosumab 60 MG/ML Sosy injection Commonly known as: PROLIA Inject 60 mg into the skin every 6 (six) months.   MELATONIN PO Take by mouth as needed.   mesalamine 1.2 g EC tablet Commonly known as: LIALDA Take 1.2 g by mouth daily with breakfast.   multivitamin capsule Take 1 capsule by mouth daily.   PROBIOTIC PO Take by mouth.   Tylenol 325 MG Caps Generic drug: Acetaminophen Take by mouth as needed.   Vitamin D3 25 MCG (1000 UT) Caps Take 1 capsule by mouth daily.        Review of Systems  Constitutional: Negative for weight loss.  HENT: Negative for trouble swallowing.   Respiratory: Negative for shortness of breath.   Cardiovascular: Negative for leg swelling.  Gastrointestinal:       She gets diarrhea about 2-3 times a month mostly related to certain fruits and vegetables.  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       No history of thyroid disease, last TSH 2.4 done in 06/2018  Genitourinary:       Previous UTI in 7/20  Musculoskeletal: Positive for back pain. Negative for joint pain.       She has had some mid and lower back pain related to arthritis at times  Neurological: Negative for weakness.     LABS:  No visits with results within 1 Week(s) from this visit.  Latest known visit with results is:  Office Visit on 01/23/2019  Component Date Value Ref Range Status  . Color, UA 01/23/2019  yellow   Final  . Clarity, UA 01/23/2019 clear   Final  . Glucose, UA 01/23/2019 Negative  Negative Final  . Bilirubin, UA 01/23/2019 n   Final  . Ketones, UA 01/23/2019 n   Final  . Blood, UA 01/23/2019 2+   Final  . pH, UA 01/23/2019 5.0  5.0 - 8.0 Final  . Protein, UA 01/23/2019 Negative  Negative Final  . Urobilinogen, UA 01/23/2019 negative* 0.2 or 1.0 E.U./dL Final  . Nitrite, UA 01/23/2019 n   Final  . Leukocytes, UA 01/23/2019  Negative  Negative Final  . Appearance 01/23/2019 yellow   Final  . Odor 01/23/2019 none   Final  . Candida Species 01/23/2019 Negative  Negative Final  . Gardnerella vaginalis 01/23/2019 Negative  Negative Final  . 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 with lowest T score of -2.7  She has not had any diagnosed osteoporosis at the hip or spine However the bone density spine was not measured because of advanced degenerative changes Although she has had height loss but not clear if this is related to loss of bone mass or intervertebral spaces  Discussed with the patient that she is not at high risk for fracture at the femur with only diagnosis of osteopenia Based on clinical guidelines she should be treated initially with bisphosphonates Currently she has had some improvement in bone density with Prolia and will benefit from continuing with bisphosphonates  Discussed that with her last creatinine level being normal at 0.9 there should be no contraindication to using bisphosphonates and she has adequate renal function despite having only 1 kidney  Vitamin D deficiency needs to be reassessed Also needs to have reassessment of her calcium level since she has been on Prolia  Other medical problems include lymphocytic colitis treated with Lialda and low back pain related to  degenerative disease treated with acetaminophen as needed    PLAN:    Start Actonel 150 mg monthly  Currently this may need prior authorization from her insurance and discussed that if her insurance wants her to try Fosamax first will have to do this  Also if she has some intolerance to oral bisphosphonates may be a candidate for annual Reclast infusion  She can start her Actonel in October before she is due for her follow-up earlier injection  Labs will be done to assess her chemistry panel and vitamin D levels   Consultation note sent to referring physician   Elayne Snare 02/20/2019, 4:16 PM

## 2019-02-20 ENCOUNTER — Encounter: Payer: Self-pay | Admitting: Endocrinology

## 2019-02-20 ENCOUNTER — Ambulatory Visit (INDEPENDENT_AMBULATORY_CARE_PROVIDER_SITE_OTHER): Payer: Medicare Other | Admitting: Endocrinology

## 2019-02-20 ENCOUNTER — Other Ambulatory Visit: Payer: Self-pay

## 2019-02-20 DIAGNOSIS — M810 Age-related osteoporosis without current pathological fracture: Secondary | ICD-10-CM | POA: Diagnosis not present

## 2019-02-20 DIAGNOSIS — E559 Vitamin D deficiency, unspecified: Secondary | ICD-10-CM | POA: Diagnosis not present

## 2019-02-20 MED ORDER — RISEDRONATE SODIUM 150 MG PO TABS: 150.0000 mg | ORAL_TABLET | ORAL | 3 refills | Status: DC

## 2019-02-21 DIAGNOSIS — R350 Frequency of micturition: Secondary | ICD-10-CM | POA: Diagnosis not present

## 2019-02-21 DIAGNOSIS — R310 Gross hematuria: Secondary | ICD-10-CM | POA: Diagnosis not present

## 2019-02-21 DIAGNOSIS — N393 Stress incontinence (female) (male): Secondary | ICD-10-CM | POA: Diagnosis not present

## 2019-02-21 DIAGNOSIS — R351 Nocturia: Secondary | ICD-10-CM | POA: Diagnosis not present

## 2019-02-22 ENCOUNTER — Other Ambulatory Visit: Payer: Self-pay

## 2019-02-22 ENCOUNTER — Other Ambulatory Visit (INDEPENDENT_AMBULATORY_CARE_PROVIDER_SITE_OTHER): Payer: Medicare Other

## 2019-02-22 DIAGNOSIS — E559 Vitamin D deficiency, unspecified: Secondary | ICD-10-CM | POA: Diagnosis not present

## 2019-02-22 DIAGNOSIS — M810 Age-related osteoporosis without current pathological fracture: Secondary | ICD-10-CM | POA: Diagnosis not present

## 2019-02-22 LAB — COMPREHENSIVE METABOLIC PANEL
ALT: 20 U/L (ref 0–35)
AST: 27 U/L (ref 0–37)
Albumin: 4.1 g/dL (ref 3.5–5.2)
Alkaline Phosphatase: 44 U/L (ref 39–117)
BUN: 17 mg/dL (ref 6–23)
CO2: 27 mEq/L (ref 19–32)
Calcium: 9.2 mg/dL (ref 8.4–10.5)
Chloride: 104 mEq/L (ref 96–112)
Creatinine, Ser: 0.95 mg/dL (ref 0.40–1.20)
GFR: 57.97 mL/min — ABNORMAL LOW (ref >60.00)
Glucose, Bld: 95 mg/dL (ref 70–99)
Potassium: 4.5 mEq/L (ref 3.5–5.1)
Sodium: 139 mEq/L (ref 135–145)
Total Bilirubin: 0.4 mg/dL (ref 0.2–1.2)
Total Protein: 7.1 g/dL (ref 6.0–8.3)

## 2019-02-22 LAB — VITAMIN D 25 HYDROXY (VIT D DEFICIENCY, FRACTURES): VITD: 40.55 ng/mL (ref 30.00–100.00)

## 2019-02-24 DIAGNOSIS — K760 Fatty (change of) liver, not elsewhere classified: Secondary | ICD-10-CM | POA: Diagnosis not present

## 2019-02-24 DIAGNOSIS — K8020 Calculus of gallbladder without cholecystitis without obstruction: Secondary | ICD-10-CM | POA: Diagnosis not present

## 2019-02-24 DIAGNOSIS — R310 Gross hematuria: Secondary | ICD-10-CM | POA: Diagnosis not present

## 2019-02-28 DIAGNOSIS — N3021 Other chronic cystitis with hematuria: Secondary | ICD-10-CM | POA: Diagnosis not present

## 2019-02-28 DIAGNOSIS — R310 Gross hematuria: Secondary | ICD-10-CM | POA: Diagnosis not present

## 2019-02-28 DIAGNOSIS — R350 Frequency of micturition: Secondary | ICD-10-CM | POA: Diagnosis not present

## 2019-03-09 ENCOUNTER — Other Ambulatory Visit: Payer: Self-pay | Admitting: Family Medicine

## 2019-03-09 DIAGNOSIS — R911 Solitary pulmonary nodule: Secondary | ICD-10-CM

## 2019-03-14 ENCOUNTER — Ambulatory Visit
Admission: RE | Admit: 2019-03-14 | Discharge: 2019-03-14 | Disposition: A | Discharge status: Home / Self Care | Payer: Medicare Other | Source: Ambulatory Visit | Attending: Family Medicine | Admitting: Family Medicine

## 2019-03-14 DIAGNOSIS — C50911 Malignant neoplasm of unspecified site of right female breast: Secondary | ICD-10-CM | POA: Diagnosis not present

## 2019-03-14 DIAGNOSIS — R911 Solitary pulmonary nodule: Secondary | ICD-10-CM | POA: Diagnosis not present

## 2019-03-14 MED ORDER — IOPAMIDOL (ISOVUE-300) INJECTION 61%
75.0000 mL | Freq: Once | INTRAVENOUS | Status: AC | PRN
  Administered 2019-03-14: 75 mL via INTRAVENOUS

## 2019-03-21 ENCOUNTER — Other Ambulatory Visit: Payer: Self-pay | Admitting: Obstetrics and Gynecology

## 2019-03-21 NOTE — Telephone Encounter (Signed)
Medication refill request: Prolia Last AEX:  12/23/2018 BS Next AEX: 01/17/2020 Last MMG (if hormonal medication request): 12/14/2018 BIRADS 1 Negative Density C Refill authorized: Pending #1 60 mg/ml with no refills if appropriate. Please advise.

## 2019-03-23 ENCOUNTER — Other Ambulatory Visit (HOSPITAL_COMMUNITY): Payer: Self-pay | Admitting: Family Medicine

## 2019-03-23 ENCOUNTER — Other Ambulatory Visit: Payer: Self-pay | Admitting: Family Medicine

## 2019-03-23 DIAGNOSIS — Z853 Personal history of malignant neoplasm of breast: Secondary | ICD-10-CM

## 2019-03-31 ENCOUNTER — Other Ambulatory Visit: Payer: Self-pay

## 2019-03-31 ENCOUNTER — Encounter (HOSPITAL_COMMUNITY)
Admission: RE | Admit: 2019-03-31 | Discharge: 2019-03-31 | Disposition: A | Discharge status: Home / Self Care | Payer: Medicare Other | Source: Ambulatory Visit | Attending: Family Medicine | Admitting: Family Medicine

## 2019-03-31 DIAGNOSIS — Z905 Acquired absence of kidney: Secondary | ICD-10-CM | POA: Diagnosis not present

## 2019-03-31 DIAGNOSIS — R918 Other nonspecific abnormal finding of lung field: Secondary | ICD-10-CM | POA: Diagnosis not present

## 2019-03-31 DIAGNOSIS — Z79899 Other long term (current) drug therapy: Secondary | ICD-10-CM | POA: Diagnosis not present

## 2019-03-31 DIAGNOSIS — Z853 Personal history of malignant neoplasm of breast: Secondary | ICD-10-CM | POA: Insufficient documentation

## 2019-03-31 DIAGNOSIS — R911 Solitary pulmonary nodule: Secondary | ICD-10-CM | POA: Diagnosis not present

## 2019-03-31 LAB — GLUCOSE, CAPILLARY: Glucose-Capillary: 104 mg/dL — ABNORMAL HIGH (ref 70–99)

## 2019-03-31 MED ORDER — FLUDEOXYGLUCOSE F - 18 (FDG) INJECTION
9.2400 | Freq: Once | INTRAVENOUS | Status: AC
  Administered 2019-03-31: 14:00:00 9.24 via INTRAVENOUS

## 2019-04-03 ENCOUNTER — Ambulatory Visit (HOSPITAL_COMMUNITY): Payer: Medicare Other

## 2019-04-07 ENCOUNTER — Other Ambulatory Visit: Payer: Self-pay | Admitting: Family Medicine

## 2019-04-07 DIAGNOSIS — Z853 Personal history of malignant neoplasm of breast: Secondary | ICD-10-CM

## 2019-04-12 ENCOUNTER — Other Ambulatory Visit: Payer: Medicare Other

## 2019-04-13 ENCOUNTER — Other Ambulatory Visit: Payer: Self-pay | Admitting: Family Medicine

## 2019-04-13 DIAGNOSIS — R599 Enlarged lymph nodes, unspecified: Secondary | ICD-10-CM

## 2019-04-17 ENCOUNTER — Other Ambulatory Visit: Payer: Self-pay | Admitting: Family Medicine

## 2019-04-17 ENCOUNTER — Other Ambulatory Visit: Payer: Self-pay

## 2019-04-17 ENCOUNTER — Ambulatory Visit
Admission: RE | Admit: 2019-04-17 | Discharge: 2019-04-17 | Disposition: A | Discharge status: Home / Self Care | Payer: Medicare Other | Source: Ambulatory Visit | Attending: Family Medicine | Admitting: Family Medicine

## 2019-04-17 DIAGNOSIS — Z853 Personal history of malignant neoplasm of breast: Secondary | ICD-10-CM | POA: Diagnosis not present

## 2019-04-17 DIAGNOSIS — R590 Localized enlarged lymph nodes: Secondary | ICD-10-CM | POA: Diagnosis not present

## 2019-04-17 DIAGNOSIS — R599 Enlarged lymph nodes, unspecified: Secondary | ICD-10-CM

## 2019-04-18 ENCOUNTER — Other Ambulatory Visit: Payer: Self-pay

## 2019-04-18 ENCOUNTER — Ambulatory Visit
Admission: RE | Admit: 2019-04-18 | Discharge: 2019-04-18 | Disposition: A | Discharge status: Home / Self Care | Payer: Medicare Other | Source: Ambulatory Visit | Attending: Family Medicine | Admitting: Family Medicine

## 2019-04-18 DIAGNOSIS — Z853 Personal history of malignant neoplasm of breast: Secondary | ICD-10-CM | POA: Diagnosis not present

## 2019-04-18 DIAGNOSIS — R599 Enlarged lymph nodes, unspecified: Secondary | ICD-10-CM

## 2019-04-18 DIAGNOSIS — R590 Localized enlarged lymph nodes: Secondary | ICD-10-CM | POA: Diagnosis not present

## 2019-04-19 DIAGNOSIS — N3021 Other chronic cystitis with hematuria: Secondary | ICD-10-CM | POA: Diagnosis not present

## 2019-04-19 DIAGNOSIS — R310 Gross hematuria: Secondary | ICD-10-CM | POA: Diagnosis not present

## 2019-04-21 ENCOUNTER — Other Ambulatory Visit: Payer: Self-pay | Admitting: Family Medicine

## 2019-04-21 DIAGNOSIS — G939 Disorder of brain, unspecified: Secondary | ICD-10-CM

## 2019-04-26 ENCOUNTER — Ambulatory Visit
Admission: RE | Admit: 2019-04-26 | Discharge: 2019-04-26 | Disposition: A | Discharge status: Home / Self Care | Payer: Medicare Other | Source: Ambulatory Visit | Attending: Family Medicine | Admitting: Family Medicine

## 2019-04-26 ENCOUNTER — Other Ambulatory Visit: Payer: Self-pay | Admitting: Family Medicine

## 2019-04-26 ENCOUNTER — Other Ambulatory Visit: Payer: Self-pay

## 2019-04-26 DIAGNOSIS — G939 Disorder of brain, unspecified: Secondary | ICD-10-CM

## 2019-04-26 DIAGNOSIS — G9389 Other specified disorders of brain: Secondary | ICD-10-CM | POA: Diagnosis not present

## 2019-04-26 MED ORDER — GADOBENATE DIMEGLUMINE 529 MG/ML IV SOLN
15.0000 mL | Freq: Once | INTRAVENOUS | Status: AC | PRN
  Administered 2019-04-26: 08:00:00 15 mL via INTRAVENOUS

## 2019-04-27 DIAGNOSIS — R590 Localized enlarged lymph nodes: Secondary | ICD-10-CM | POA: Diagnosis not present

## 2019-05-04 ENCOUNTER — Other Ambulatory Visit: Payer: Self-pay | Admitting: General Surgery

## 2019-05-04 DIAGNOSIS — R590 Localized enlarged lymph nodes: Secondary | ICD-10-CM

## 2019-05-08 ENCOUNTER — Other Ambulatory Visit: Payer: Self-pay | Admitting: General Surgery

## 2019-05-08 DIAGNOSIS — R590 Localized enlarged lymph nodes: Secondary | ICD-10-CM

## 2019-05-15 DIAGNOSIS — K5289 Other specified noninfective gastroenteritis and colitis: Secondary | ICD-10-CM | POA: Diagnosis not present

## 2019-05-15 DIAGNOSIS — R948 Abnormal results of function studies of other organs and systems: Secondary | ICD-10-CM | POA: Diagnosis not present

## 2019-05-15 DIAGNOSIS — Z86010 Personal history of colonic polyps: Secondary | ICD-10-CM | POA: Diagnosis not present

## 2019-06-01 ENCOUNTER — Other Ambulatory Visit: Payer: Self-pay

## 2019-06-01 ENCOUNTER — Encounter (HOSPITAL_BASED_OUTPATIENT_CLINIC_OR_DEPARTMENT_OTHER): Payer: Self-pay

## 2019-06-02 ENCOUNTER — Other Ambulatory Visit (HOSPITAL_COMMUNITY): Payer: Medicare Other

## 2019-06-05 ENCOUNTER — Other Ambulatory Visit (HOSPITAL_COMMUNITY)
Admission: RE | Admit: 2019-06-05 | Discharge: 2019-06-05 | Disposition: A | Discharge status: Home / Self Care | Payer: Medicare Other | Source: Ambulatory Visit | Attending: General Surgery | Admitting: General Surgery

## 2019-06-05 DIAGNOSIS — Z01812 Encounter for preprocedural laboratory examination: Secondary | ICD-10-CM | POA: Diagnosis not present

## 2019-06-05 DIAGNOSIS — Z20828 Contact with and (suspected) exposure to other viral communicable diseases: Secondary | ICD-10-CM | POA: Diagnosis not present

## 2019-06-05 LAB — SARS CORONAVIRUS 2 (TAT 6-24 HRS): SARS Coronavirus 2: NEGATIVE

## 2019-06-07 ENCOUNTER — Ambulatory Visit
Admission: RE | Admit: 2019-06-07 | Discharge: 2019-06-07 | Disposition: A | Discharge status: Home / Self Care | Payer: Medicare Other | Source: Ambulatory Visit | Attending: General Surgery | Admitting: General Surgery

## 2019-06-07 ENCOUNTER — Other Ambulatory Visit: Payer: Self-pay | Admitting: General Surgery

## 2019-06-07 ENCOUNTER — Other Ambulatory Visit: Payer: Self-pay

## 2019-06-07 DIAGNOSIS — R590 Localized enlarged lymph nodes: Secondary | ICD-10-CM

## 2019-06-08 ENCOUNTER — Encounter (HOSPITAL_BASED_OUTPATIENT_CLINIC_OR_DEPARTMENT_OTHER)
Admission: RE | Disposition: A | Discharge status: Home / Self Care | Payer: Self-pay | Source: Home / Self Care | Attending: General Surgery

## 2019-06-08 ENCOUNTER — Ambulatory Visit (HOSPITAL_BASED_OUTPATIENT_CLINIC_OR_DEPARTMENT_OTHER): Payer: Medicare Other | Admitting: Anesthesiology

## 2019-06-08 ENCOUNTER — Ambulatory Visit
Admission: RE | Admit: 2019-06-08 | Discharge: 2019-06-08 | Disposition: A | Discharge status: Home / Self Care | Payer: Medicare Other | Source: Ambulatory Visit | Attending: General Surgery | Admitting: General Surgery

## 2019-06-08 ENCOUNTER — Encounter (HOSPITAL_BASED_OUTPATIENT_CLINIC_OR_DEPARTMENT_OTHER): Payer: Self-pay | Admitting: General Surgery

## 2019-06-08 ENCOUNTER — Ambulatory Visit (HOSPITAL_COMMUNITY)
Admission: RE | Admit: 2019-06-08 | Discharge: 2019-06-08 | Disposition: A | Discharge status: Home / Self Care | Payer: Medicare Other | Attending: General Surgery | Admitting: General Surgery

## 2019-06-08 DIAGNOSIS — R591 Generalized enlarged lymph nodes: Secondary | ICD-10-CM | POA: Diagnosis not present

## 2019-06-08 DIAGNOSIS — Z79899 Other long term (current) drug therapy: Secondary | ICD-10-CM | POA: Diagnosis not present

## 2019-06-08 DIAGNOSIS — R590 Localized enlarged lymph nodes: Secondary | ICD-10-CM | POA: Diagnosis not present

## 2019-06-08 DIAGNOSIS — Z9882 Breast implant status: Secondary | ICD-10-CM | POA: Insufficient documentation

## 2019-06-08 DIAGNOSIS — E559 Vitamin D deficiency, unspecified: Secondary | ICD-10-CM | POA: Diagnosis not present

## 2019-06-08 DIAGNOSIS — R599 Enlarged lymph nodes, unspecified: Secondary | ICD-10-CM | POA: Diagnosis not present

## 2019-06-08 DIAGNOSIS — G8918 Other acute postprocedural pain: Secondary | ICD-10-CM | POA: Diagnosis not present

## 2019-06-08 DIAGNOSIS — Z86000 Personal history of in-situ neoplasm of breast: Secondary | ICD-10-CM | POA: Insufficient documentation

## 2019-06-08 DIAGNOSIS — K219 Gastro-esophageal reflux disease without esophagitis: Secondary | ICD-10-CM | POA: Diagnosis not present

## 2019-06-08 DIAGNOSIS — Z9011 Acquired absence of right breast and nipple: Secondary | ICD-10-CM | POA: Insufficient documentation

## 2019-06-08 DIAGNOSIS — Z853 Personal history of malignant neoplasm of breast: Secondary | ICD-10-CM | POA: Diagnosis not present

## 2019-06-08 HISTORY — PX: RADIOACTIVE SEED GUIDED EXCISIONAL BREAST BIOPSY: SHX6490

## 2019-06-08 HISTORY — DX: Gastro-esophageal reflux disease without esophagitis: K21.9

## 2019-06-08 SURGERY — RADIOACTIVE SEED GUIDED BREAST BIOPSY
Anesthesia: General | Site: Breast | Laterality: Right

## 2019-06-08 MED ORDER — TRAMADOL HCL 50 MG PO TABS
ORAL_TABLET | ORAL | Status: AC
  Filled 2019-06-08: qty 1

## 2019-06-08 MED ORDER — LIDOCAINE HCL 1 % IJ SOLN
INTRAMUSCULAR | Status: DC | PRN
  Administered 2019-06-08: 100 mg via INTRADERMAL

## 2019-06-08 MED ORDER — GABAPENTIN 400 MG PO CAPS: 400.0000 mg | ORAL_CAPSULE | Freq: Once | ORAL | Status: DC

## 2019-06-08 MED ORDER — FENTANYL CITRATE (PF) 100 MCG/2ML IJ SOLN
INTRAMUSCULAR | Status: AC
  Filled 2019-06-08: qty 2

## 2019-06-08 MED ORDER — GABAPENTIN 100 MG PO CAPS
ORAL_CAPSULE | ORAL | Status: AC
  Filled 2019-06-08: qty 1

## 2019-06-08 MED ORDER — FENTANYL CITRATE (PF) 100 MCG/2ML IJ SOLN
50.0000 ug | INTRAMUSCULAR | Status: DC | PRN
  Administered 2019-06-08 (×2): 50 ug via INTRAVENOUS

## 2019-06-08 MED ORDER — KETOROLAC TROMETHAMINE 15 MG/ML IJ SOLN
15.0000 mg | Freq: Once | INTRAMUSCULAR | Status: AC
  Administered 2019-06-08: 15 mg via INTRAVENOUS

## 2019-06-08 MED ORDER — LACTATED RINGERS IV SOLN
INTRAVENOUS | Status: DC
  Administered 2019-06-08: 10:00:00 via INTRAVENOUS

## 2019-06-08 MED ORDER — BUPIVACAINE HCL (PF) 0.25 % IJ SOLN
INTRAMUSCULAR | Status: DC | PRN
  Administered 2019-06-08: 3 mL

## 2019-06-08 MED ORDER — BUPIVACAINE-EPINEPHRINE (PF) 0.5% -1:200000 IJ SOLN
INTRAMUSCULAR | Status: DC | PRN
  Administered 2019-06-08 (×6): 5 mL

## 2019-06-08 MED ORDER — ONDANSETRON HCL 4 MG/2ML IJ SOLN
INTRAMUSCULAR | Status: AC
  Filled 2019-06-08: qty 2

## 2019-06-08 MED ORDER — GABAPENTIN 100 MG PO CAPS
100.0000 mg | ORAL_CAPSULE | ORAL | Status: AC
  Administered 2019-06-08: 09:00:00 100 mg via ORAL

## 2019-06-08 MED ORDER — MIDAZOLAM HCL 2 MG/2ML IJ SOLN
INTRAMUSCULAR | Status: AC
  Filled 2019-06-08: qty 2

## 2019-06-08 MED ORDER — CEFAZOLIN SODIUM-DEXTROSE 2-4 GM/100ML-% IV SOLN: 2.0000 g | INTRAVENOUS | Status: DC

## 2019-06-08 MED ORDER — ENSURE PRE-SURGERY PO LIQD: 296.0000 mL | Freq: Once | ORAL | Status: DC

## 2019-06-08 MED ORDER — CEFAZOLIN SODIUM-DEXTROSE 2-3 GM-%(50ML) IV SOLR
INTRAVENOUS | Status: DC | PRN
  Administered 2019-06-08: 2 g via INTRAVENOUS

## 2019-06-08 MED ORDER — DEXAMETHASONE SODIUM PHOSPHATE 10 MG/ML IJ SOLN
INTRAMUSCULAR | Status: DC | PRN
  Administered 2019-06-08: 10 mg via INTRAVENOUS

## 2019-06-08 MED ORDER — SODIUM CHLORIDE (PF) 0.9 % IJ SOLN
INTRAMUSCULAR | Status: AC
  Filled 2019-06-08: qty 10

## 2019-06-08 MED ORDER — PROPOFOL 10 MG/ML IV BOLUS
INTRAVENOUS | Status: DC | PRN
  Administered 2019-06-08: 150 mg via INTRAVENOUS
  Administered 2019-06-08: 50 mg via INTRAVENOUS

## 2019-06-08 MED ORDER — ACETAMINOPHEN 500 MG PO TABS
ORAL_TABLET | ORAL | Status: AC
  Filled 2019-06-08: qty 2

## 2019-06-08 MED ORDER — HYDROMORPHONE HCL 1 MG/ML IJ SOLN: 0.2500 mg | INTRAMUSCULAR | Status: DC | PRN

## 2019-06-08 MED ORDER — ONDANSETRON HCL 4 MG/2ML IJ SOLN
INTRAMUSCULAR | Status: DC | PRN
  Administered 2019-06-08: 4 mg via INTRAVENOUS

## 2019-06-08 MED ORDER — BUPIVACAINE HCL (PF) 0.25 % IJ SOLN
INTRAMUSCULAR | Status: AC
  Filled 2019-06-08: qty 30

## 2019-06-08 MED ORDER — CEFAZOLIN SODIUM-DEXTROSE 2-4 GM/100ML-% IV SOLN
INTRAVENOUS | Status: AC
  Filled 2019-06-08: qty 100

## 2019-06-08 MED ORDER — PROMETHAZINE HCL 25 MG/ML IJ SOLN: 6.2500 mg | INTRAMUSCULAR | Status: DC | PRN

## 2019-06-08 MED ORDER — TRAMADOL HCL 50 MG PO TABS
50.0000 mg | ORAL_TABLET | Freq: Once | ORAL | Status: AC
  Administered 2019-06-08: 50 mg via ORAL

## 2019-06-08 MED ORDER — PROMETHAZINE HCL 25 MG/ML IJ SOLN
INTRAMUSCULAR | Status: AC
  Filled 2019-06-08: qty 1

## 2019-06-08 MED ORDER — SUCCINYLCHOLINE CHLORIDE 200 MG/10ML IV SOSY
PREFILLED_SYRINGE | INTRAVENOUS | Status: DC | PRN
  Administered 2019-06-08: 120 mg via INTRAVENOUS

## 2019-06-08 MED ORDER — MIDAZOLAM HCL 2 MG/2ML IJ SOLN
1.0000 mg | INTRAMUSCULAR | Status: DC | PRN
  Administered 2019-06-08: 1 mg via INTRAVENOUS

## 2019-06-08 MED ORDER — MEPERIDINE HCL 25 MG/ML IJ SOLN: 6.2500 mg | INTRAMUSCULAR | Status: DC | PRN

## 2019-06-08 MED ORDER — PHENYLEPHRINE 40 MCG/ML (10ML) SYRINGE FOR IV PUSH (FOR BLOOD PRESSURE SUPPORT)
PREFILLED_SYRINGE | INTRAVENOUS | Status: DC | PRN
  Administered 2019-06-08 (×2): 80 ug via INTRAVENOUS

## 2019-06-08 MED ORDER — ONDANSETRON HCL 4 MG PO TABS: 4.0000 mg | ORAL_TABLET | Freq: Three times a day (TID) | ORAL | 0 refills | Status: DC | PRN

## 2019-06-08 MED ORDER — METHYLENE BLUE 0.5 % INJ SOLN
INTRAVENOUS | Status: AC
  Filled 2019-06-08: qty 10

## 2019-06-08 MED ORDER — KETOROLAC TROMETHAMINE 30 MG/ML IJ SOLN
INTRAMUSCULAR | Status: AC
  Filled 2019-06-08: qty 1

## 2019-06-08 MED ORDER — TRAMADOL HCL 50 MG PO TABS: 50.0000 mg | ORAL_TABLET | Freq: Four times a day (QID) | ORAL | 0 refills | Status: DC | PRN

## 2019-06-08 MED ORDER — ROCURONIUM BROMIDE 10 MG/ML (PF) SYRINGE
PREFILLED_SYRINGE | INTRAVENOUS | Status: DC | PRN
  Administered 2019-06-08: 10 mg via INTRAVENOUS

## 2019-06-08 MED ORDER — ACETAMINOPHEN 10 MG/ML IV SOLN: 1000.0000 mg | Freq: Once | INTRAVENOUS | Status: DC | PRN

## 2019-06-08 MED ORDER — ACETAMINOPHEN 500 MG PO TABS
1000.0000 mg | ORAL_TABLET | ORAL | Status: AC
  Administered 2019-06-08: 09:00:00 1000 mg via ORAL

## 2019-06-08 MED ORDER — PROMETHAZINE HCL 25 MG/ML IJ SOLN
6.2500 mg | Freq: Four times a day (QID) | INTRAMUSCULAR | Status: DC | PRN
  Administered 2019-06-08: 13:00:00 6.25 mg via INTRAVENOUS

## 2019-06-08 SURGICAL SUPPLY — 56 items
APPLIER CLIP 9.375 MED OPEN (MISCELLANEOUS)
BINDER BREAST LRG (GAUZE/BANDAGES/DRESSINGS) IMPLANT
BINDER BREAST MEDIUM (GAUZE/BANDAGES/DRESSINGS) IMPLANT
BINDER BREAST XLRG (GAUZE/BANDAGES/DRESSINGS) IMPLANT
BINDER BREAST XXLRG (GAUZE/BANDAGES/DRESSINGS) IMPLANT
BLADE SURG 15 STRL LF DISP TIS (BLADE) ×1 IMPLANT
BLADE SURG 15 STRL SS (BLADE) ×2
CANISTER SUC SOCK COL 7IN (MISCELLANEOUS) IMPLANT
CANISTER SUCT 1200ML W/VALVE (MISCELLANEOUS) IMPLANT
CHLORAPREP W/TINT 26 (MISCELLANEOUS) ×3 IMPLANT
CLIP APPLIE 9.375 MED OPEN (MISCELLANEOUS) IMPLANT
CLIP VESOCCLUDE SM WIDE 6/CT (CLIP) IMPLANT
CLOSURE WOUND 1/2 X4 (GAUZE/BANDAGES/DRESSINGS) ×1
COVER BACK TABLE REUSABLE LG (DRAPES) ×3 IMPLANT
COVER MAYO STAND REUSABLE (DRAPES) ×3 IMPLANT
COVER PROBE W GEL 5X96 (DRAPES) ×3 IMPLANT
COVER WAND RF STERILE (DRAPES) IMPLANT
DECANTER SPIKE VIAL GLASS SM (MISCELLANEOUS) IMPLANT
DERMABOND ADVANCED (GAUZE/BANDAGES/DRESSINGS) ×2
DERMABOND ADVANCED .7 DNX12 (GAUZE/BANDAGES/DRESSINGS) ×1 IMPLANT
DRAPE LAPAROSCOPIC ABDOMINAL (DRAPES) ×3 IMPLANT
DRAPE UTILITY XL STRL (DRAPES) ×3 IMPLANT
DRSG TEGADERM 4X4.75 (GAUZE/BANDAGES/DRESSINGS) IMPLANT
ELECT COATED BLADE 2.86 ST (ELECTRODE) ×3 IMPLANT
ELECT REM PT RETURN 9FT ADLT (ELECTROSURGICAL) ×3
ELECTRODE REM PT RTRN 9FT ADLT (ELECTROSURGICAL) ×1 IMPLANT
GAUZE SPONGE 4X4 12PLY STRL LF (GAUZE/BANDAGES/DRESSINGS) IMPLANT
GLOVE BIO SURGEON STRL SZ7 (GLOVE) ×6 IMPLANT
GLOVE BIOGEL PI IND STRL 7.5 (GLOVE) ×1 IMPLANT
GLOVE BIOGEL PI INDICATOR 7.5 (GLOVE) ×2
GOWN STRL REUS W/ TWL LRG LVL3 (GOWN DISPOSABLE) ×2 IMPLANT
GOWN STRL REUS W/TWL LRG LVL3 (GOWN DISPOSABLE) ×4
HEMOSTAT ARISTA ABSORB 3G PWDR (HEMOSTASIS) IMPLANT
ILLUMINATOR WAVEGUIDE N/F (MISCELLANEOUS) IMPLANT
KIT MARKER MARGIN INK (KITS) ×3 IMPLANT
LIGHT WAVEGUIDE WIDE FLAT (MISCELLANEOUS) IMPLANT
NEEDLE HYPO 25X1 1.5 SAFETY (NEEDLE) ×3 IMPLANT
NS IRRIG 1000ML POUR BTL (IV SOLUTION) IMPLANT
PACK BASIN DAY SURGERY FS (CUSTOM PROCEDURE TRAY) ×3 IMPLANT
PENCIL SMOKE EVACUATOR (MISCELLANEOUS) ×3 IMPLANT
SLEEVE SCD COMPRESS KNEE MED (MISCELLANEOUS) ×3 IMPLANT
SPONGE LAP 4X18 RFD (DISPOSABLE) ×3 IMPLANT
STRIP CLOSURE SKIN 1/2X4 (GAUZE/BANDAGES/DRESSINGS) ×2 IMPLANT
SUT MNCRL AB 4-0 PS2 18 (SUTURE) IMPLANT
SUT MON AB 5-0 PS2 18 (SUTURE) IMPLANT
SUT SILK 2 0 SH (SUTURE) IMPLANT
SUT VIC AB 2-0 SH 27 (SUTURE) ×2
SUT VIC AB 2-0 SH 27XBRD (SUTURE) ×1 IMPLANT
SUT VIC AB 3-0 SH 27 (SUTURE) ×2
SUT VIC AB 3-0 SH 27X BRD (SUTURE) ×1 IMPLANT
SYR CONTROL 10ML LL (SYRINGE) ×3 IMPLANT
TOWEL GREEN STERILE FF (TOWEL DISPOSABLE) ×3 IMPLANT
TRAY FAXITRON CT DISP (TRAY / TRAY PROCEDURE) ×3 IMPLANT
TUBE CONNECTING 20'X1/4 (TUBING)
TUBE CONNECTING 20X1/4 (TUBING) IMPLANT
YANKAUER SUCT BULB TIP NO VENT (SUCTIONS) IMPLANT

## 2019-06-08 NOTE — Anesthesia Procedure Notes (Signed)
Procedure Name: Intubation Date/Time: 06/08/2019 10:59 AM Performed by: Hewitt Blade, CRNA Pre-anesthesia Checklist: Patient identified, Emergency Drugs available, Suction available and Patient being monitored Patient Re-evaluated:Patient Re-evaluated prior to induction Oxygen Delivery Method: Circle system utilized Preoxygenation: Pre-oxygenation with 100% oxygen Induction Type: IV induction Ventilation: Mask ventilation without difficulty Laryngoscope Size: Glidescope and 4 Grade View: Grade I Tube type: Oral Tube size: 7.0 mm Number of attempts: 1 Airway Equipment and Method: Oral airway,  Video-laryngoscopy and Rigid stylet Placement Confirmation: ETT inserted through vocal cords under direct vision,  positive ETCO2 and breath sounds checked- equal and bilateral Secured at: 21 cm Tube secured with: Tape Dental Injury: Teeth and Oropharynx as per pre-operative assessment  Difficulty Due To: Difficulty was unanticipated and Difficult Airway- due to anterior larynx Future Recommendations: Recommend- induction with short-acting agent, and alternative techniques readily available Comments: DL x2 with MAC 4; grade 4 view. EZ mask with OA. DL x1 Glidescope. Glottic opening easily visualized. ETT pass with ease. +ETCO2, =BBS.

## 2019-06-08 NOTE — Anesthesia Preprocedure Evaluation (Signed)
Anesthesia Evaluation  Patient identified by MRN, date of birth, ID band Patient awake    Reviewed: Allergy & Precautions, NPO status , Patient's Chart, lab work & pertinent test results  Airway Mallampati: I       Dental no notable dental hx. (+) Teeth Intact   Pulmonary neg pulmonary ROS,    Pulmonary exam normal breath sounds clear to auscultation       Cardiovascular negative cardio ROS Normal cardiovascular exam Rhythm:Regular Rate:Normal     Neuro/Psych negative neurological ROS  negative psych ROS   GI/Hepatic Neg liver ROS, PUD, GERD  Medicated,  Endo/Other  negative endocrine ROS  Renal/GU negative Renal ROS  negative genitourinary   Musculoskeletal   Abdominal Normal abdominal exam  (+)   Peds  Hematology negative hematology ROS (+)   Anesthesia Other Findings   Reproductive/Obstetrics                             Anesthesia Physical Anesthesia Plan  ASA: II  Anesthesia Plan: General   Post-op Pain Management:  Regional for Post-op pain   Induction: Intravenous  PONV Risk Score and Plan: 4 or greater and Ondansetron, Dexamethasone and Midazolam  Airway Management Planned: Oral ETT  Additional Equipment:   Intra-op Plan:   Post-operative Plan: Extubation in OR  Informed Consent: I have reviewed the patients History and Physical, chart, labs and discussed the procedure including the risks, benefits and alternatives for the proposed anesthesia with the patient or authorized representative who has indicated his/her understanding and acceptance.     Dental advisory given  Plan Discussed with: CRNA  Anesthesia Plan Comments:         Anesthesia Quick Evaluation

## 2019-06-08 NOTE — Brief Op Note (Signed)
06/08/2019  11:25 AM  PATIENT:  Megan Mahoney  73 y.o. female  PRE-OPERATIVE DIAGNOSIS:  RIGHT AXILLARY LYMPHADENOPATHY  POST-OPERATIVE DIAGNOSIS:  RIGHT AXILLARY LYMPHADENOPATHY  PROCEDURE:  Procedure(s): RIGHT AXILLARY NODE RADIOACTIVE SEED GUIDED EXCISION (Right)  SURGEON:  Surgeon(s) and Role:    * Rolm Bookbinder, MD - Primary  PHYSICIAN ASSISTANT:   ASSISTANTS: none   ANESTHESIA:   general  With pec block EBL:  10 mL   BLOOD ADMINISTERED:none  DRAINS: none   LOCAL MEDICATIONS USED:  MARCAINE     SPECIMEN:  Excision  DISPOSITION OF SPECIMEN:  PATHOLOGY  COUNTS:  YES  TOURNIQUET:  * No tourniquets in log *  DICTATION: .Dragon Dictation  PLAN OF CARE: Discharge to home after PACU  PATIENT DISPOSITION:  PACU - hemodynamically stable.   Delay start of Pharmacological VTE agent (>24hrs) due to surgical blood loss or risk of bleeding: not applicable

## 2019-06-08 NOTE — Discharge Instructions (Signed)
[place] Office Phone Number [phone]  POST OP INSTRUCTIONS Take 400 mg of ibuprofen every 8 hours or 650 mg tylenol every 6 hours for next 72 hours then as needed. Use ice several times daily also. Always review your discharge instruction sheet given to you by the facility where your surgery was performed.  IF YOU HAVE DISABILITY OR FAMILY LEAVE FORMS, YOU MUST BRING THEM TO THE OFFICE FOR PROCESSING.  DO NOT GIVE THEM TO YOUR DOCTOR.  1. A prescription for pain medication may be given to you upon discharge.  Take your pain medication as prescribed, if needed.  If narcotic pain medicine is not needed, then you may take acetaminophen (Tylenol), naprosyn (Alleve) or ibuprofen (Advil) as needed. 2. Take your usually prescribed medications unless otherwise directed 3. If you need a refill on your pain medication, please contact your pharmacy.  They will contact our office to request authorization.  Prescriptions will not be filled after 5pm or on week-ends. 4. You should eat very light the first 24 hours after surgery, such as soup, crackers, pudding, etc.  Resume your normal diet the day after surgery. 5. Most patients will experience some swelling and bruising in the breast.  Ice packs and a good support bra will help.  Wear the breast binder provided or a sports bra for 72 hours day and night.  After that wear a sports bra during the day until you return to the office. Swelling and bruising can take several days to resolve.  6. It is common to experience some constipation if taking pain medication after surgery.  Increasing fluid intake and taking a stool softener will usually help or prevent this problem from occurring.  A mild laxative (Milk of Magnesia or Miralax) should be taken according to package directions if there are no bowel movements after 48 hours. 7. Unless discharge instructions indicate otherwise, you may remove your bandages 48 hours after surgery and you may  shower at that time.  You may have steri-strips (small skin tapes) in place directly over the incision.  These strips should be left on the skin for 7-10 days and will come off on their own.  If your surgeon used skin glue on the incision, you may shower in 24 hours.  The glue will flake off over the next 2-3 weeks.  Any sutures or staples will be removed at the office during your follow-up visit. 8. ACTIVITIES:  You may resume regular daily activities (gradually increasing) beginning the next day.  Wearing a good support bra or sports bra minimizes pain and swelling.  You may have sexual intercourse when it is comfortable. a. You may drive when you no longer are taking prescription pain medication, you can comfortably wear a seatbelt, and you can safely maneuver your car and apply brakes. b. RETURN TO WORK:  ______________________________________________________________________________________ 9. You should see your doctor in the office for a follow-up appointment approximately two weeks after your surgery.  Your doctors nurse will typically make your follow-up appointment when she calls you with your pathology report.  Expect your pathology report 3-4 business days after your surgery.  You may call to check if you do not hear from [place] after three days. 10. OTHER INSTRUCTIONS: _______________________________________________________________________________________________ _____________________________________________________________________________________________________________________________________ _____________________________________________________________________________________________________________________________________ _____________________________________________________________________________________________________________________________________  WHEN TO CALL DR WAKEFIELD: 1. Fever over 101.0 2. Nausea and/or vomiting. 3. Extreme swelling or bruising. 4. Continued bleeding from  incision. 5. Increased pain, redness, or drainage from the incision.  The clinic staff is available to  answer your questions during regular business hours.  Please dont hesitate to call and ask to speak to one of the nurses for clinical concerns.  If you have a medical emergency, go to the nearest emergency room or call 911.  A surgeon from [facility] is always on call at the hospital.  For further questions, please visit [email or web address]    No tylenol until after 3:30 today No ibuprofen today until after 9pm tonight  Post Anesthesia Home Care Instructions  Activity: Get plenty of rest for the remainder of the day. A responsible individual must stay with you for 24 hours following the procedure.  For the next 24 hours, DO NOT: -Drive a car -Paediatric nurse -Drink alcoholic beverages -Take any medication unless instructed by your physician -Make any legal decisions or sign important papers.  Meals: Start with liquid foods such as gelatin or soup. Progress to regular foods as tolerated. Avoid greasy, spicy, heavy foods. If nausea and/or vomiting occur, drink only clear liquids until the nausea and/or vomiting subsides. Call your physician if vomiting continues.  Special Instructions/Symptoms: Your throat may feel dry or sore from the anesthesia or the breathing tube placed in your throat during surgery. If this causes discomfort, gargle with warm salt water. The discomfort should disappear within 24 hours.  If you had a scopolamine patch placed behind your ear for the management of post- operative nausea and/or vomiting:  1. The medication in the patch is effective for 72 hours, after which it should be removed.  Wrap patch in a tissue and discard in the trash. Wash hands thoroughly with soap and water. 2. You may remove the patch earlier than 72 hours if you experience unpleasant side effects which may include dry mouth, dizziness or visual  disturbances. 3. Avoid touching the patch. Wash your hands with soap and water after contact with the patch.

## 2019-06-08 NOTE — Anesthesia Postprocedure Evaluation (Signed)
Anesthesia Post Note  Patient: Megan Mahoney  Procedure(s) Performed: RIGHT AXILLARY NODE RADIOACTIVE SEED GUIDED EXCISION (Right Breast)     Patient location during evaluation: Phase II Anesthesia Type: General Level of consciousness: awake and sedated Pain management: pain level controlled Vital Signs Assessment: post-procedure vital signs reviewed and stable Respiratory status: spontaneous breathing Cardiovascular status: stable Postop Assessment: adequate PO intake Anesthetic complications: yes Anesthetic complication details: PONV   Last Vitals:  Vitals:   06/08/19 1345 06/08/19 1400  BP: 125/76 114/72  Pulse: 65 64  Resp: (!) 9 14  Temp:    SpO2: 98% 97%    Last Pain:  Vitals:   06/08/19 1300  TempSrc:   PainSc: 5    Pain Goal: Patients Stated Pain Goal: 3 (06/08/19 1300)                 Huston Foley

## 2019-06-08 NOTE — Anesthesia Procedure Notes (Signed)
Anesthesia Regional Block: Pectoralis block   Pre-Anesthetic Checklist: ,, timeout performed, Correct Patient, Correct Site, Correct Laterality, Correct Procedure, Correct Position, site marked, Risks and benefits discussed,  Surgical consent,  Pre-op evaluation,  At surgeon's request and post-op pain management  Laterality: Right  Prep: chloraprep       Needles:  Injection technique: Single-shot  Needle Type: Echogenic Stimulator Needle     Needle Length: 10cm  Needle Gauge: 21   Needle insertion depth: 1 cm   Additional Needles:   Procedures:,,,, ultrasound used (permanent image in chart),,,,  Narrative:  Start time: 06/08/2019 10:05 AM End time: 06/08/2019 10:15 AM Injection made incrementally with aspirations every 5 mL.  Performed by: Personally  Anesthesiologist: Lyn Hollingshead, MD

## 2019-06-08 NOTE — Op Note (Signed)
Preoperative diagnosis: prior history of right breast cancer, right axillary lymphadenopathy Postoperative diagnosis: same as above Procedure: Right axillary seed guided node excision Surgeon: Dr Serita Grammes Anesthesia: general with pectoral block EBL: minimal Specimens: right axillary nodes containing seed and old clips Drains none Complications none Sponge and needle count correct times two dispo recovery stable  Indications: 17 yof with history of right breast dcis treated at [facility] in 1997 with right mastectomy. this was discovered after having bloody nipple dc. she has implant reconstruction. she has done well since then. she has no mass or dc now. she had some urinary complaints and underwent a ct scan per urology. this noted a possible lung mass. she then had dedicated chest ct that showed pulm nodule. PET scan showed focal activity in left occiptial lobe (now MR negative). there is hypermetabolic right axillary node, likely benign lung nodules. she has negative mm in june on the left. US of the right axilla shows a right ax node with fct of 6 mm. biopsy shows a benign node but this is thought to be discordant. we discussed excision with seed guidance.  Procedure: After informed consent obtained she underwent a pectoral block. She was given antibiotics and SCDs were in place.  She was placed under general anesthesia. She had anterior airway requiring glidescope intubation. She was prepped and draped in standard sterile surgical fashion. Surgical timeout performed.    I identified the radioactive seed in the right axilla. I infiltrated marcaine and made a curvilinear incision. I carried this through the axillary fascia. There were multiple old clips present. I then removed the seed and what appear to be a couple abnormal nodes.  Mammogram confirmed removal of seed.  Hemostasis was observed. I closed with 2-0 vicryl, 3-0 vicryl and 4-0 monocryl. Glue and steristrips were applied.  She  tolerated well and was transferred to recovery stable.

## 2019-06-08 NOTE — Interval H&P Note (Signed)
History and Physical Interval Note:  06/08/2019 10:24 AM  Megan Mahoney  has presented today for surgery, with the diagnosis of RIGHT AXILLARY LYMPHADENOPATHY.  The various methods of treatment have been discussed with the patient and family. After consideration of risks, benefits and other options for treatment, the patient has consented to  Procedure(s): [place] (Right) as a surgical intervention.  The patient's history has been reviewed, patient examined, no change in status, stable for surgery.  I have reviewed the patient's chart and labs.  Questions were answered to the patient's satisfaction.     Rolm Bookbinder

## 2019-06-08 NOTE — Transfer of Care (Signed)
Immediate Anesthesia Transfer of Care Note  Patient: Megan Mahoney  Procedure(s) Performed: RIGHT AXILLARY NODE RADIOACTIVE SEED GUIDED EXCISION (Right Breast)  Patient Location: PACU  Anesthesia Type:General  Level of Consciousness: sedated  Airway & Oxygen Therapy: Patient Spontanous Breathing and Patient connected to nasal cannula oxygen  Post-op Assessment: Report given to RN and Post -op Vital signs reviewed and stable  Post vital signs: Reviewed and stable  Last Vitals:  Vitals Value Taken Time  BP 134/80 06/08/19 1134  Temp    Pulse 84 06/08/19 1139  Resp 14 06/08/19 1139  SpO2 98 % 06/08/19 1139  Vitals shown include unvalidated device data.  Last Pain:  Vitals:   06/08/19 0912  TempSrc: Temporal  PainSc: 1          Complications: No apparent anesthesia complications

## 2019-06-08 NOTE — Progress Notes (Signed)
Assisted Dr. Jillyn Hidden with right, ultrasound guided, pectoralis block. Side rails up, monitors on throughout procedure. See vital signs in flow sheet. Tolerated Procedure well.

## 2019-06-08 NOTE — H&P (Signed)
73 yof referred by Dr Purcell Nails for right axillary lymphadenopathy. she has history of right breast dcis treated at [facility] in 1997 with right mastectomy. this was discovered after having bloody nipple dc. she has implant reconstruction. she has done well since then. she has no mass or dc now. she had some urinary complaints and underwent a ct scan per urology. this noted a possible lung mass. she then had dedicated chest ct that showed pulm nodule. PET scan showed focal activity in left occiptial lobe (now MR negative). there is hypermetabolic right axillary node, likely benign lung nodules. she has negative mm in june on the left. US of the right axilla shows a right ax node with fct of 6 mm. biopsy shows a benign node but this is thought to be discordant. she is referred for consideration of excision   Past Surgical History  Breast Biopsy  Left. Breast Reconstruction  Right. Colon Polyp Removal - Colonoscopy  Hysterectomy (not due to cancer) - Complete   Diagnostic Studies History  Colonoscopy  1-5 years ago Mammogram  1-3 years ago Pap Smear  1-5 years ago  Allergies Codeine/Codeine Derivatives  Allergies Reconciled   Medication History  Mesalamine (1.2GM Tablet DR, Oral) Active. Clobetasol Propionate (0.05% Ointment, External) Active. Trimethoprim (100MG  Tablet, Oral) Active. Risedronate Sodium (150MG  Tablet, Oral) Active. Vitamin D3 (50 MCG(2000 UT) Capsule, Oral) Active. Senior Multivitamin Plus (Oral) Active. Beano (Oral) Active. Claritin (10MG  Tablet, Oral) Active. Melatonin (1MG  Tablet, Oral) Active. Medications Reconciled  Social History Alcohol use  Remotely quit alcohol use. Caffeine use  Tea. No drug use  Tobacco use  Never smoker.  Family History  Arthritis  Family Members In General, Mother. Breast Cancer  Family Members In General. Colon Cancer  Family Members In General. Ovarian Cancer  Family Members In General. Prostate  Cancer  Father. Thyroid problems  Mother.  Pregnancy / Birth History Age at menarche  59 years. Age of menopause  <47 Contraceptive History  Intrauterine device. Gravida  2 Length (months) of breastfeeding  3-6 Maternal age  68-25 Para  2  Other Problems  Back Pain  Bladder Problems  Breast Cancer  Heart murmur  Inguinal Hernia  Lump In Breast  Melanoma  Transfusion history    Review of Systems  HEENT Present- Wears glasses/contact lenses. Not Present- Earache, Hearing Loss, Hoarseness, Nose Bleed, Oral Ulcers, Ringing in the Ears, Seasonal Allergies, Sinus Pain, Sore Throat, Visual Disturbances and Yellow Eyes. Respiratory Not Present- Bloody sputum, Chronic Cough, Difficulty Breathing, Snoring and Wheezing. Breast Present- Breast Mass. Not Present- Breast Pain, Nipple Discharge and Skin Changes. Cardiovascular Present- Swelling of Extremities. Not Present- Chest Pain, Difficulty Breathing Lying Down, Leg Cramps, Palpitations, Rapid Heart Rate and Shortness of Breath. Gastrointestinal Present- Hemorrhoids. Not Present- Abdominal Pain, Bloating, Bloody Stool, Change in Bowel Habits, Chronic diarrhea, Constipation, Difficulty Swallowing, Excessive gas, Gets full quickly at meals, Indigestion, Nausea, Rectal Pain and Vomiting. Female Genitourinary Not Present- Frequency, Nocturia, Painful Urination, Pelvic Pain and Urgency. Musculoskeletal Not Present- Back Pain, Joint Pain, Joint Stiffness, Muscle Pain, Muscle Weakness and Swelling of Extremities. Neurological Not Present- Decreased Memory, Fainting, Headaches, Numbness, Seizures, Tingling, Tremor, Trouble walking and Weakness. Psychiatric Not Present- Anxiety, Bipolar, Change in Sleep Pattern, Depression, Fearful and Frequent crying. Endocrine Not Present- Cold Intolerance, Excessive Hunger, Hair Changes, Heat Intolerance, Hot flashes and New Diabetes.   Physical Exam General Mental  Status-Alert. Orientation-Oriented X3. Breast Nipples Discharge - Left - None - Left. Breast Lump-No Palpable Breast Mass. Note:  s/p right mastectomy with healed incision and implant Lymphatic Head & Neck General Head & Neck Lymphatics: Bilateral - Description - Normal. Axillary General Axillary Region: Bilateral - Description - Normal. Note: no Shaw adenopathy   Assessment & Plan AXILLARY LYMPHADENOPATHY (R59.0) Story: Right breast axillary node seed guided excision -we discussed seed guided excision of this node. risks, recovery all discussed.

## 2019-06-09 ENCOUNTER — Encounter: Payer: Self-pay | Admitting: *Deleted

## 2019-06-09 LAB — SURGICAL PATHOLOGY

## 2019-06-09 NOTE — Addendum Note (Signed)
Addendum  created 06/09/19 1116 by Tawni Millers, CRNA   Charge Capture section accepted

## 2019-06-12 ENCOUNTER — Other Ambulatory Visit: Payer: Medicare Other

## 2019-06-15 ENCOUNTER — Ambulatory Visit: Payer: Medicare Other | Admitting: Endocrinology

## 2019-07-19 ENCOUNTER — Other Ambulatory Visit: Payer: Medicare Other

## 2019-07-19 ENCOUNTER — Other Ambulatory Visit: Payer: Self-pay

## 2019-07-19 ENCOUNTER — Other Ambulatory Visit (INDEPENDENT_AMBULATORY_CARE_PROVIDER_SITE_OTHER): Payer: Medicare Other

## 2019-07-19 DIAGNOSIS — M810 Age-related osteoporosis without current pathological fracture: Secondary | ICD-10-CM | POA: Diagnosis not present

## 2019-07-19 DIAGNOSIS — K5289 Other specified noninfective gastroenteritis and colitis: Secondary | ICD-10-CM | POA: Diagnosis not present

## 2019-07-19 DIAGNOSIS — Z0000 Encounter for general adult medical examination without abnormal findings: Secondary | ICD-10-CM | POA: Diagnosis not present

## 2019-07-19 DIAGNOSIS — E7800 Pure hypercholesterolemia, unspecified: Secondary | ICD-10-CM | POA: Diagnosis not present

## 2019-07-19 LAB — COMPREHENSIVE METABOLIC PANEL
ALT: 19 U/L (ref 0–35)
AST: 20 U/L (ref 0–37)
Albumin: 4.4 g/dL (ref 3.5–5.2)
Alkaline Phosphatase: 69 U/L (ref 39–117)
BUN: 17 mg/dL (ref 6–23)
CO2: 29 mEq/L (ref 19–32)
Calcium: 9.5 mg/dL (ref 8.4–10.5)
Chloride: 104 mEq/L (ref 96–112)
Creatinine, Ser: 1.03 mg/dL (ref 0.40–1.20)
GFR: 52.75 mL/min — ABNORMAL LOW (ref >60.00)
Glucose, Bld: 108 mg/dL — ABNORMAL HIGH (ref 70–99)
Potassium: 4.4 mEq/L (ref 3.5–5.1)
Sodium: 140 mEq/L (ref 135–145)
Total Bilirubin: 0.4 mg/dL (ref 0.2–1.2)
Total Protein: 7.6 g/dL (ref 6.0–8.3)

## 2019-07-19 LAB — VITAMIN D 25 HYDROXY (VIT D DEFICIENCY, FRACTURES): VITD: 46.58 ng/mL (ref 30.00–100.00)

## 2019-07-24 ENCOUNTER — Ambulatory Visit (INDEPENDENT_AMBULATORY_CARE_PROVIDER_SITE_OTHER): Payer: Medicare Other | Admitting: Endocrinology

## 2019-07-24 ENCOUNTER — Other Ambulatory Visit: Payer: Self-pay

## 2019-07-24 ENCOUNTER — Telehealth: Payer: Self-pay

## 2019-07-24 ENCOUNTER — Encounter: Payer: Self-pay | Admitting: Endocrinology

## 2019-07-24 VITALS — BP 138/82 | HR 89 | Ht 65.5 in | Wt 164.0 lb

## 2019-07-24 DIAGNOSIS — M810 Age-related osteoporosis without current pathological fracture: Secondary | ICD-10-CM

## 2019-07-24 NOTE — Progress Notes (Addendum)
Patient ID: Megan Mahoney, female   DOB: Apr 08, 1946, 74 y.o.   MRN: [id number]           Referring Physician: Dr. Josefa Half    Chief complaint: Follow-up of osteoporosis  History of Present Illness:  The patient has been referred here for osteoporosis management.  She has had serial bone densities done and this showed the following T-scores:  Left Forearm Radius 33% 12/28/2018 70.9 -2.3 0.679 g/cm2 Left Forearm Radius 33% 11/18/2016 68.8 -2.7 0.652 g/cm2 Forearm in 2016 -2.3  DualFemur Neck Right 12/28/2018 70.9 -1.9 0.768 g/cm2 DualFemur Neck Right 11/18/2016 68.8 -2.2 0.734 g/cm2  DualFemur Total Mean 12/28/2018 70.9 -1.3 0.844 g/cm2 * DualFemur Total Mean 11/18/2016 68.8 -1.6 0.805 g/cm2  Conclusion from bone density in 2020: There has been a statistically significant increase in BMD of Total Mean hips since prior exam dated 11/18/2016.   Lumbar spine was not utilized due to advanced degenerative changes.  She has no history of low trauma fracture; she had fracture of her foot when she was about 74 years old She thinks she has had 2-2.5 inch height loss since her youth and was about 5 feet 8 inches previously  Age at menopause: 46 Subsequently she was given HRT until the age of 60 when she had to stop because of diagnosis of breast cancer  Osteoporosis treatment: Prolia 60 mg every 6 months since about 03/2017 until 09/2018 She was started on treatment after her T score at the radius decreased down to -2.7 compared to - 2.3 Previously was not given option for Fosamax or other bisphosphonate because of her fear of kidney problems  Now taking Actonel 150 mg every month since 03/2019  Vitamin D supplements: 2000 units daily; did have low level at baseline Not taking calcium supplements    LABS:  Lab Results  Component Value Date   VD25OH 46.58 07/19/2019   VD25OH 40.55 02/22/2019   Lab Results  Component Value Date   CALCIUM 9.5 07/19/2019     Past  Medical History:  Diagnosis Date  . Blood transfusion without reported diagnosis 1970's   multiple--following desmoid tumor surgery  . Breast cancer ([place])   . Cancer ([place]) 1997   Rt. breast ca  . GERD (gastroesophageal reflux disease)   . Heart murmur   . Heart murmur   . History of breast cancer   . Incisional hernia   . Lichen sclerosus    --vulva  . Low vitamin D level 2018  . Osteoporosis 2018  . 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 Tendonitis of ankle, left   . Ulcerative colitis [facility])     Past Surgical History:  Procedure Laterality Date  . ABDOMINAL HYSTERECTOMY  1979   TAH/BSO  . AUGMENTATION MAMMAPLASTY Right   . BREAST SURGERY  1997   Rt. mastectomy/reconstruction  . 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 MASTECTOMY Right 1997  . melanoma removal  2002   --rt. thigh  . NEPHRECTOMY  1979  . RADIOACTIVE SEED GUIDED EXCISIONAL BREAST BIOPSY Right 06/08/2019   Procedure: RIGHT AXILLARY NODE RADIOACTIVE SEED GUIDED EXCISION;  Surgeon: Rolm Bookbinder, MD;  Location: [place];  Service: General;  Laterality: Right;  . SIGMOID RESECTION / RECTOPEXY    . SQUAMOUS CELL CARCINOMA EXCISION  2019   removed right thigh   . TONSILLECTOMY AND ADENOIDECTOMY      Family History  Problem Relation Age  of Onset  . Ovarian cancer Sister 63       dec  . Cancer Sister   . Deep vein thrombosis Mother   . Cancer Father   . Hypertension Father   . Cancer Maternal Grandmother 90       colon  . Transient ischemic attack Maternal Grandmother   . Breast cancer Paternal Grandmother 16    Social History:  reports that she has never smoked. She has never used smokeless tobacco. She reports that she does not drink alcohol or use drugs.  Allergies:  Allergies  Allergen Reactions  . Codeine Nausea And Vomiting  . Morphine And Related Nausea And Vomiting    Allergies as of 07/24/2019      Reactions   Codeine Nausea And  Vomiting   Morphine And Related Nausea And Vomiting      Medication List       Accurate as of July 24, 2019  1:51 PM. If you have any questions, ask your nurse or doctor.        CLARITIN PO Take by mouth as needed.   clobetasol ointment 0.05 % Commonly known as: TEMOVATE APPLY 1 APPLICATION TOPICALLY ONCE WEEKLY.  May use twice a day for 2 weeks at a time for a flare.   famotidine 20 MG tablet Commonly known as: PEPCID Take 20 mg by mouth 2 (two) times daily.   MELATONIN PO Take by mouth as needed.   multivitamin capsule Take 1 capsule by mouth daily.   ondansetron 4 MG tablet Commonly known as: ZOFRAN Take 1 tablet (4 mg total) by mouth every 8 (eight) hours as needed for nausea.   PREVAGEN PO Take by mouth.   PROBIOTIC PO Take by mouth.   risedronate 150 MG tablet Commonly known as: ACTONEL Take 1 tablet (150 mg total) by mouth every 30 (thirty) days. with 8 oz water on empty stomach.  Start on 03/30/2019   traMADol 50 MG tablet Commonly known as: ULTRAM Take 1 tablet (50 mg total) by mouth every 6 (six) hours as needed.   trimethoprim 100 MG tablet Commonly known as: TRIMPEX Take 100 mg by mouth daily.   Tylenol 325 MG Caps Generic drug: Acetaminophen Take by mouth as needed.   Vitamin D3 25 MCG (1000 UT) Caps Take 1 capsule by mouth daily.        Review of Systems   She has less diarrhea recently Not taking any Tums for GI symptoms since she is taking Pepcid   LABS:  Appointment on 07/19/2019  Component Date Value Ref Range Status  . VITD 07/19/2019 46.58  30.00 - 100.00 ng/mL Final  . Sodium 07/19/2019 140  135 - 145 mEq/L Final  . Potassium 07/19/2019 4.4  3.5 - 5.1 mEq/L Final  . Chloride 07/19/2019 104  96 - 112 mEq/L Final  . CO2 07/19/2019 29  19 - 32 mEq/L Final  . Glucose, Bld 07/19/2019 108* 70 - 99 mg/dL Final  . BUN 07/19/2019 17  6 - 23 mg/dL Final  . Creatinine, Ser 07/19/2019 1.03  0.40 - 1.20 mg/dL Final  . Total  Bilirubin 07/19/2019 0.4  0.2 - 1.2 mg/dL Final  . Alkaline Phosphatase 07/19/2019 69  39 - 117 U/L Final  . AST 07/19/2019 20  0 - 37 U/L Final  . ALT 07/19/2019 19  0 - 35 U/L Final  . Total Protein 07/19/2019 7.6  6.0 - 8.3 g/dL Final  . Albumin 07/19/2019 4.4  3.5 - 5.2  g/dL Final  . GFR 07/19/2019 52.75* >60.00 mL/min Final  . 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

## 2019-07-24 NOTE — Telephone Encounter (Signed)
Patient called today stating she was called to change her lab appointment on 07/19/19 due to not having a lab person so she went to [place] [facility] lab as instructed but still received a letter stating she was a no-show for our lab-patient would like this corrected in her chart and a call from the office Manager letting her know this was done

## 2019-07-25 ENCOUNTER — Other Ambulatory Visit: Payer: Self-pay | Admitting: Endocrinology

## 2019-07-26 ENCOUNTER — Ambulatory Visit: Payer: Medicare Other

## 2019-08-04 ENCOUNTER — Ambulatory Visit: Payer: Medicare Other | Attending: Internal Medicine

## 2019-08-04 DIAGNOSIS — Z23 Encounter for immunization: Secondary | ICD-10-CM | POA: Insufficient documentation

## 2019-08-04 DIAGNOSIS — N3021 Other chronic cystitis with hematuria: Secondary | ICD-10-CM | POA: Diagnosis not present

## 2019-08-04 DIAGNOSIS — R350 Frequency of micturition: Secondary | ICD-10-CM | POA: Diagnosis not present

## 2019-08-04 NOTE — Progress Notes (Signed)
   Covid-19 Vaccination Clinic  Name:  Megan Mahoney    MRN: [id number] DOB: 01-01-1946  08/04/2019  Ms. Megan Mahoney was observed post Covid-19 immunization for 15 minutes without incidence. She was provided with Vaccine Information Sheet and instruction to access the V-Safe system.   Ms. Megan Mahoney was instructed to call 911 with any severe reactions post vaccine: Marland Kitchen Difficulty breathing  . Swelling of your face and throat  . A fast heartbeat  . A bad rash all over your body  . Dizziness and weakness    Immunizations Administered    Name Date Dose VIS Date Route   Pfizer COVID-19 Vaccine 08/04/2019 12:04 PM 0.3 mL 06/09/2019 Intramuscular   Manufacturer: [place]   Lot: [id number]   [place]: [id number]

## 2019-08-13 NOTE — Telephone Encounter (Signed)
Pt appt has been changed to cancelled per provider, she was never charged for a ns fee.

## 2019-08-16 ENCOUNTER — Ambulatory Visit: Payer: Medicare Other

## 2019-08-28 ENCOUNTER — Ambulatory Visit: Payer: Medicare Other | Attending: Internal Medicine

## 2019-08-28 DIAGNOSIS — Z23 Encounter for immunization: Secondary | ICD-10-CM | POA: Insufficient documentation

## 2019-08-28 NOTE — Progress Notes (Signed)
   Covid-19 Vaccination Clinic  Name:  Megan Mahoney    MRN: [id number] DOB: 1945/07/16  08/28/2019  [name] was observed post Covid-19 immunization for 15 minutes without incidence. She was provided with Vaccine Information Sheet and instruction to access the V-Safe system.   [name] was instructed to call 911 with any severe reactions post vaccine: Marland Kitchen Difficulty breathing  . Swelling of your face and throat  . A fast heartbeat  . A bad rash all over your body  . Dizziness and weakness    Immunizations Administered    Name Date Dose VIS Date Route   Pfizer COVID-19 Vaccine 08/28/2019  5:22 PM 0.3 mL 06/09/2019 Intramuscular   Manufacturer: [place]   Lot: [id number]   [place]: [id number]

## 2019-09-11 DIAGNOSIS — Z86018 Personal history of other benign neoplasm: Secondary | ICD-10-CM | POA: Diagnosis not present

## 2019-09-11 DIAGNOSIS — L821 Other seborrheic keratosis: Secondary | ICD-10-CM | POA: Diagnosis not present

## 2019-09-11 DIAGNOSIS — Z85828 Personal history of other malignant neoplasm of skin: Secondary | ICD-10-CM | POA: Diagnosis not present

## 2019-09-11 DIAGNOSIS — D225 Melanocytic nevi of trunk: Secondary | ICD-10-CM | POA: Diagnosis not present

## 2019-09-11 DIAGNOSIS — L814 Other melanin hyperpigmentation: Secondary | ICD-10-CM | POA: Diagnosis not present

## 2019-09-11 DIAGNOSIS — Z85820 Personal history of malignant melanoma of skin: Secondary | ICD-10-CM | POA: Diagnosis not present

## 2019-09-11 DIAGNOSIS — L570 Actinic keratosis: Secondary | ICD-10-CM | POA: Diagnosis not present

## 2019-09-11 DIAGNOSIS — L309 Dermatitis, unspecified: Secondary | ICD-10-CM | POA: Diagnosis not present

## 2019-09-11 DIAGNOSIS — L578 Other skin changes due to chronic exposure to nonionizing radiation: Secondary | ICD-10-CM | POA: Diagnosis not present

## 2019-09-19 ENCOUNTER — Encounter: Payer: Self-pay | Admitting: Certified Nurse Midwife

## 2019-10-23 ENCOUNTER — Other Ambulatory Visit: Payer: Self-pay | Admitting: Endocrinology

## 2019-11-01 ENCOUNTER — Other Ambulatory Visit: Payer: Self-pay | Admitting: Obstetrics and Gynecology

## 2019-11-01 DIAGNOSIS — Z1231 Encounter for screening mammogram for malignant neoplasm of breast: Secondary | ICD-10-CM

## 2019-12-15 ENCOUNTER — Other Ambulatory Visit: Payer: Self-pay

## 2019-12-15 ENCOUNTER — Ambulatory Visit
Admission: RE | Admit: 2019-12-15 | Discharge: 2019-12-15 | Disposition: A | Discharge status: Home / Self Care | Payer: Medicare Other | Source: Ambulatory Visit | Attending: Obstetrics and Gynecology | Admitting: Obstetrics and Gynecology

## 2019-12-15 DIAGNOSIS — Z1231 Encounter for screening mammogram for malignant neoplasm of breast: Secondary | ICD-10-CM

## 2019-12-20 ENCOUNTER — Other Ambulatory Visit: Payer: Self-pay | Admitting: Obstetrics and Gynecology

## 2019-12-20 DIAGNOSIS — R928 Other abnormal and inconclusive findings on diagnostic imaging of breast: Secondary | ICD-10-CM

## 2019-12-24 ENCOUNTER — Other Ambulatory Visit: Payer: Self-pay | Admitting: Endocrinology

## 2019-12-29 ENCOUNTER — Ambulatory Visit
Admission: RE | Admit: 2019-12-29 | Discharge: 2019-12-29 | Disposition: A | Discharge status: Home / Self Care | Payer: Medicare Other | Source: Ambulatory Visit | Attending: Obstetrics and Gynecology | Admitting: Obstetrics and Gynecology

## 2019-12-29 ENCOUNTER — Other Ambulatory Visit: Payer: Self-pay

## 2019-12-29 DIAGNOSIS — R928 Other abnormal and inconclusive findings on diagnostic imaging of breast: Secondary | ICD-10-CM

## 2020-01-17 ENCOUNTER — Ambulatory Visit: Payer: Medicare Other | Admitting: Obstetrics and Gynecology

## 2020-01-29 DIAGNOSIS — L720 Epidermal cyst: Secondary | ICD-10-CM | POA: Diagnosis not present

## 2020-01-29 DIAGNOSIS — L821 Other seborrheic keratosis: Secondary | ICD-10-CM | POA: Diagnosis not present

## 2020-02-08 ENCOUNTER — Encounter: Payer: Self-pay | Admitting: Genetic Counselor

## 2020-02-19 DIAGNOSIS — H52203 Unspecified astigmatism, bilateral: Secondary | ICD-10-CM | POA: Diagnosis not present

## 2020-02-19 DIAGNOSIS — H40033 Anatomical narrow angle, bilateral: Secondary | ICD-10-CM | POA: Diagnosis not present

## 2020-02-19 DIAGNOSIS — H5213 Myopia, bilateral: Secondary | ICD-10-CM | POA: Diagnosis not present

## 2020-02-19 NOTE — Progress Notes (Signed)
74 y.o. G83P2002 Married Caucasian female here for annual exam.    Has a left breast cyst. Her next breast imaging is due in July, 2022.   Had a right axillary lymph node resections, which were benign in December 2020.   Taking Actonel for her osteoporosis through Dr. Dwyane Dee.   Having fecal incontinence.  Off Lialda.  Using clobetasol once a week.  Notices if she skips a dosage.   Covid vaccine completed September 01, 2019.  PCP:  Milagros Evener, MD   Patient's last menstrual period was 04/29/1978 (within years).           Sexually active: No.  The current method of family planning is status post hysterectomy.    Exercising: No.  The patient does not participate in regular exercise at present. Smoker:  no  Health Maintenance: Pap: 07-17-12 Neg:Neg HR HPV History of abnormal Pap:  no MMG: 12-15-19 3D/Lt.Br.poss mass;Rt.Br.Neg/density D--Diag.Lt.w/US revealed benign left breast cyst at 2 o'clock/screening [id number] Colonoscopy: 04-27-18 normal per patient BMD:  12-28-18  Result :Osteopenia instead of Osteoporosis TDaP:  07-02-17 Gardasil:   no HIV: Unsure Hep C:Neg in the past Screening Labs:  PCP.   reports that she has never smoked. She has never used smokeless tobacco. She reports that she does not drink alcohol and does not use drugs.  Past Medical History:  Diagnosis Date  . Blood transfusion without reported diagnosis 1970's   multiple--following desmoid tumor surgery  . [place]   . [place]   Rt. breast ca  . GERD (gastroesophageal reflux disease)   . Heart murmur   . Heart murmur   . History of breast cancer   . Incisional hernia   . Lichen sclerosus    --vulva  . Low vitamin D level 2018  . Osteoporosis 2018  . Sarcoma of [place] extremity ([facility]   . Tendonitis of ankle, left   . Ulcerative colitis ([facility]     Past Surgical History:  Procedure Laterality Date  . ABDOMINAL HYSTERECTOMY  1979   TAH/BSO  . AUGMENTATION MAMMAPLASTY Right   .  BREAST SURGERY  1997   Rt. mastectomy/reconstruction  . 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 MASTECTOMY Right 1997  . melanoma removal  2002   --rt. thigh  . NEPHRECTOMY  1979  . RADIOACTIVE SEED GUIDED EXCISIONAL BREAST BIOPSY Right 06/08/2019   Procedure: RIGHT AXILLARY NODE RADIOACTIVE SEED GUIDED EXCISION;  Surgeon: WRolm Bookbinder MD;  [place] [facility]  Service: General;  Laterality: Right;  . SIGMOID RESECTION / RECTOPEXY    . SQUAMOUS CELL CARCINOMA EXCISION  2019   removed right thigh   . TONSILLECTOMY AND ADENOIDECTOMY      Current Outpatient Medications  Medication Sig Dispense Refill  . Acetaminophen (TYLENOL) 325 MG CAPS Take by mouth as needed.    .Marland KitchenApoaequorin (PREVAGEN PO) Take by mouth.    . Cholecalciferol (VITAMIN D3) 1000 units CAPS Take 1 capsule by mouth daily.    . clobetasol ointment (TEMOVATE) 07.20% APPLY 1 APPLICATION TOPICALLY ONCE WEEKLY.  May use twice a day for 2 weeks at a time for a flare. 30 g 1  . famotidine (PEPCID) 20 MG tablet Take 20 mg by mouth 2 (two) times daily.    . Loratadine (CLARITIN PO) Take by mouth as needed.    .Marland KitchenMELATONIN PO Take by mouth as needed.     . Multiple Vitamin (  MULTIVITAMIN) capsule Take 1 capsule by mouth daily.    . risedronate (ACTONEL) 150 MG tablet TAKE 1 TABLET BY MOUTH EVERY 30 DAYS WITH 8 OZ OF WATER ON AN EMPTY STOMACH 1 tablet 2  . triamcinolone cream (KENALOG) 0.1 %     . trimethoprim (TRIMPEX) 100 MG tablet Take 100 mg by mouth daily.     No current facility-administered medications for this visit.    Family History  Problem Relation Age of Onset  . Ovarian cancer Sister 40       dec  . Cancer Sister   . Deep vein thrombosis Mother   . Cancer Father   . Hypertension Father   . Cancer Maternal Grandmother 90       colon  . Transient ischemic attack Maternal Grandmother   . Breast cancer Paternal Grandmother 10    Review of Systems   All other systems reviewed and are negative.   Exam:   BP 136/74 (Cuff Size: Large)   Pulse 76   Resp 14   Ht _0  (1.651 m)   Wt 157 lb (71.2 kg)   LMP 04/29/1978 (Within Years)   BMI 26.13 kg/m     General appearance: alert, cooperative and appears stated age Head: normocephalic, without obvious abnormality, atraumatic Neck: no adenopathy, supple, symmetrical, trachea midline and thyroid normal to inspection and palpation Lungs: clear to auscultation bilaterally Breasts: right - absent with reconstruction.  Left - normal appearance, no masses or tenderness, No nipple retraction or dimpling, No nipple discharge or bleeding, No axillary adenopathy Heart: regular rate and rhythm Abdomen: incision and hernia noted.   Extremities: extremities normal, atraumatic, no cyanosis or edema Skin: skin color, texture, turgor normal. No rashes or lesions Lymph nodes: cervical, supraclavicular, and axillary nodes normal. Neurologic: grossly normal  Pelvic: External genitalia: fusion of labia minora.              No abnormal inguinal nodes palpated.              Urethra:  normal appearing urethra with no masses, tenderness or lesions              Bartholins and Skenes: normal                 Vagina: pale mucosa.  No lesions.              Cervix:absent              Pap taken: No. Bimanual Exam:  Uterus:  absent              Adnexa: no mass, fullness, tenderness              Rectal exam: No.. Declines.   Chaperone was present for exam.  Assessment:   Well woman visit with normal exam. Status post TAH/BSO.  Hx sarcoma of left leg.  Status post right mastectomy for breast cancer. FH breast, ovarian, colon cancer. Patient is BRCA negative.  Osteoporosis. On Actonel.  Endocrinology following.  Lichen sclerosus. Using clobetasol. Incisional hernia. Hx nephrectomy. Ulcerative colitis.  Fecal incontinence.   Plan: Mammogram screening discussed. Self breast awareness reviewed. Pap  and HR HPV as above. Guidelines for Calcium, Vitamin D, regular exercise program including cardiovascular and weight bearing exercise. Refill of Clobetasol.  Use twice weekly.  Consider vitamin E suppositories for vaginal atrophy if desired.  BMD in 2022.  Try Metamucil.  Return to GI.  We discussed incontinence products. Follow up annually  and prn.   After visit summary provided.

## 2020-02-20 ENCOUNTER — Ambulatory Visit (INDEPENDENT_AMBULATORY_CARE_PROVIDER_SITE_OTHER): Payer: Medicare Other | Admitting: Obstetrics and Gynecology

## 2020-02-20 ENCOUNTER — Encounter: Payer: Self-pay | Admitting: Obstetrics and Gynecology

## 2020-02-20 ENCOUNTER — Other Ambulatory Visit: Payer: Self-pay

## 2020-02-20 VITALS — BP 136/74 | HR 76 | Resp 14 | Ht 65.0 in | Wt 157.0 lb

## 2020-02-20 DIAGNOSIS — N904 Leukoplakia of vulva: Secondary | ICD-10-CM

## 2020-02-20 DIAGNOSIS — Z01419 Encounter for gynecological examination (general) (routine) without abnormal findings: Secondary | ICD-10-CM | POA: Diagnosis not present

## 2020-02-20 MED ORDER — CLOBETASOL PROPIONATE 0.05 % EX OINT: TOPICAL_OINTMENT | CUTANEOUS | 1 refills | Status: AC

## 2020-02-20 NOTE — Patient Instructions (Addendum)

## 2020-03-05 DIAGNOSIS — R350 Frequency of micturition: Secondary | ICD-10-CM | POA: Diagnosis not present

## 2020-03-05 DIAGNOSIS — N3021 Other chronic cystitis with hematuria: Secondary | ICD-10-CM | POA: Diagnosis not present

## 2020-03-25 ENCOUNTER — Other Ambulatory Visit: Payer: Self-pay | Admitting: Endocrinology

## 2020-03-26 ENCOUNTER — Telehealth: Payer: Self-pay | Admitting: Endocrinology

## 2020-03-26 NOTE — Telephone Encounter (Signed)
ok 

## 2020-03-26 NOTE — Telephone Encounter (Signed)
Please advise if patient Risedronate can be filled for 1 years instead if 3 months.

## 2020-03-26 NOTE — Telephone Encounter (Signed)
Patient called to ask if her Risedronate can be filled for  1 year instead of just for 3 months

## 2020-03-27 MED ORDER — RISEDRONATE SODIUM 150 MG PO TABS: ORAL_TABLET | ORAL | 11 refills | Status: DC

## 2020-04-02 ENCOUNTER — Ambulatory Visit: Payer: Medicare Other | Attending: Internal Medicine

## 2020-04-02 DIAGNOSIS — Z23 Encounter for immunization: Secondary | ICD-10-CM

## 2020-04-02 NOTE — Progress Notes (Signed)
   Covid-19 Vaccination Clinic  Name:  Megan Mahoney    MRN: [id number] DOB: 07/27/1945  04/02/2020  Megan Mahoney was observed post Covid-19 immunization for 15 minutes without incident. She was provided with Vaccine Information Sheet and instruction to access the V-Safe system.   Megan Mahoney was instructed to call 911 with any severe reactions post vaccine: Marland Kitchen Difficulty breathing  . Swelling of face and throat  . A fast heartbeat  . A bad rash all over body  . Dizziness and weakness

## 2020-05-07 IMAGING — US US BREAST*R* LIMITED INC AXILLA
1 series · 8 of 8 positions shown · non-contrast
Comparison: Previous exam(s).

CLINICAL DATA: 73-year-old female with history of right breast
cancer status post mastectomy in 6007. Recent PET-CT showed a
hypermetabolic right axillary lymph node and small right internal
mammary lymph node.

EXAM:
ULTRASOUND OF THE RIGHT BREAST

[Series 1: us breast*right* limited inc axilla · 0.09mm/px · 8 of 8 slices shown]
[im 1/8]
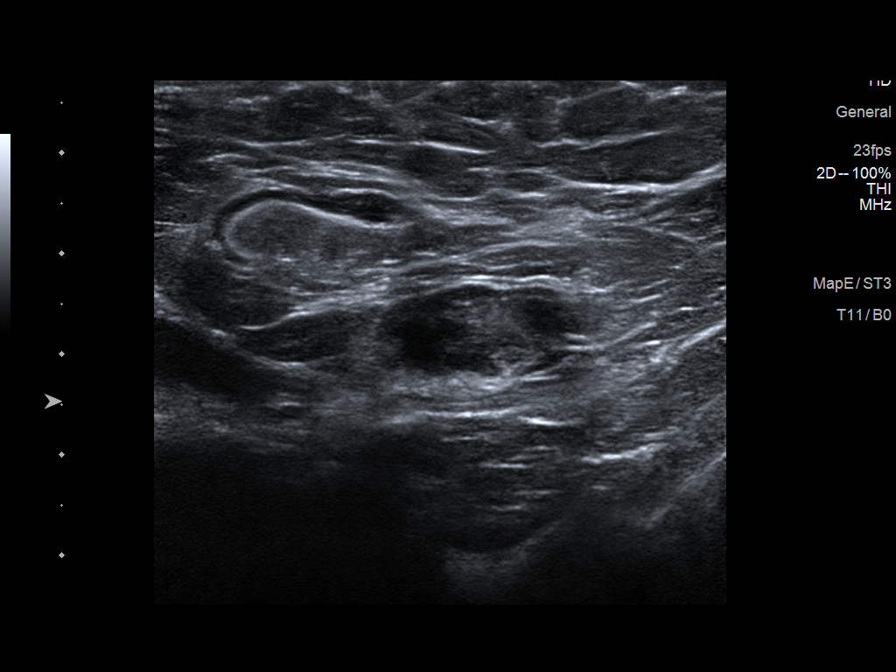
[im 2/8]
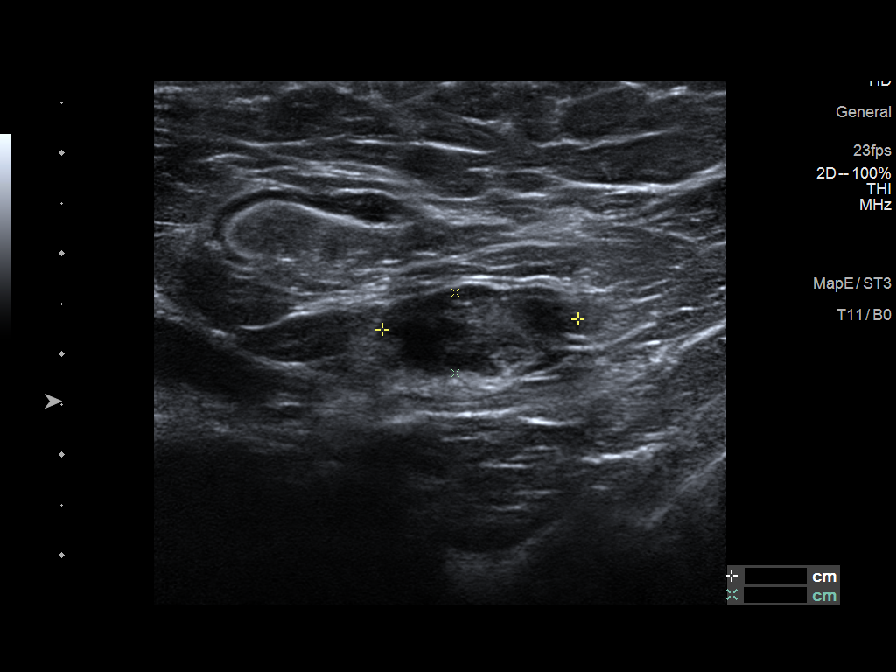
[im 3/8]
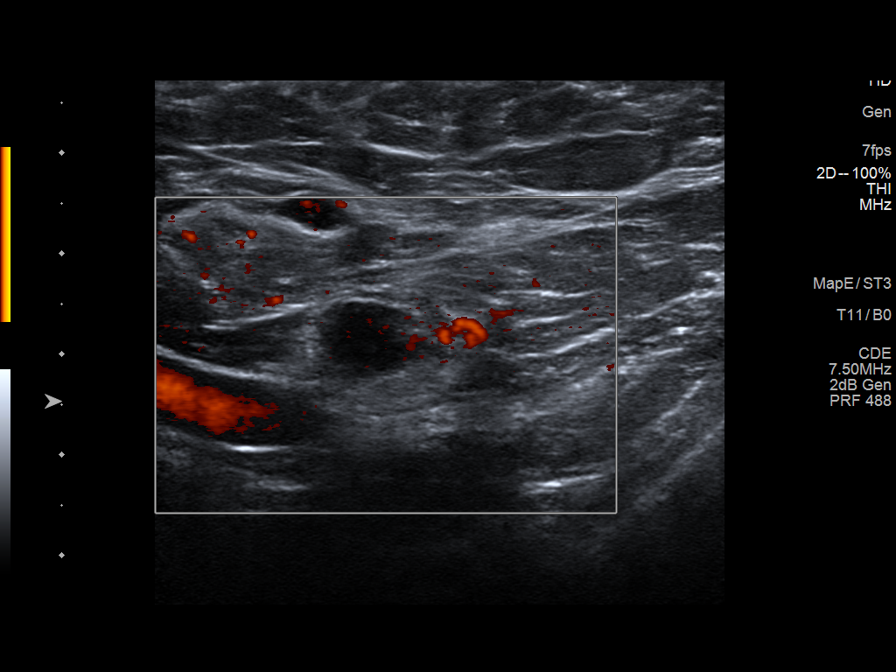
[im 4/8]
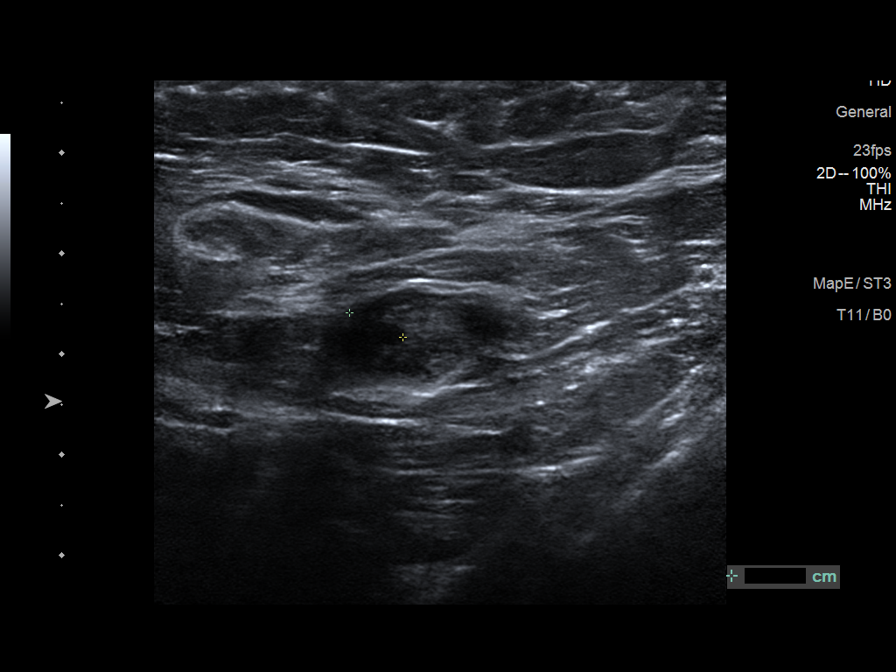
[im 5/8]
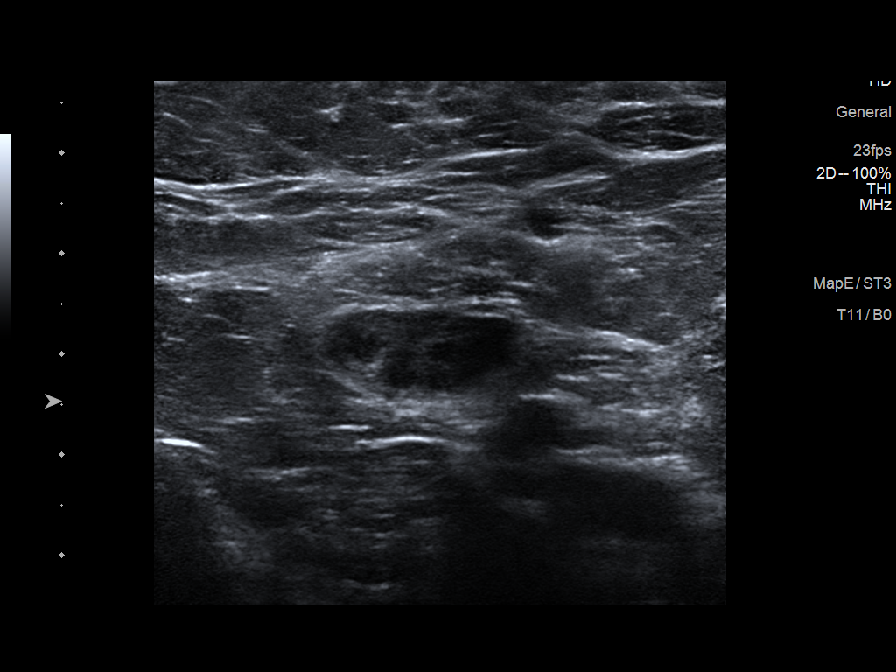
[im 6/8]
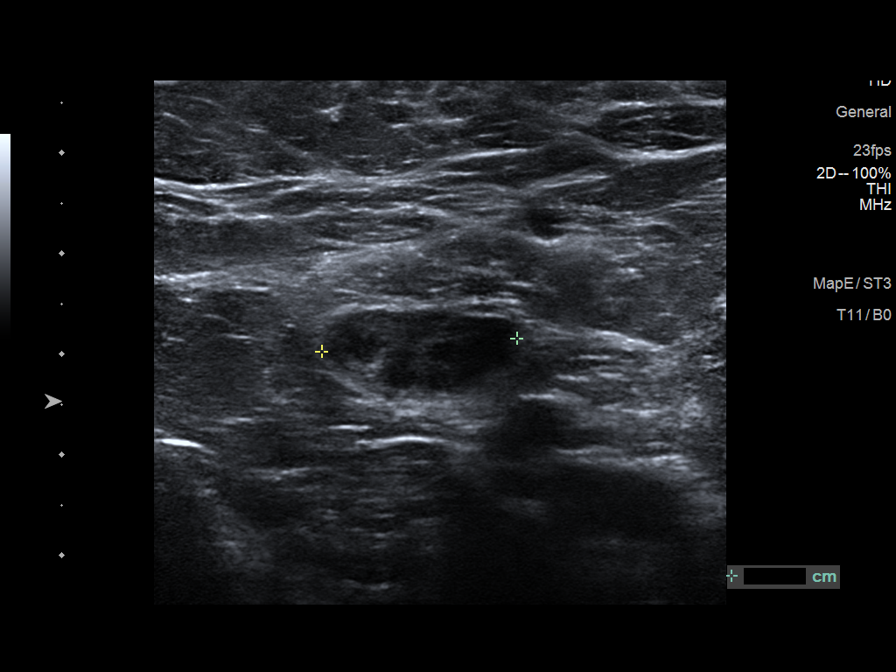
[im 7/8]
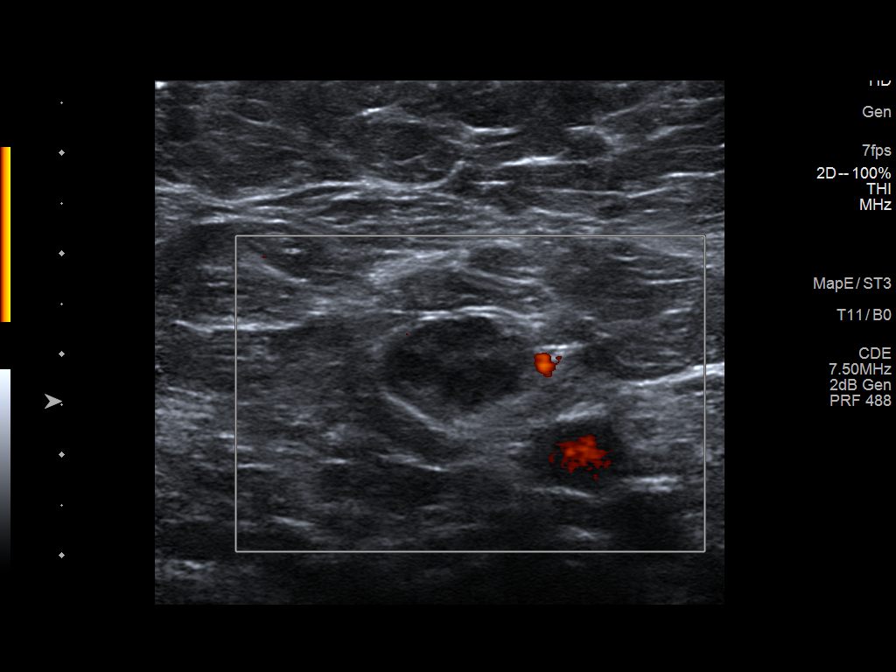
[im 8/8]
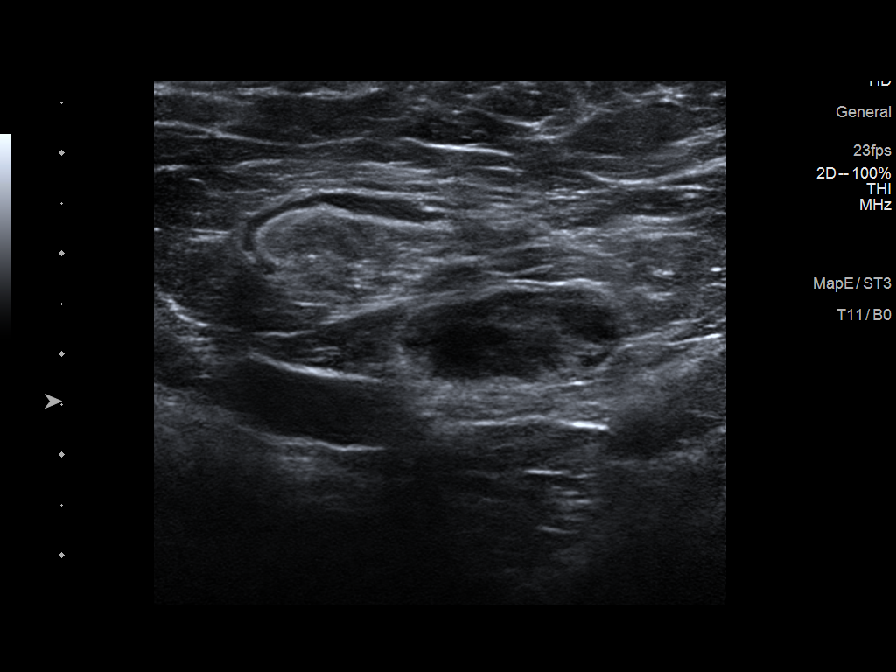

[8 of 8 positions shown; findings below may reference images not displayed]

FINDINGS: Targeted ultrasound is performed, showing a right axillary lymph
node with focal cortical thickening measuring up to 6 mm (directly
adjacent and posterior to the lymph node is a blood vessel). This
lymph node likely corresponds with the lymph node seen on the PET
scan. Sonographic evaluation medially does not show an abnormal
internal mammary lymph node.
IMPRESSION: Indeterminate right axillary lymph node.

RECOMMENDATION:
Ultrasound-guided core biopsy of the indeterminate right axillary
lymph node (please note there is a blood vessel adjacent to the
lymph node) is recommended. The biopsy will be scheduled at the
patient's convenience

I have discussed the findings and recommendations with the patient.
If applicable, a reminder letter will be sent to the patient
regarding the next appointment.

BI-RADS CATEGORY  4: Suspicious.

## 2020-05-08 IMAGING — US US AXILLARY NODE CORE BIOPSY RIGHT
1 series · 12 of 17 positions shown · non-contrast
Comparison: Previous exam(s).
COMPARISON: Previous exam(s).

Addendum:
CLINICAL DATA: 73-year-old with a personal history of malignant
RIGHT mastectomy in 2551 at [REDACTED] [HOSPITAL]. A recent
PET-CT demonstrated a hypermetabolic RIGHT axillary lymph node and
an equivocal RIGHT internal mammary lymph node. Diagnostic
ultrasound yesterday confirmed an approximate 2 cm pathologic RIGHT
axillary node which is sampled today.

EXAM:
ULTRASOUND GUIDED CORE NEEDLE BIOPSY OF A RIGHT AXILLARY NODE

[Series 1: us axillary node core biopsy right · 0.07mm/px · 12 of 17 slices shown]
[im 1/17]
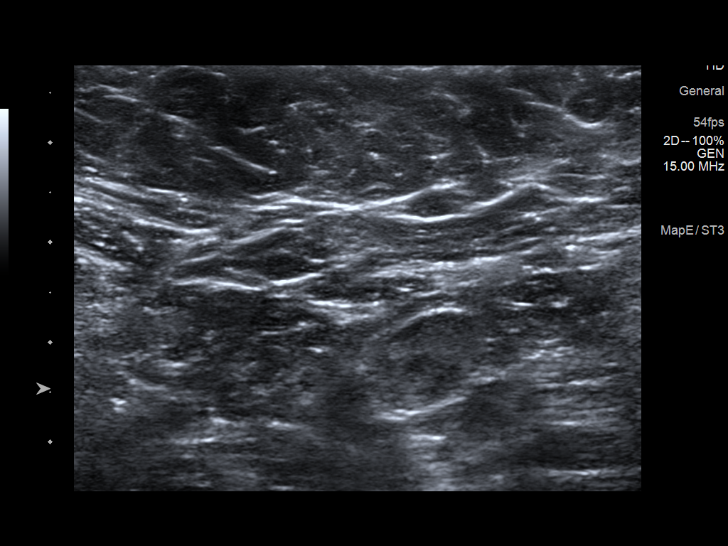
[im 3/17]
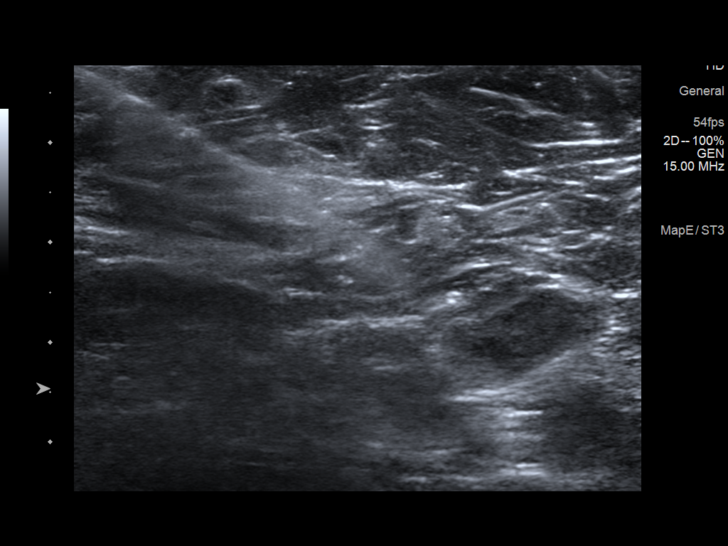
[im 4/17]
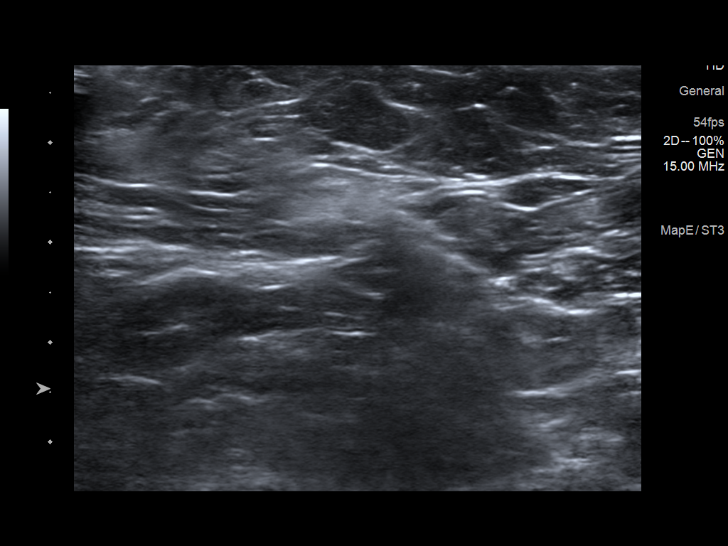
[im 5/17]
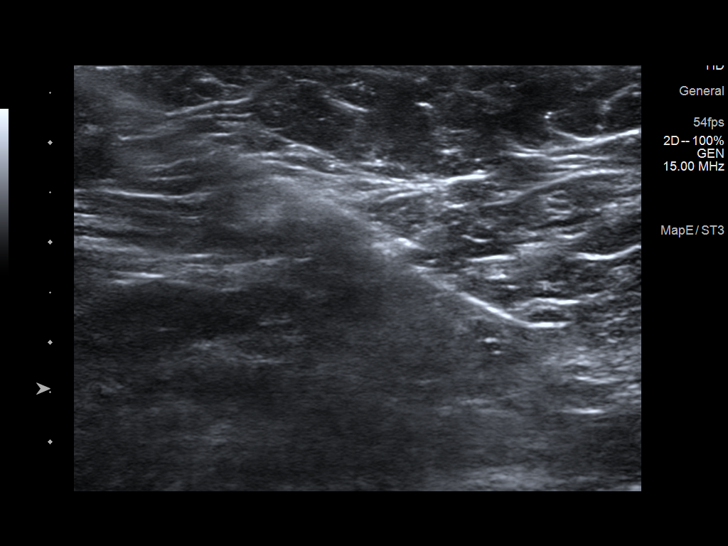
[im 7/17]
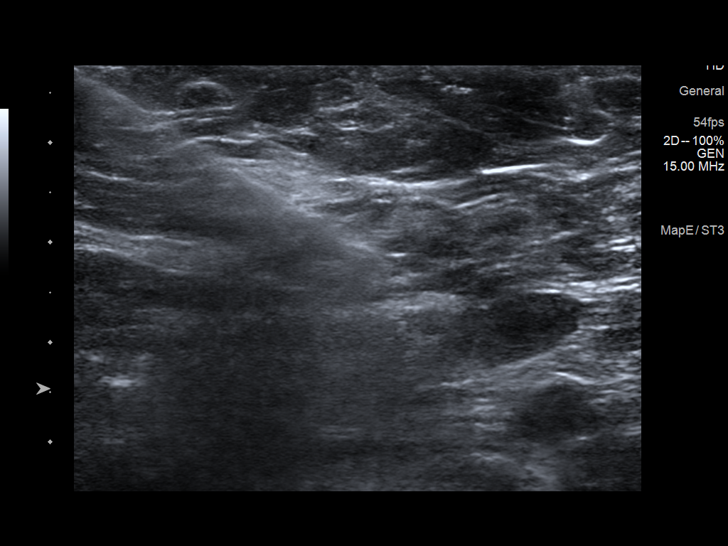
[im 8/17]
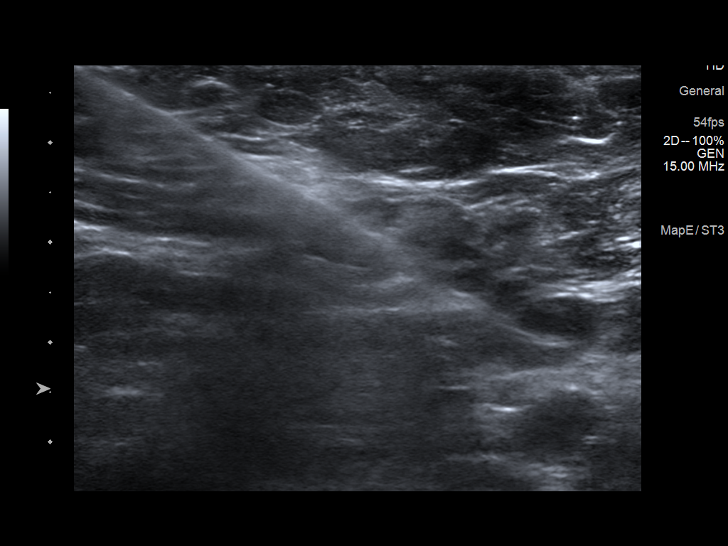
[im 10/17]
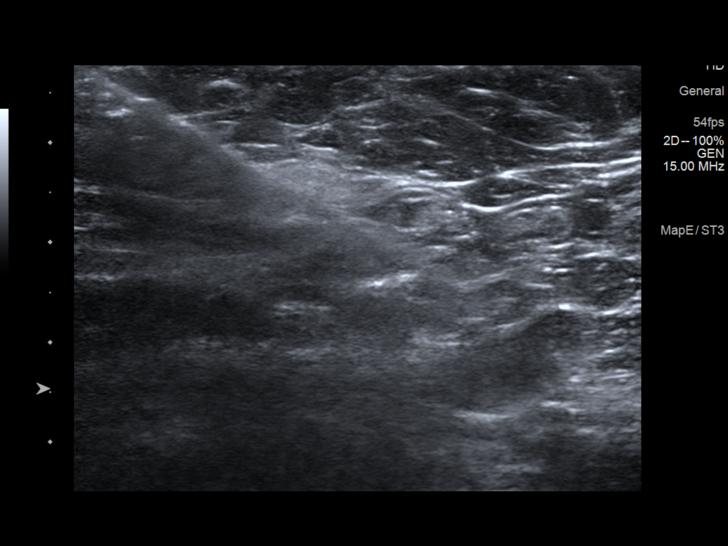
[im 11/17]
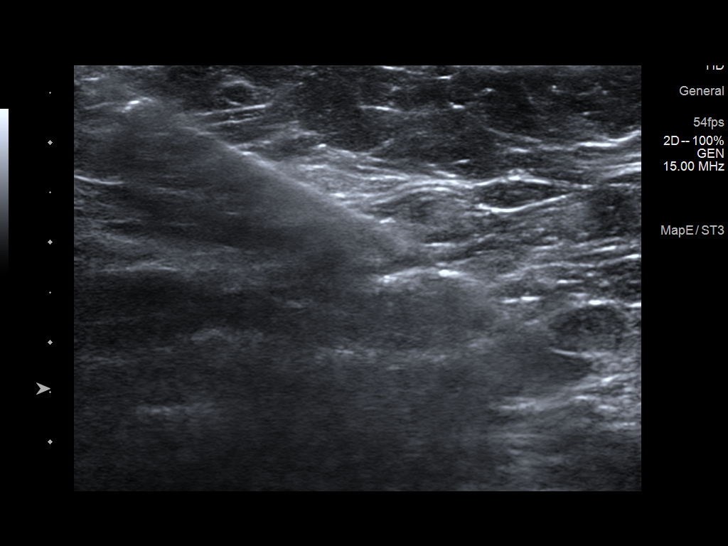
[im 13/17]
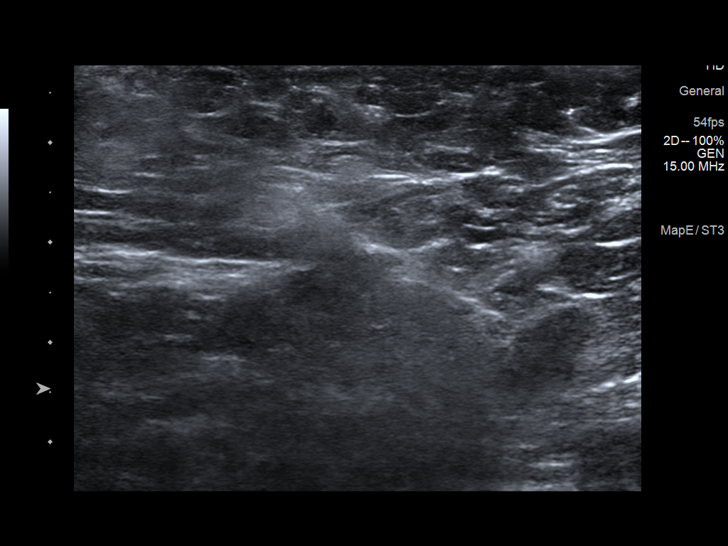
[im 14/17]
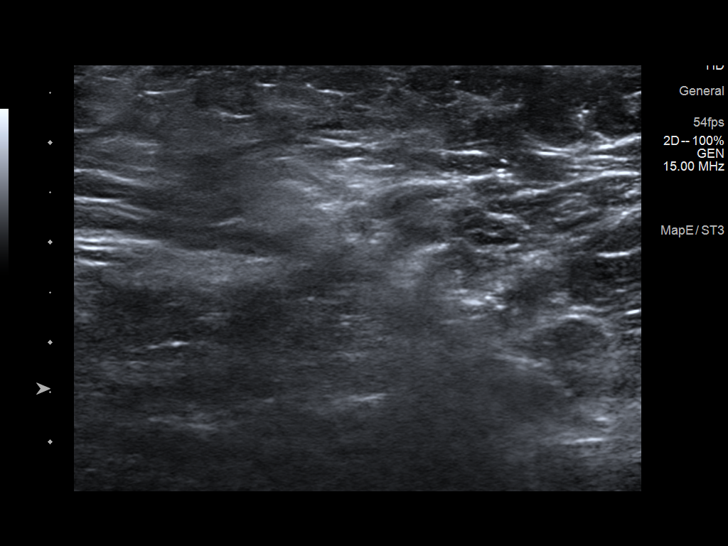
[im 15/17]
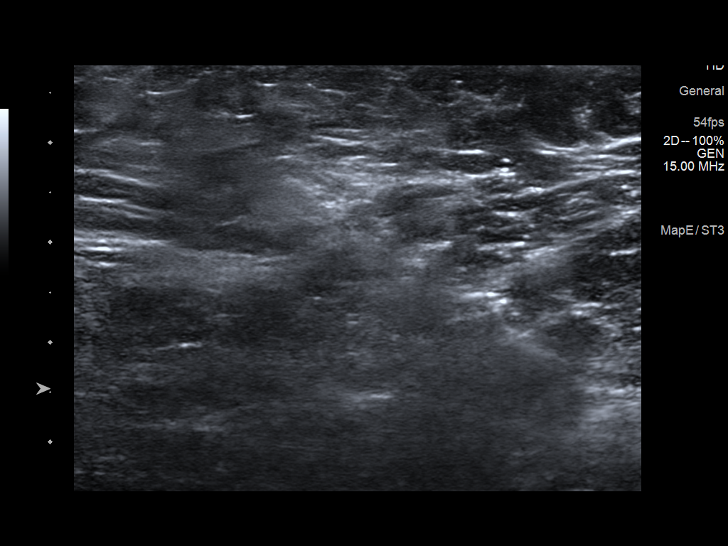
[im 17/17]
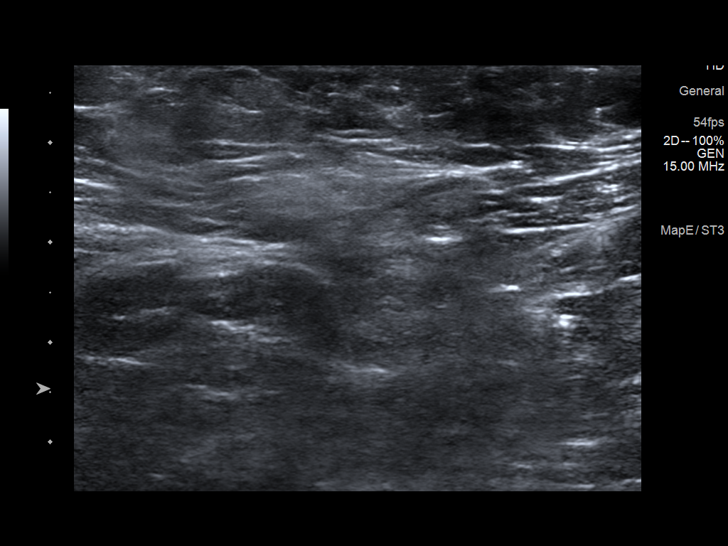

[12 of 17 positions shown; findings below may reference images not displayed]



Using sterile technique with chlorhexidine as skin antisepsis, 1%
lidocaine and 1% lidocaine with epinephrine as local anesthetic,
under direct ultrasound visualization, a 14 gauge Kyrie core
needle device placed through a 13 gauge introducer needle was used
to perform biopsy of the pathologic RIGHT axillary lymph node using
an inferolateral approach. At the conclusion of the procedure a Q
shaped tissue marker clip was deployed into the biopsy cavity.
IMPRESSION: Ultrasound guided biopsy of a pathologic RIGHT axillary lymph node.
No apparent complications.

ADDENDUM:
Pathology revealed BENIGN LYMPH NODE of the Right axilla. This was
found to be possibly discordant by Dr. Rossy Reinhold, given the
hypermetabolic activity on recent PET-CT, and therefore excisional
biopsy is recommended.

Pathology results were discussed with the patient by telephone. The
patient reported doing well after the biopsy with tenderness at the
site. Post biopsy instructions and care were reviewed and questions
were answered. The patient was encouraged to call The [REDACTED]

Surgical consultation has been arranged with Dr. Seon Il Klinginsmith
at [REDACTED] on April 27, 2019.

Please note that on the report of the PET-CT, MRI of the Brain
without and with contrast was recommended.

Pathology results reported by Nobumitsu Leme, RN on 04/21/2019.



Using sterile technique with chlorhexidine as skin antisepsis, 1%
lidocaine and 1% lidocaine with epinephrine as local anesthetic,
under direct ultrasound visualization, a 14 gauge Leniza Uvarov core
needle device placed through a 13 gauge introducer needle was used
to perform biopsy of the pathologic RIGHT axillary lymph node using
an inferolateral approach. At the conclusion of the procedure a Q
shaped tissue marker clip was deployed into the biopsy cavity.
IMPRESSION: Ultrasound guided biopsy of a pathologic RIGHT axillary lymph node.
No apparent complications.

## 2020-06-13 ENCOUNTER — Ambulatory Visit: Payer: Medicare Other | Admitting: Obstetrics and Gynecology

## 2020-07-26 ENCOUNTER — Other Ambulatory Visit: Payer: Self-pay | Admitting: *Deleted

## 2020-07-26 MED ORDER — RISEDRONATE SODIUM 150 MG PO TABS: ORAL_TABLET | ORAL | 11 refills | Status: DC

## 2020-08-01 ENCOUNTER — Other Ambulatory Visit: Payer: Self-pay | Admitting: *Deleted

## 2020-08-01 MED ORDER — RISEDRONATE SODIUM 150 MG PO TABS: ORAL_TABLET | ORAL | 5 refills | Status: AC

## 2021-02-20 ENCOUNTER — Ambulatory Visit: Payer: Medicare Other | Admitting: Obstetrics and Gynecology

## 2021-03-27 NOTE — Progress Notes (Signed)
[facility] INTERNAL MEDICINE - AN [place] [facility]                 Date of Exam: 03/28/2021         Patient ID: Aimee Wade is a 75 y.o. female.        Chief Complaint:    Chief Complaint   Patient presents with    Medicare Annual Wellness Visit             HPI:    HPI   Visit Type: Health Maintenance Visit  Reported Health: good  Dental: up to date  Vision: due  Hearing: normal  Immunization Status: Already received COVID-19 vaccination and booster  Exercise: could be better  Diet: reports healthy, allergic to high fructose corn syrup, sensitive to soy, sesame, diarrhea  Sleep: reports nocturia  Reproductive Health: postmenopausal and s/p hysterectomy  Safety Elements Used: uses seat belts    03/28/21:  Overall feeling well.  Previous PCP Dr. Vernie Murders in [facility], moved Nov 2021 to be closer to granddaughters.  Had right breast cancer s/p surgery.  History of desmoid tumor s/p left nephrectomy, radiation.  Follows gastroenterology, get colonoscopy every 5 years, previously on mesalamine.  History of recurrent UTI, wishes to follow up with urology.  Uses clobetasol cream vaginally.  Lives with husband, retired Runner, broadcasting/film/video.        ROS:    Review of Systems   Constitutional:  Negative for chills and fever.   Eyes:  Negative for visual disturbance.   Respiratory:  Negative for cough and shortness of breath.    Cardiovascular:  Negative for chest pain and leg swelling.   Gastrointestinal:  Negative for abdominal pain, constipation and diarrhea.   Genitourinary:  Positive for frequency. Negative for dysuria and hematuria.   Musculoskeletal:  Negative for arthralgias and myalgias.   Skin:  Negative for rash and wound.   Neurological:  Negative for dizziness, weakness, numbness and headaches.   Hematological:  Does not bruise/bleed easily.   Psychiatric/Behavioral:  Positive for sleep disturbance. Negative for confusion.            Assessment for Cognitive Impairment:  General Appearance: well groomed  Mood/Affect:  appropriate   Memory Assessment: denies change in memory    Depression Screening:   Over the past 2 weeks, the patient expresses little interest or pleasure in doing things: no  Over the past 2 weeks the patient felt down, depressed, or hopeless: no    Functional Ability:   Does the patient exhibit a steady gait: yes  How long did it take the patient to get up and walk from a sitting position: wnl  Is the patient self reliant: yes  Does the patient handle his/her own medications: yes  Does the patient handle his/her own money: yes    Instrumental Activities of Daily Living (IADL)  Because of a health or physical problem, do you need help to:  Shop - no  Do light housework - no  Walk across a room - no  Take a bath or a shower - no  Manage the household finances - no    Did you notice or did patient express any hearing difficulties: no  Did you notice or did patient express any vision difficulties: no  Were distance and reading eye charts used: no    Fall Assessment  Have you fallen in the past year?Marland Kitchen  Do you have problems with walking or balance? - no    Home Safety  Is the patient's home safe: yes  Lighting, loose objects/pets on the floor, type of rugs/carpet, steps or stairs, wet surfaces, clear path from bedroom to bathroom, accessible telephone, monitoring system ([employer]) - no    Advance Care Planning:   Patient was offered opportunity to discuss this: yes  Does patient have an Advance Directive: not on file         Problem List:    Patient Active Problem List   Diagnosis    History of right breast cancer    History of melanoma    History of sebaceous gland carcinoma    Osteoporosis without current pathological fracture, unspecified osteoporosis type    Lymphocytic colitis    Desmoid tumor of abdomen    History of left nephrectomy    Recurrent UTI             Current Meds:    No outpatient medications have been marked as taking for the 03/28/21 encounter (Office Visit) with Iris Pert, MD.           Allergies:    Allergies   Allergen Reactions    Codeine     Morphine Nausea Only    Sesame Oil     Soybean-Containing Drug Products               There is no immunization history on file for this patient.   Past Surgical History:    Past Surgical History:   Procedure Laterality Date    BUNIONECTOMY      HYSTERECTOMY  1979    MASTECTOMY Right     1996    NEPHRECTOMY Left 1979    desmoid tumor    TONSILLECTOMY  1957           Family History:    Family History   Problem Relation Age of Onset    Thyroid disease Mother     Macular degeneration Mother     Dementia Mother     Arthritis Mother     Clotting disorder Mother     Glaucoma Father     Macular degeneration Father     Hypertension Father     Ovarian cancer Sister 73    Colon cancer Maternal Grandmother 40    Breast cancer Paternal Grandmother     Valvular heart disease Son     Valve Surgery Son            Social History:    Social History     Tobacco Use    Smoking status: Never    Smokeless tobacco: Never   Substance Use Topics    Alcohol use: Not Currently    Drug use: Never          Vital Signs:    Vitals:    03/28/21 1349   BP: 128/80   BP Site: Right arm   Patient Position: Sitting   Cuff Size: Medium   Pulse: 78   Resp: 18   Temp: 97.7 F (36.5 C)   TempSrc: Temporal   SpO2: 98%   Weight: 71.2 kg (157 lb)   Height: 1.651 m (5\' 5" )      BP Readings from Last 3 Encounters:   03/28/21 128/80     Wt Readings from Last 3 Encounters:   03/28/21 71.2 kg (157 lb)     Body mass index is 26.13 kg/m.  A normal BMI is 18.5-24.9        Physical Exam:    Physical Exam  Constitutional:       General: She is not in acute distress.     Appearance: She is not ill-appearing.   HENT:      Head: Normocephalic.      Right Ear: Tympanic membrane normal.      Left Ear: Tympanic membrane normal.      Mouth/Throat:      Mouth: Mucous membranes are moist.      Pharynx: Oropharynx is clear.   Eyes:      Extraocular Movements: Extraocular movements intact.      Conjunctiva/sclera:  Conjunctivae normal.      Pupils: Pupils are equal, round, and reactive to light.   Cardiovascular:      Rate and Rhythm: Normal rate and regular rhythm.      Pulses: Normal pulses.      Heart sounds: Normal heart sounds.   Pulmonary:      Effort: Pulmonary effort is normal.      Breath sounds: Normal breath sounds.   Abdominal:      General: Bowel sounds are normal.      Palpations: Abdomen is soft.   Musculoskeletal:      Cervical back: Neck supple.      Right lower leg: No edema.      Left lower leg: No edema.   Skin:     General: Skin is warm and dry.   Neurological:      General: No focal deficit present.      Mental Status: She is alert.   Psychiatric:         Behavior: Behavior normal.          Assessment/Plan:      The 10-year ASCVD risk score (Arnett DK, et al., 2019) is: 12.4%    Values used to calculate the score:      Age: 22 years      Sex: Female      Is Non-Hispanic African American: No      Diabetic: No      Tobacco smoker: No      Systolic Blood Pressure: 128 mmHg      Is BP treated: No      HDL Cholesterol: 72 mg/dL      Total Cholesterol: 168 mg/dL    Maribella was seen today for medicare annual wellness visit.    Diagnoses and all orders for this visit:    Preventative health care    Osteoporosis without current pathological fracture, unspecified osteoporosis type  -     CBC and differential  -     Comprehensive metabolic panel  -     Ambulatory referral to Endocrinology    Recurrent UTI  -     Ambulatory referral to Urogynecology    Screening, lipid  -     Lipid panel    Encounter for screening mammogram for malignant neoplasm of breast  -     Mammo Digital Screening Bilateral W CAD; Future    Encounter for screening for malignant neoplasm of colon  -     Ambulatory referral to Gastroenterology    Abnormal finding of blood chemistry, unspecified   -     CBC and differential    Other orders  -     GFR  -     Hemolysis index    Preventative care: Greatest 10 year health risk is cardiovascular  disease, recommend regular exercise at least 150 minutes/week and heart healthy diet (I.e. more plant-based, Mediterranean diet).  Immunizations:  recommend influenza vaccination annually.  Already received COVID-19 vaccination.   Colonoscopy (45-75): [  ] up to date     [ x ] referred to GI  Mammogram (45-74): [  ] follows gyn   [ x ] ordered     DEXA:                         [  ] follows gyn   [ x ] address future visit  [  ] ordered    Osteoporosis: Recommend dietary calcium intake, vitamin D, weight bearing exercises.  Consider DEXA every 2 years.  On risedronate 150 mg monthly  Recurrent UTI: Can follow up with urogyn/urology          Follow-up:    Return in about 1 year (around 03/28/2022) for Medicare wellness.         Concha Norway, MD

## 2021-03-28 ENCOUNTER — Ambulatory Visit (INDEPENDENT_AMBULATORY_CARE_PROVIDER_SITE_OTHER): Payer: Medicare Other | Admitting: Internal Medicine

## 2021-03-28 ENCOUNTER — Encounter (INDEPENDENT_AMBULATORY_CARE_PROVIDER_SITE_OTHER): Payer: Self-pay | Admitting: Internal Medicine

## 2021-03-28 VITALS — BP 128/80 | HR 78 | Temp 97.7°F | Resp 18 | Ht 65.0 in | Wt 157.0 lb

## 2021-03-28 DIAGNOSIS — D481 Neoplasm of uncertain behavior of connective and other soft tissue: Secondary | ICD-10-CM | POA: Insufficient documentation

## 2021-03-28 DIAGNOSIS — K52832 Lymphocytic colitis: Secondary | ICD-10-CM | POA: Insufficient documentation

## 2021-03-28 DIAGNOSIS — Z85820 Personal history of malignant melanoma of skin: Secondary | ICD-10-CM | POA: Insufficient documentation

## 2021-03-28 DIAGNOSIS — Z853 Personal history of malignant neoplasm of breast: Secondary | ICD-10-CM | POA: Insufficient documentation

## 2021-03-28 DIAGNOSIS — M810 Age-related osteoporosis without current pathological fracture: Secondary | ICD-10-CM

## 2021-03-28 DIAGNOSIS — Z85828 Personal history of other malignant neoplasm of skin: Secondary | ICD-10-CM | POA: Insufficient documentation

## 2021-03-28 DIAGNOSIS — Z0000 Encounter for general adult medical examination without abnormal findings: Secondary | ICD-10-CM

## 2021-03-28 DIAGNOSIS — Z905 Acquired absence of kidney: Secondary | ICD-10-CM | POA: Insufficient documentation

## 2021-03-28 DIAGNOSIS — Z1211 Encounter for screening for malignant neoplasm of colon: Secondary | ICD-10-CM

## 2021-03-28 DIAGNOSIS — Z13220 Encounter for screening for lipoid disorders: Secondary | ICD-10-CM

## 2021-03-28 DIAGNOSIS — N390 Urinary tract infection, site not specified: Secondary | ICD-10-CM

## 2021-03-28 DIAGNOSIS — Z1231 Encounter for screening mammogram for malignant neoplasm of breast: Secondary | ICD-10-CM

## 2021-03-28 DIAGNOSIS — R799 Abnormal finding of blood chemistry, unspecified: Secondary | ICD-10-CM

## 2021-03-28 HISTORY — DX: Lymphocytic colitis: K52.832

## 2021-03-28 LAB — COMPREHENSIVE METABOLIC PANEL
ALT: 27 U/L (ref 0–55)
AST (SGOT): 26 U/L (ref 5–41)
Albumin/Globulin Ratio: 1.3 (ref 0.9–2.2)
Albumin: 4.2 g/dL (ref 3.5–5.0)
Alkaline Phosphatase: 77 U/L (ref 37–117)
Anion Gap: 13 (ref 5.0–15.0)
BUN: 17 mg/dL (ref 7.0–21.0)
Bilirubin, Total: 0.3 mg/dL (ref 0.2–1.2)
CO2: 25 mEq/L (ref 17–29)
Calcium: 9.5 mg/dL (ref 7.9–10.2)
Chloride: 103 mEq/L (ref 99–111)
Creatinine: 0.8 mg/dL (ref 0.4–1.0)
Globulin: 3.2 g/dL (ref 2.0–3.6)
Glucose: 98 mg/dL (ref 70–100)
Potassium: 4.2 mEq/L (ref 3.5–5.3)
Protein, Total: 7.4 g/dL (ref 6.0–8.3)
Sodium: 141 mEq/L (ref 135–145)

## 2021-03-28 LAB — CBC AND DIFFERENTIAL
Absolute NRBC: 0 10*3/uL (ref 0.00–0.00)
Basophils Absolute Automated: 0.03 10*3/uL (ref 0.00–0.08)
Basophils Automated: 0.6 %
Eosinophils Absolute Automated: 0.14 10*3/uL (ref 0.00–0.44)
Eosinophils Automated: 2.9 %
Hematocrit: 43.7 % (ref 34.7–43.7)
Hgb: 14.2 g/dL (ref 11.4–14.8)
Immature Granulocytes Absolute: 0.01 10*3/uL (ref 0.00–0.07)
Immature Granulocytes: 0.2 %
Lymphocytes Absolute Automated: 1.5 10*3/uL (ref 0.42–3.22)
Lymphocytes Automated: 30.7 %
MCH: 29.8 pg (ref 25.1–33.5)
MCHC: 32.5 g/dL (ref 31.5–35.8)
MCV: 91.6 fL (ref 78.0–96.0)
MPV: 10.7 fL (ref 8.9–12.5)
Monocytes Absolute Automated: 0.41 10*3/uL (ref 0.21–0.85)
Monocytes: 8.4 %
Neutrophils Absolute: 2.79 10*3/uL (ref 1.10–6.33)
Neutrophils: 57.2 %
Nucleated RBC: 0 /100 WBC (ref 0.0–0.0)
Platelets: 249 10*3/uL (ref 142–346)
RBC: 4.77 10*6/uL (ref 3.90–5.10)
RDW: 13 % (ref 11–15)
WBC: 4.88 10*3/uL (ref 3.10–9.50)

## 2021-03-28 LAB — LIPID PANEL
Cholesterol / HDL Ratio: 2.3 Index
Cholesterol: 168 mg/dL (ref 0–199)
HDL: 72 mg/dL (ref 40–9999)
LDL Calculated: 67 mg/dL (ref 0–99)
Triglycerides: 147 mg/dL (ref 34–149)
VLDL Calculated: 29 mg/dL (ref 10–40)

## 2021-03-28 LAB — HEMOLYSIS INDEX: Hemolysis Index: 10 Index (ref 0–24)

## 2021-03-28 LAB — GFR: EGFR: >60

## 2021-04-01 ENCOUNTER — Encounter (INDEPENDENT_AMBULATORY_CARE_PROVIDER_SITE_OTHER): Payer: Self-pay | Admitting: Internal Medicine

## 2021-04-01 DIAGNOSIS — N390 Urinary tract infection, site not specified: Secondary | ICD-10-CM | POA: Insufficient documentation

## 2021-04-01 NOTE — Patient Instructions (Signed)
MEDICARE WELLNESS PERSONAL PREVENTION PLAN   As part of the Medicare Wellness portion of your visit today, we are providing you with this personalized preventative plan of care. We have listed below some of the preventative services that are recommended for patients based upon their age and gender. These recommendations are taken directly from the [place] [employer] (USPSTF) and the [employer] (ACIP).     Health Maintenance   Topic Date Due    Statin Use  Never done    Colorectal Cancer Screening  Never done    Advance Directive on File  Never done    MAMMOGRAM  Never done    DXA Scan  Never done    COVID-19 Vaccine (1) Never done    DEPRESSION SCREENING  Never done    HEPATITIS C SCREENING  Never done    Pneumonia Vaccine Age 73+ (1 - PCV) Never done    Shingrix Vaccine 50+ (2) 05/23/2021    FALLS RISK ANNUAL  03/28/2022    Medicare Annual Wellness Visit  03/28/2022    Tetanus Ten-Year  06/30/2027    INFLUENZA VACCINE  Addressed       Health Maintenance Topics with due status: Overdue       Topic Date Due    Statin Use Never done    Colorectal Cancer Screening Never done    Advance Directive on File Never done    MAMMOGRAM Never done    DXA Scan Never done    COVID-19 Vaccine Never done    DEPRESSION SCREENING Never done    HEPATITIS C SCREENING Never done    Pneumonia Vaccine Age 73+ Never done          There is no immunization history on file for this patient.     Your major risk factors: Osteoporosis and Falls Risk, history of cancer  Recommendations for improvement:  Exercise - Engage in age appropriate exercise 150 minutes per week   Osteoporosis - Weight bearing exercises (walking, yoga, Tai-Chi) 150 minutes per week.  Here is a good reference for tips on engaging in weight bearing exercise - https://www.bones.ToysManual.co.za  Falls Prevention - Here is a good reference website for tips on  avoiding falls - BikingRewards.pl    The list below includes many common screening recommendations but is not meant to be comprehensive. You may be eligible for other preventative services depending upon your personal risk factors.   Colorectal Cancer Screening - All adults age 7-75 should undergo periodic colorectal cancer screening. The decision to screen for colorectal cancer in adults aged 26 to 34 years should be an individual one,taking into account your overall health and prior screening history.   Breast Cancer Screening - Women age 73-74 should have mammograms every other year (please note that this recommendation may not be appropriate for every woman - your physician can answer specific questions you may have). The USPSTF concludes that the current evidence is insufficient to assess the balance of benefits and harms of screening mammography in women aged 26 years or older.    Cervical Cancer Screening - Women over 68 do not require pap smears as long as prior screening has been normal and are not otherwise at high risk for cervical cancer. For women aged 83 to 52 years, the USPSTF recommends screening every 3 years with cervical cytology alone, every 5 years with high-risk human papillomavirus (hrHPV) testing alone, or every 5 years with hrHPV testing in combination with cytology (  cotesting).   Osteoporosis Screening -  The USPSTF recommends screening for osteoporosis with bone measurement testing to prevent osteoporotic fractures in women 65 years and older.  Hepatitis C Screening - Recommend screening for hepatitis C virus (HCV) infection in all adults aged 39 to 4 years.  Lung cancer Screening - Recommend annual screening for lung cancer with low-dose computed tomography (LDCT) in adults ages 71 to 8 years who have a 20 pack-year smoking history and currently smoke or have quit within the past 15 years.  Recommended Vaccinations   Influenza one dose annually    Tetanus/diphtheria one booster every 10 years   Zoster/Shingles - Shingrix two doses after age 23 (second dose given 2-6 months after first dose)  Pneumovax (PPSV23) one dose for adults aged ?65 years  Prevnar(PCV13) shared clinical decision-making is recommended regarding administration of this vaccine to persons aged ?65 years who do not have an immunocompromising condition, cerebrospinal fluid leak, or cochlear implant.     PERSONAL PREVENTION PLAN   Your Personal Prevention Plan is based on your overall health and your responses to the health questionnaire you completed. The following information is for you to review in addition to the recommendations, referrals, and tests we have discussed at your visit.     Physical Activity:   Physical activity can help you maintain a healthy weight, prevent or control illness, reduce stress, and sleep better. It can also help you improve your balance to avoid falls. Try to build up to and maintain a total of 30 minutes of activity each day. If you are able, try walking, doing yard or housework, and taking the stairs more often. You can also strengthen your muscles with exercises done while sitting or lying down.   Emotional Health:   Feeling "down in the dumps" or anxious every now and then is a natural part of life. If this feeling lasts for a few weeks or more, talk with me as soon as possible. It could be a sign of a problem that needs treatment. There are many types of treatment available.   Falls:   You can reduce your risk of falling by making changes in your home. Remove items that may cause tripping, improve lighting, and consider installing grab bars.   Talk with me if you have problems with balance and walking. To prevent falls, you may need your vision, hearing, or blood pressure checked. Exercises to improve your strength and balance, or using a cane or walker, may help. Review your medicines with me at every visit, because some can affect balance. Please be  sure to let me know if you fall or are fearful you may fall.   Urinary Leakage:   Urine leakage is common, but it is not a normal part of aging. Talk with me about any urine leakage so that the cause can be found and treated. Treatment can include bladder training, exercises, medicine or surgery.   Pain:   We all have aches and pains at times, but chronic pain can change how you feel and live every day. Please talk with me about any symptoms of chronic pain so that we can determine how best to treat.   Sleep:   Getting a good night's sleep is vital to your health and well-being and can help prevent or manage health problems. Often, sleep can be improved by changing behaviors, including when you go to bed and what you do before bed. Sleep apnea can cause problems such as struggling to  stay awake during the day. Please let me know if you would like to learn more about improving your sleep and/or think you may have sleep apnea.   Seat Belt:   Please remember to wear a seat belt when driving or riding in a vehicle. It is one of the most important things you can do to stay safe in a car.   Nutrition:   Remember to eat plenty of fruits, vegetables, whole grains, and dairy. Drink at least 64 ounces (8 full glasses) of water a day, unless you have been advised to limit fluids.   Alcohol:   Alcohol can have a greater effect on older people, who may feel its effects at a lower amount. Older people should limit alcoholic drinks (no more than one a day for women and no more than two a day for men). Please let me know if alcohol use becomes a problem.   Tobacco:   Not smoking or using other forms of tobacco is one of the most important things you can do for your health. Here is some more information about the importance of quitting smoking and how to quit smoking - SaltLakeCityStreetMaps.no  Advance Directives:   There may come a time when medical decisions need to be made on your behalf. Please talk  with your family, and with me, about your wishes. It is important to provide information about your decisions, and any formal advance directives, for your medical record. Here is additional information on advanced directives - BlindCheck.com.ee.html  Additional Support:   Sometimes it can be challenging to manage all aspects of daily life. Finding the right support can help you maintain or improve your health and independence. Please let me know if you would like to talk further about finding resources to assist you.

## 2021-04-12 ENCOUNTER — Other Ambulatory Visit (INDEPENDENT_AMBULATORY_CARE_PROVIDER_SITE_OTHER): Payer: Self-pay | Admitting: Internal Medicine

## 2021-04-29 DIAGNOSIS — I10 Essential (primary) hypertension: Secondary | ICD-10-CM

## 2021-04-29 HISTORY — DX: Essential (primary) hypertension: I10

## 2021-05-09 ENCOUNTER — Encounter (INDEPENDENT_AMBULATORY_CARE_PROVIDER_SITE_OTHER): Payer: Self-pay | Admitting: Internal Medicine

## 2021-05-09 DIAGNOSIS — R911 Solitary pulmonary nodule: Secondary | ICD-10-CM | POA: Insufficient documentation

## 2021-05-09 DIAGNOSIS — K760 Fatty (change of) liver, not elsewhere classified: Secondary | ICD-10-CM | POA: Insufficient documentation

## 2021-05-09 DIAGNOSIS — I700 Atherosclerosis of aorta: Secondary | ICD-10-CM | POA: Insufficient documentation

## 2021-05-09 DIAGNOSIS — K8020 Calculus of gallbladder without cholecystitis without obstruction: Secondary | ICD-10-CM | POA: Insufficient documentation

## 2021-06-13 ENCOUNTER — Encounter (INDEPENDENT_AMBULATORY_CARE_PROVIDER_SITE_OTHER): Payer: Self-pay | Admitting: Physician Assistant

## 2021-06-13 ENCOUNTER — Ambulatory Visit (FREE_STANDING_LABORATORY_FACILITY): Payer: Medicare Other | Admitting: Physician Assistant

## 2021-06-13 VITALS — BP 134/76 | HR 81 | Temp 97.2°F | Resp 18 | Ht 65.0 in | Wt 160.0 lb

## 2021-06-13 DIAGNOSIS — N390 Urinary tract infection, site not specified: Secondary | ICD-10-CM

## 2021-06-13 DIAGNOSIS — R351 Nocturia: Secondary | ICD-10-CM

## 2021-06-13 DIAGNOSIS — N3000 Acute cystitis without hematuria: Secondary | ICD-10-CM

## 2021-06-13 DIAGNOSIS — N3592 Unspecified urethral stricture, female: Secondary | ICD-10-CM

## 2021-06-13 MED ORDER — NITROFURANTOIN MONOHYD MACRO 100 MG PO CAPS
100.0000 mg | ORAL_CAPSULE | Freq: Two times a day (BID) | ORAL | 0 refills | Status: DC
Start: 2021-06-13 — End: 2021-11-04

## 2021-06-13 NOTE — Progress Notes (Unsigned)
[facility] INTERNAL MEDICINE - [facility] [place] [facility]    Date: 06/13/2021 3:47 PM   Patient ID: Aimee Wade is a 75 y.o. female.    Subjective:     Chief Complaint:  Chief Complaint   Patient presents with    Urinary Tract Infection Symptoms     Burning and frequent urination since 24 hrs       HPI:  HPI  Dysuria, frequency without hematuria without fever, flank pain or vomiting. No vaginal discharge or irritation.    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  The dilation seemed to help her frequency and nocturia for a while. She had been on suppressive antibiotic (Trimethoprim 100mg  once daily) since. She ran out in September and has been recommended back to Uro or Urogyn for eval as she is still new to this area.        Problem List:  Patient Active Problem List   Diagnosis    History of right breast cancer    History of melanoma    History of sebaceous gland carcinoma    Osteoporosis without current pathological fracture, unspecified osteoporosis type    Lymphocytic colitis    Desmoid tumor of abdomen    History of left nephrectomy    Recurrent UTI    Cholelithiasis    Hepatic steatosis    Aortic atherosclerosis    Right middle lobe pulmonary nodule       Current Medications:  Outpatient Medications Marked as Taking for the 06/13/21 encounter (Office Visit) with Phil Dopp, PA   Medication Sig Dispense Refill    Acetaminophen (Tylenol) 325 MG Cap Take by mouth      Alpha-D-Galactosidase (Beano) Tab Take by mouth  0    Apoaequorin (Prevagen) 10 MG Cap Take by mouth      Clobetasol Propionate 0.025 % Cream Apply topically      famotidine (PEPCID) 20 MG tablet Take 1 tablet (20 mg total) by mouth daily      loratadine (CLARITIN) 10 MG tablet Take 1 tablet (10 mg total) by mouth daily 30 tablet 11    Melatonin 1 MG Cap Take by mouth  0     Multiple Vitamin (multivitamin) capsule Take 1 capsule by mouth daily      risedronate (ACTONEL) 150 MG tablet Take 1 tablet (150 mg total) by mouth every 30 (thirty) days      triamcinolone (KENALOG) 0.1 % cream Apply topically 2 (two) times daily Apply to affected area 2 times daily      Vitamin D3 (CHOLECALCIFEROL) 50 MCG (2000 UT) tablet Take 1 tablet (50 mcg total) by mouth daily           Allergies:  Allergies   Allergen Reactions    Codeine     Morphine Nausea Only    Sesame Oil     Soybean-Containing Drug Products        Past Medical History:  Past Medical History:   Diagnosis Date    Desmoid tumor of abdomen 1975    s/p left nephrectomy, radiation at [facility]    Foot fracture, left     History of melanoma     right thigh    History of right breast cancer     History of sebaceous gland carcinoma     left thigh    Lymphocytic  colitis     Osteoporosis        Past Surgical History:  Past Surgical History:   Procedure Laterality Date    BREAST IMPLANT Right     BUNIONECTOMY      HYSTERECTOMY  1979    MASTECTOMY Right     1996    NEPHRECTOMY Left 1979    desmoid tumor    TONSILLECTOMY  1957       Family History:  Family History   Problem Relation Age of Onset    Thyroid disease Mother     Macular degeneration Mother     Dementia Mother     Arthritis Mother     Clotting disorder Mother     Glaucoma Father     Macular degeneration Father     Hypertension Father     Ovarian cancer Sister 38    Colon cancer Maternal Grandmother 53    Breast cancer Paternal Grandmother     Valvular heart disease Son     Valve Surgery Son        Social History:  Social History     Tobacco Use    Smoking status: Never    Smokeless tobacco: Never   Substance Use Topics    Alcohol use: Not Currently    Drug use: Never          The following sections were reviewed this encounter by the provider:   Tobacco  Allergies  Meds  Problems  Med Hx  Surg Hx  Fam Hx         ROS:  Review of Systems    General/Constitutional:   Denies Chills.  Denies Fever.   GU:  See HPI.          Objective:     Vitals:  BP 134/76 (BP Site: Left arm, Patient Position: Sitting, Cuff Size: Medium)   Pulse 81   Temp 97.2 F (36.2 C) (Tympanic)   Resp 18   Ht 1.651 m (5\' 5" )   Wt 72.6 kg (160 lb)   SpO2 97%   BMI 26.63 kg/m     Physical Exam:    General Examination:   GENERAL APPEARANCE: alert, in no acute distress, well developed, well nourished, oriented to time, place, and person.   ABDOMEN:  mild suprapubic tenderness, soft, no organomegaly, no rebound or guarding, no CVAT to percussion bilaterally  SKIN: no rash  Physical Exam         Assessment/Plan:       1. Acute cystitis without hematuria  - nitrofurantoin, macrocrystal-monohydrate, (MACROBID) 100 MG capsule; Take 1 capsule (100 mg) by mouth 2 (two) times daily for 5 days  Dispense: 10 capsule; Refill: 0  - Urine culture  Orders Placed This Encounter   Procedures    Urine culture     Order Specific Question:   Release to patient     Answer:   Immediate         Dysuria and frequency, culture pending. No or low risk for STI.      Empiric treatment started, given supportive care instructions.     Patient will notify us if not better in 48 hours or if worsening, we will call with treatment changes if needed after culture completed.    Return if symptoms worsen or fail to improve.    [facility] PA-C  [address]  [address]  [place], [place] [address]  P [phone]  F [phone]

## 2021-06-13 NOTE — Patient Instructions (Signed)
UTI (Bladder OR Kidney Infection) :    MAKE EVERY EFFORT TO drink 2 liters water per day in small freq while ill.    Take your ANTIBIOTIC until gone.    You can use OTC "AZO"   as needed for bladder irritation for 1-2 days.     We will call you if your urine culture results indicate that you need to be on a different antibiotic.  IF URINE CULTURE grows typical germ, the result will be sent to your [facility] CHART application.    Otherwise, CALL or RETURN if getting worse such as fever, chills, back pain or vomiting, OR if just not getting better in 48-72 hours.          For Urology or Urogynecology:    We suggest these Urogynecology offices in our area:    [facility] [employer]  [address]  [address]  [place], [place] [address]  tel: [phone]  fax: [phone]    [facility]  Dr. Herminio Heads  [address]  [place] [address]  [phone]  [email or web address]    [facility]  [facility]  [address]  [address]  [place], [place] [address]  Get Directions  Tel: [phone]  Fax: [phone]    [facility]  [address], [address]  [place], [place] [address]  Tel: [phone]  Fax: [phone]  E-mail: [email or web address]    Or    [phone], [place] [address]  Tel: [phone]  Fax: [phone]  E-mail: [email or web address]        We suggest these Urology offices in our area:     [facility]   [address]  [place], [place] [address]  Phone: [phone]      [facility]  Dr. Scot Jun & Dr. Riley Churches, et al  [address]    [place], [place] [address]  [phone]    Also    [facility]  Dr. Carlis Abbott  [address]  [address]  [place], [place] [address]  [phone]      [facility]  [address]  [place], [place] [address]  [phone]

## 2021-06-19 ENCOUNTER — Encounter (INDEPENDENT_AMBULATORY_CARE_PROVIDER_SITE_OTHER): Payer: Self-pay | Admitting: Physician Assistant

## 2021-06-19 DIAGNOSIS — N3592 Unspecified urethral stricture, female: Secondary | ICD-10-CM | POA: Insufficient documentation

## 2021-06-19 DIAGNOSIS — L438 Other lichen planus: Secondary | ICD-10-CM

## 2021-06-19 DIAGNOSIS — R351 Nocturia: Secondary | ICD-10-CM

## 2021-06-19 HISTORY — DX: Unspecified urethral stricture, female: N35.92

## 2021-06-19 HISTORY — DX: Nocturia: R35.1

## 2021-06-19 HISTORY — DX: Other lichen planus: L43.8

## 2021-07-01 ENCOUNTER — Encounter (INDEPENDENT_AMBULATORY_CARE_PROVIDER_SITE_OTHER): Payer: Self-pay

## 2021-07-01 ENCOUNTER — Ambulatory Visit (INDEPENDENT_AMBULATORY_CARE_PROVIDER_SITE_OTHER): Payer: Medicare Other

## 2021-07-01 VITALS — BP 165/89 | HR 77 | Wt 160.0 lb

## 2021-07-01 DIAGNOSIS — R3915 Urgency of urination: Secondary | ICD-10-CM

## 2021-07-01 DIAGNOSIS — N952 Postmenopausal atrophic vaginitis: Secondary | ICD-10-CM

## 2021-07-01 DIAGNOSIS — N393 Stress incontinence (female) (male): Secondary | ICD-10-CM

## 2021-07-01 MED ORDER — ESTRADIOL 0.1 MG/GM VA CREA
TOPICAL_CREAM | VAGINAL | 3 refills | Status: AC
Start: 2021-07-01 — End: 2021-07-29

## 2021-07-01 NOTE — Progress Notes (Signed)
Subjective:       Patient ID: Aimee Wade is a 76 y.o. G2P2 female who presents today for recurring UTIs. She reports that her most recent UTI was on the 16th of December and after completing a course of antibiotics the symptoms came back on the 25th. She has a previous diagnosis of lichens sclerosis which she has been on maintenance clobetasol for with no significant improvements or changes, has never had a biopsy that she can remember. Was on premarin cream in the past but stopped taking it because she had breast cancer and was having painful intercourse but noted that it had helped with some of her symptoms. Reports that she wears a depends during the day because of the leaking that she has when she coughs, laughs, or sneezes or her inability to get to the bathroom in time. Reports that she wakes up every two hours to use the restroom during the night. Not sexually active at this time. Has a daily bowel movement. Had a urethral dilation in 2021 to help with frequency symptoms. Has a history of a hysterectomy for a former abdominal tumor and has a hernia.    The following portions of the patient's history were reviewed and updated as appropriate: allergies, current medications, past family history, past medical history, past social history, past surgical history, and problem list.    Review of Systems   Genitourinary:  Positive for dyspareunia, dysuria, frequency, urgency and vaginal pain.   All other systems reviewed and are negative.  Question 06/28/2021 11:23 AM EST - Filed by Patient   Physical Activity    On average, how many days per week do you engage in moderate to strenuous exercise (like a brisk walk)? 0 days   On average, how many minutes do you engage in exercise at this level? 0 min   Financial Needs    How hard is it for you to pay for the very basics like food, housing, medical care, and heating? Patient refused   Housing Needs    In the last 12 months, was there a time when you were not able to  pay the mortgage or rent on time? No   In the last 12 months, how many places have you lived? (range: at least 0) 1   In the last 12 months, was there a time when you did not have a steady place to sleep or slept in a shelter (including now)? No   Transportation Needs    In the past 12 months, has lack of transportation kept you from medical appointments or from getting medications? No   In the past 12 months, has lack of transportation kept you from meetings, work, or from getting things needed for daily living? No   Food Insecurity       Within the past 12 months, you worried that your food would run out before you got the money to buy more. Never true   Within the past 12 months, the food you bought just didn't last and you didn't have money to get more. Never true   Stress    Do you feel stress - tense, restless, nervous, or anxious, or unable to sleep at night because your mind is troubled all the time - these days? Only a little   Social Connections    In a typical week, how many times do you talk on the phone with family, friends, or neighbors? Twice a week   How often do you get together  with friends or relatives? Once a week   How often do you attend church or religious services? More than 4 times per year   Do you belong to any clubs or organizations such as church groups, unions, fraternal or athletic groups, or school groups? Yes   How often do you attend meetings of the clubs or organizations you belong to? More than 4 times per year   Are you married, widowed, divorced, separated, never married, or living with a partner? Married   Intimate Partner Violence       Within the last year, have you been afraid of your partner or ex-partner? No   Within the last year, have you been humiliated or emotionally abused in other ways by your partner or ex-partner? No   Within the last year, have you been kicked, hit, slapped, or otherwise physically hurt by your partner or ex-partner? No   Within the last year,  have you been raped or forced to have any kind of sexual activity by your partner or ex-partner? No   Alcohol Use    How often do you have a drink containing alcohol? Never   How many drinks containing alcohol do you have on a typical day when you are drinking? Patient does not drink   How often do you have six or more drinks on one occasion? Never         Objective:    Physical Exam  Vitals reviewed. Exam conducted with a chaperone present.   Constitutional:       Appearance: Normal appearance. She is normal weight.   HENT:      Head: Normocephalic.      Nose: Nose normal.      Mouth/Throat:      Mouth: Mucous membranes are moist.   Eyes:      Pupils: Pupils are equal, round, and reactive to light.   Cardiovascular:      Rate and Rhythm: Normal rate.   Pulmonary:      Effort: Pulmonary effort is normal.   Abdominal:      General: Abdomen is flat.      Palpations: Abdomen is soft.   Genitourinary:     Labia:         Right: Tenderness and lesion present.         Left: Tenderness present.       Urethra: Urethral pain present.      Rectum: Normal.   Musculoskeletal:         General: Normal range of motion.      Cervical back: Normal range of motion.   Skin:     General: Skin is warm.   Neurological:      General: No focal deficit present.      Mental Status: She is alert and oriented to person, place, and time. Mental status is at baseline.   Psychiatric:         Mood and Affect: Mood normal.         Behavior: Behavior normal.         Thought Content: Thought content normal.         Judgment: Judgment normal.      07/01/2021 10:51 AM 07/01/2021 10:52 AM   BP 159/83 165/89   BP Site Left arm Left arm   Patient Position Sitting Sitting   Cuff Size Large Large   Pulse 88 77   Weight 160 lb (72.6 kg)      Current Outpatient Medications  on File Prior to Visit   Medication Sig Dispense Refill    Acetaminophen (Tylenol) 325 MG Cap Take by mouth      Alpha-D-Galactosidase (Beano) Tab Take by mouth  0    Apoaequorin (Prevagen) 10  MG Cap Take by mouth      Clobetasol Propionate 0.025 % Cream Apply topically      famotidine (PEPCID) 20 MG tablet Take 1 tablet (20 mg total) by mouth daily      loratadine (CLARITIN) 10 MG tablet Take 1 tablet (10 mg total) by mouth daily 30 tablet 11    Melatonin 1 MG Cap Take by mouth  0    Multiple Vitamin (multivitamin) capsule Take 1 capsule by mouth daily      risedronate (ACTONEL) 150 MG tablet Take 1 tablet (150 mg total) by mouth every 30 (thirty) days      triamcinolone (KENALOG) 0.1 % cream Apply topically 2 (two) times daily Apply to affected area 2 times daily      trimethoprim (TRIMPEX) 100 MG tablet  (Patient not taking: Reported on 06/13/2021)      Vitamin D3 (CHOLECALCIFEROL) 50 MCG (2000 UT) tablet Take 1 tablet (50 mcg total) by mouth daily       No current facility-administered medications on file prior to visit.     Physical Exam  Pelvic floor examination (PFE):     Yes  Vulva Normal:         No   Vulva / Labia tenderness  No   Inner thigh tenderness:        Yes  Bartholin's Glands Normal             Yes  Urethra Normal   Yes  Normal Bladder Neck         No   Stress incontinence demonstrated  No   Bladder tenderness present            Significant\ Vaginal Atrophy  UTA Normal vaginal capacity:          UTA Normal vaginal mobility:           UTA  Normal vaginal discharge:        Unable to perform full pelvic exam at this time d/t patient pain.    Assessment:   1. Vaginal atrophy  - estradiol (ESTRACE) 0.1 MG/GM vaginal cream; Place 1 g vaginally daily for 14 days, THEN 1 g Once every Monday, Wednesday, and Friday evening for 14 days. Place 1g vaginally daily for 14 days followed by 1g every Monday/Wednesday/Friday evening thereafter..  Dispense: 42.5 g; Refill: 3    2. Urinary urgency    3. SUI (stress urinary incontinence, female)    Plan:   Unable to complete exam for patient. Plan to start on vaginal estrogen cream to help heal the tissue and decrease pain and burning. Will do 1g of estrace  every night for 2 weeks and then switch to Mon/Wed/Friday night thereafter.   Plan for patient to return to clinic in 2 weeks to monitor symptoms of atrophy and hopefully be able to perform vaginal exam at that time.   Follow up sooner should any new symptoms or issues arise.     I spent 30 minutes with this new patient, >50% spent on face to face counseling and care coordination.   PFSH: Patients current medications were reviewed.  The ROS included at least 2-9 systems  The exam included at least 5-7 body systems  The medical decision making was  low and included 1 stable chronic or uncomplicated acute illness with systematic symptoms and/or OTC drug management or PT.     Laurieanne Galloway E. Vasilios Ottaway MSN, FNP-C  [facility]  [address]  [place], [place]   Office: [phone]  Fax:[phone]  (She/Her)

## 2021-07-11 NOTE — Progress Notes (Signed)
Chart Review Note   I have reviewed the history and physical exam and findings, and agree with the documented plan.  Concha Norway, MD    BP Readings from Last 3 Encounters:   07/01/21 165/89   06/13/21 134/76   03/28/21 128/80     The 10-year ASCVD risk score (Arnett DK, et al., 2019) is: 19.6%    Values used to calculate the score:      Age: 76 years      Sex: Female      Is Non-Hispanic African American: No      Diabetic: No      Tobacco smoker: No      Systolic Blood Pressure: 165 mmHg      Is BP treated: No      HDL Cholesterol: 72 mg/dL      Total Cholesterol: 168 mg/dL

## 2021-07-15 ENCOUNTER — Encounter (INDEPENDENT_AMBULATORY_CARE_PROVIDER_SITE_OTHER): Payer: Self-pay | Admitting: Internal Medicine

## 2021-07-15 ENCOUNTER — Ambulatory Visit (INDEPENDENT_AMBULATORY_CARE_PROVIDER_SITE_OTHER): Payer: Medicare Other

## 2021-07-15 VITALS — BP 158/67 | HR 91 | Wt 161.0 lb

## 2021-07-15 DIAGNOSIS — R3915 Urgency of urination: Secondary | ICD-10-CM

## 2021-07-15 DIAGNOSIS — N952 Postmenopausal atrophic vaginitis: Secondary | ICD-10-CM

## 2021-07-15 DIAGNOSIS — N393 Stress incontinence (female) (male): Secondary | ICD-10-CM

## 2021-07-15 NOTE — Progress Notes (Signed)
Subjective:       Patient ID: Aimee Wade is a 76 y.o. G2P2 female who presents today for follow up of her vaginal atrophy. During her previous appointment she was unable to tolerate an exam. Started patient on vaginal estrogen to use nightly for 2 weeks and to return for an exam. She has not had symptoms of a UTI since starting the vaginal estrogen. Continues to have some leaking when she coughs, laughs, and sneezes and with urgency. Denies any new symptoms or issues at this time.     The following portions of the patient's history were reviewed and updated as appropriate: allergies, current medications, past family history, past medical history, past social history, past surgical history, and problem list.    Review of Systems   Genitourinary:  Positive for urgency.   All other systems reviewed and are negative.   Objective:    Physical Exam  Vitals reviewed. Exam conducted with a chaperone present.   Constitutional:       Appearance: Normal appearance. She is normal weight.   HENT:      Head: Normocephalic.      Nose: Nose normal.      Mouth/Throat:      Mouth: Mucous membranes are moist.   Eyes:      Pupils: Pupils are equal, round, and reactive to light.   Cardiovascular:      Rate and Rhythm: Normal rate.   Pulmonary:      Effort: Pulmonary effort is normal.   Abdominal:      Palpations: Abdomen is soft.   Genitourinary:     General: Normal vulva.      Rectum: Normal.   Musculoskeletal:      Cervical back: Normal range of motion.   Skin:     General: Skin is warm.   Neurological:      General: No focal deficit present.      Mental Status: She is alert and oriented to person, place, and time. Mental status is at baseline.   Psychiatric:         Mood and Affect: Mood normal.         Behavior: Behavior normal.         Thought Content: Thought content normal.         Judgment: Judgment normal.      07/15/2021 9:19 AM   BP 158/67   BP Site Left arm   Patient Position Sitting   Cuff Size Large   Pulse 91   Weight  161 lb (73 kg)     Current Outpatient Medications on File Prior to Visit   Medication Sig Dispense Refill    Acetaminophen (Tylenol) 325 MG Cap Take by mouth      Alpha-D-Galactosidase (Beano) Tab Take by mouth  0    Apoaequorin (Prevagen) 10 MG Cap Take by mouth      Clobetasol Propionate 0.025 % Cream Apply topically      estradiol (ESTRACE) 0.1 MG/GM vaginal cream Place 1 g vaginally daily for 14 days, THEN 1 g Once every Monday, Wednesday, and Friday evening for 14 days. Place 1g vaginally daily for 14 days followed by 1g every Monday/Wednesday/Friday evening thereafter.. 42.5 g 3    famotidine (PEPCID) 20 MG tablet Take 1 tablet (20 mg total) by mouth daily      loratadine (CLARITIN) 10 MG tablet Take 1 tablet (10 mg total) by mouth daily 30 tablet 11    Melatonin 1 MG Cap Take by  mouth  0    Multiple Vitamin (multivitamin) capsule Take 1 capsule by mouth daily      risedronate (ACTONEL) 150 MG tablet Take 1 tablet (150 mg total) by mouth every 30 (thirty) days      triamcinolone (KENALOG) 0.1 % cream Apply topically 2 (two) times daily Apply to affected area 2 times daily      trimethoprim (TRIMPEX) 100 MG tablet       Vitamin D3 (CHOLECALCIFEROL) 50 MCG (2000 UT) tablet Take 1 tablet (50 mcg total) by mouth daily       No current facility-administered medications on file prior to visit.     Physical Exam  Pelvic floor examination (PFE):     Yes  Vulva Normal:         No   Vulva / Labia tenderness  No   Inner thigh tenderness:        Yes  Bartholin's Glands Normal             Yes  Urethra Normal   Yes  Normal Bladder Neck         No   Stress incontinence demonstrated  No   Bladder tenderness present            Improving Vaginal Atrophy  Yes  Normal vaginal capacity-on bimanual exam noted a small amount of stenosis on the patient's left side           Yes  Normal vaginal mobility:           Yes  Normal vaginal discharge:      N/a  Normal Cervix-hx of hysterectomy          Yes  Normal Adnexal Exam  No    Involuntary pelvic floor contraction  No   Involuntary pelvic floor relaxation       PELVIC ORGAN PROLAPSE QUANTIFICATION (Valsalva)  -3 Aa  (anterior wall 3 cm from hymen)   (-3 to +3)      -3 Ba  (most dependent part of rest of anterior wall (-3 to TVL)   -6 C   (cervix or vaginal cuff)     (TVL)         N/a D   (posterior fornix - if no hysterectomy)   (TVL)         -3 Ap (Posterior wall 3 cm from hymen)   (-3 to +3)      -3 Bp (most dependent part of rest of posterior wall) (-3 to TVL)  3 GH (genital hiatus-midurethra to PB)   (no limit)  3 PB (Perineal body - PB to mid anus)   (no limit)       7 TVL (total vaginal length non straining)  (no limit)       no  Perinium Protrussion/Descent        No PVR completed d/t pt discomfort.   Assessment:   1. Vaginal atrophy    2. Urinary urgency  - Ambulatory referral to Physical Therapy    3. SUI (stress urinary incontinence, female)  - Ambulatory referral to Physical Therapy    Plan:   Plan to continue vaginal estrogen cream 3 times per week at night time. Vaginal exam was able to be completed but still had some discomfort at the labia. Encouraged patient that she could place vaginal estrogen cream at the opening to help decrease any discomfort.   Referral to PFPT given to patient to help with SUI/UUI and strengthen muscles in PF/around  urethra.   Plan to follow up after 6-8 sessions of PFPT to monitor incontinence symptoms. Standing order for urine culture placed for patient if she has symptoms of a UTI in the future.   Follow up sooner should any new symptoms or issues arise.     I spent 30 minutes with this returning patient, >50% spent on face to face counseling and care coordination.   PFSH: Patients current medications were reviewed.  The ROS included at least 2-9 systems  The exam included at least 5-7 body systems  The medical decision making was low and included 1 stable chronic or uncomplicated acute illness with systematic symptoms and/or OTC drug management  or PT.     Darrol Brandenburg E. Demetri Kerman MSN, FNP-C  [facility]  [address]  [place], [place]   Office: [phone]  Fax:[phone]  (She/Her)

## 2021-07-17 ENCOUNTER — Ambulatory Visit (INDEPENDENT_AMBULATORY_CARE_PROVIDER_SITE_OTHER): Payer: Medicare Other | Admitting: Internal Medicine

## 2021-07-17 ENCOUNTER — Encounter (INDEPENDENT_AMBULATORY_CARE_PROVIDER_SITE_OTHER): Payer: Self-pay | Admitting: Internal Medicine

## 2021-07-17 VITALS — BP 180/110 | HR 82 | Temp 96.0°F | Ht 65.0 in | Wt 161.0 lb

## 2021-07-17 DIAGNOSIS — I700 Atherosclerosis of aorta: Secondary | ICD-10-CM

## 2021-07-17 DIAGNOSIS — I10 Essential (primary) hypertension: Secondary | ICD-10-CM | POA: Insufficient documentation

## 2021-07-17 LAB — MICROALBUMIN, RANDOM URINE
Urine Creatinine, Random: 95.9 mg/dL
Urine Microalbumin, Random: <5 ug/ml (ref 0.0–30.0)

## 2021-07-17 MED ORDER — LOSARTAN POTASSIUM 50 MG PO TABS
50.0000 mg | ORAL_TABLET | Freq: Every day | ORAL | 1 refills | Status: DC
Start: 2021-07-17 — End: 2021-11-18

## 2021-07-17 MED ORDER — ATORVASTATIN CALCIUM 10 MG PO TABS
10.0000 mg | ORAL_TABLET | Freq: Every day | ORAL | 3 refills | Status: DC
Start: 2021-07-17 — End: 2022-03-31

## 2021-07-17 NOTE — Progress Notes (Signed)
Subjective:      Patient ID: Aimee Wade is a 76 y.o. female     Chief Complaint:  Chief Complaint   Patient presents with    Discuss meds and possible blood work     HPI:  07/17/21:  Had UTI in December, saw urogyn, no infection however vaginal atrophy, using vaginal estrogen.  Blood pressure was high, asymptomatic.  Aortic atherosclerosis noted on previous CT scan.  Walks 15 min with her 2 yorkies.  Going to [place] in Feb, long flight.    03/28/21:  Overall feeling well.  Previous PCP Dr. Vernie Murders in [facility], moved Nov 2021 to be closer to granddaughters.  Had right breast cancer s/p surgery.  History of desmoid tumor s/p left nephrectomy, radiation.  Follows gastroenterology, get colonoscopy every 5 years, previously on mesalamine.  History of recurrent UTI, wishes to follow up with urology.  Uses clobetasol cream vaginally.  Lives with husband, retired Runner, broadcasting/film/video.    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 Place  1g vaginally daily for 14 days followed by 1g every Monday/Wednesday/Friday evening thereafter.. 42.5 g 3    famotidine (PEPCID) 20 MG tablet Take 1 tablet (20 mg total) by mouth daily      loratadine (CLARITIN) 10 MG tablet Take 10 mg by mouth as needed 30 tablet 11    Melatonin 1 MG Cap Take by mouth as needed  0    Multiple Vitamin (multivitamin) capsule Take 1 capsule by mouth daily      risedronate (ACTONEL) 150 MG tablet Take 1 tablet (150 mg total) by mouth every 30 (thirty) days      triamcinolone (KENALOG) 0.1 % cream Apply topically 2 (two) times daily Apply to affected area 2 times daily      Vitamin D3 (CHOLECALCIFEROL) 50 MCG (2000 UT) tablet Take 1 tablet (50 mcg total) by mouth daily       No current facility-administered medications on file prior to visit.     Allergies:  Allergies   Allergen Reactions    Codeine     Morphine Nausea Only    Sesame Oil     Soybean-Containing Drug Products      Vitals:  Vitals:    07/17/21 0928 07/17/21  1006   BP: 142/80 (!) 180/110   BP Site: Left arm    Patient Position: Sitting    Cuff Size: Medium    Pulse: 82    Temp: (!) 96 F (35.6 C)    TempSrc: Tympanic    Weight: 73 kg (161 lb)    Height: 1.651 m (5\' 5" )      BP Readings from Last 3 Encounters:   07/17/21 (!) 180/110   07/15/21 158/67   07/01/21 165/89     Wt Readings from Last 3 Encounters:   07/17/21 73 kg (161 lb)   07/15/21 73 kg (161 lb)   07/01/21 72.6 kg (160 lb)     Body mass index is 26.79 kg/m.     Objective:     Physical Exam:  Physical Exam  Constitutional:       General: She is not in acute distress.     Appearance: She is not ill-appearing.   HENT:      Head: Normocephalic.   Cardiovascular:      Rate and Rhythm: Normal rate and regular rhythm.      Heart sounds: Normal heart sounds.   Pulmonary:      Effort: Pulmonary effort is normal.   Neurological:      Mental Status: She is alert.   Psychiatric:         Behavior: Behavior normal.     Assessment:     The 10-year ASCVD risk score (Arnett  DK, et al., 2019) is: 30%    Values used to calculate the score:      Age: 63 years      Sex: Female      Is Non-Hispanic African American: No      Diabetic: No      Tobacco smoker: No      Systolic Blood Pressure: 180 mmHg      Is BP treated: Yes      HDL Cholesterol: 72 mg/dL      Total Cholesterol: 168 mg/dL    Aimee Wade was seen today for discuss meds and possible blood work.    Diagnoses and all orders for this visit:    Essential hypertension  -     Urine Microalbumin Random    Aortic atherosclerosis    Other orders  -     losartan (COZAAR) 50 MG tablet; Take 1 tablet (50 mg) by mouth daily  -     atorvastatin (LIPITOR) 10 MG tablet; Take 1 tablet (10 mg) by mouth daily      Plan:     Hypertension: Recommend more plant based, low salt diet.  Recommend checking home blood pressures.  Can start losartan 50 mg daily, may uptitrate as needed.  Will check urine microalbumin.  Monitor renal function especially with solitary kidney, can repeat at next visit after starting losartan  Aortic atherosclerosis: Noted on previous CT scan.  Can start atorvastatin 10 mg daily  Recommend compression stockings, leg stretching for long flight    Return in about 1 month (around 08/17/2021) for hypertension.    Concha Norway, MD

## 2021-07-30 ENCOUNTER — Encounter (FREE_STANDING_LABORATORY_FACILITY): Payer: Medicare Other

## 2021-07-30 ENCOUNTER — Telehealth (INDEPENDENT_AMBULATORY_CARE_PROVIDER_SITE_OTHER): Payer: Self-pay | Admitting: Female Pelvic Medicine and Reconstructive Surgery

## 2021-07-30 DIAGNOSIS — N3000 Acute cystitis without hematuria: Secondary | ICD-10-CM

## 2021-07-30 DIAGNOSIS — N393 Stress incontinence (female) (male): Secondary | ICD-10-CM

## 2021-07-30 DIAGNOSIS — R3915 Urgency of urination: Secondary | ICD-10-CM

## 2021-07-30 NOTE — Telephone Encounter (Signed)
TC from patient experiencing UTI symptoms of pressure, heaviness, and frequency and urgency. Patient taking AZO as needed. Informed patient to leave a urine sample in the office and UA came back positive for UTI. Printed out 1/3 standing urine culture to send to lab for processing and explain to patient results usually come within 3-5 business days.     Encouraged patient to continue using vaginal estrogen cream as that can also be preventative of UTIs while we await lab results. Patient verbalized understanding, wants to know if she can be treated empirically or continue taking AZO and vaginal estrogen cream. Please advise.

## 2021-07-31 MED ORDER — SULFAMETHOXAZOLE-TRIMETHOPRIM 800-160 MG PO TABS
1.0000 | ORAL_TABLET | Freq: Two times a day (BID) | ORAL | 0 refills | Status: AC
Start: 2021-07-31 — End: 2021-08-03

## 2021-07-31 NOTE — Telephone Encounter (Signed)
Please, let patient know that based on her last UC results I send antibiotics to her pharmacy. When the UC is resulted, if the antibiotics is not the correct one, we will send a new prescription and reach out to her. Thank you.

## 2021-08-15 ENCOUNTER — Encounter (INDEPENDENT_AMBULATORY_CARE_PROVIDER_SITE_OTHER): Payer: Self-pay | Admitting: Internal Medicine

## 2021-08-15 ENCOUNTER — Ambulatory Visit (FREE_STANDING_LABORATORY_FACILITY): Payer: Medicare Other | Admitting: Internal Medicine

## 2021-08-15 VITALS — BP 132/78 | HR 70 | Temp 97.8°F | Ht 65.0 in | Wt 161.0 lb

## 2021-08-15 DIAGNOSIS — I10 Essential (primary) hypertension: Secondary | ICD-10-CM

## 2021-08-15 DIAGNOSIS — M25572 Pain in left ankle and joints of left foot: Secondary | ICD-10-CM

## 2021-08-15 LAB — BASIC METABOLIC PANEL
Anion Gap: 9 (ref 5.0–15.0)
BUN: 15 mg/dL (ref 7.0–21.0)
CO2: 26 mEq/L (ref 17–29)
Calcium: 9.3 mg/dL (ref 7.9–10.2)
Chloride: 104 mEq/L (ref 99–111)
Creatinine: 0.8 mg/dL (ref 0.4–1.0)
Glucose: 98 mg/dL (ref 70–100)
Potassium: 4.3 mEq/L (ref 3.5–5.3)
Sodium: 139 mEq/L (ref 135–145)

## 2021-08-15 LAB — GFR: EGFR: >60

## 2021-08-15 LAB — HEMOLYSIS INDEX: Hemolysis Index: 5 Index (ref 0–24)

## 2021-08-15 MED ORDER — RISEDRONATE SODIUM 150 MG PO TABS
150.0000 mg | ORAL_TABLET | ORAL | 3 refills | Status: DC
Start: 2021-08-15 — End: 2022-08-28

## 2021-08-15 NOTE — Progress Notes (Signed)
Subjective:      Patient ID: Aimee Wade is a 76 y.o. female     Chief Complaint:  Chief Complaint   Patient presents with    Follow-up    Medication Refill    Ankle Pain     Left ankle     HPI:  Aimee Wade is a 76 y.o. female with history significant for HTN, aortic atherosclerosis, and L nephrectomy presents for 1 month follow up for elevated BP. Pt reports variable BP at home, averaging in 130s/75s, some readings of 150/90. Reports compliant with losartan 50mg  and daily medications. Denies CP, SOB, light-headedness, radiating pain to shoulder or jaw, or worsening lower extremity edema.     Pt also notes 5/10 L medial ankle pain x 1 week, characterized as "tight band." Pain worse when walking. Icy hot ointment with little relief.     Pt denies fatigue, but feels "sluggish" in the morning. Notes regular 6 hrs of sleep, with increased urinary frequency every 2 hours in last year; however reports this is not new. Stays well hydrated d/t history of recurrent UTIs. Denies fevers.     Excited for trip to [place] next Tuesday.       Review of Systems   Constitutional:  Negative for fatigue and fever.        +Feels "sluggish"   Respiratory:  Negative for chest tightness, shortness of breath and wheezing.    Cardiovascular:  Negative for chest pain and palpitations.        +Elevated BP readings at home    Musculoskeletal:         +L ankle pain   Neurological:  Negative for light-headedness and headaches.   All other systems reviewed and are negative.  See HPI    Problem List:  Patient Active Problem List   Diagnosis    History of right breast cancer    History of melanoma    History of sebaceous gland carcinoma    Osteoporosis without current pathological fracture, unspecified osteoporosis type    Lymphocytic colitis    Desmoid tumor of abdomen    History of left nephrectomy    Recurrent UTI    Cholelithiasis    Hepatic steatosis    Aortic atherosclerosis    Right middle lobe pulmonary nodule    Stricture of female  urethra    Erosive lichen planus of vagina    Nocturia    Hypertension, unspecified type    Acute left ankle pain     Current Medications:  Current Outpatient Medications on File Prior to Visit   Medication Sig Dispense Refill    Acetaminophen (Tylenol) 325 MG Cap Take by mouth      Alpha-D-Galactosidase (Beano) Tab Take by mouth  0    Apoaequorin (Prevagen) 10 MG Cap Take by mouth      atorvastatin (LIPITOR) 10 MG tablet Take 1 tablet (10 mg) by mouth daily 90 tablet 3    Clobetasol Propionate 0.025 % Cream Apply topically      famotidine (PEPCID) 20 MG tablet Take 1 tablet (20 mg total) by mouth daily      losartan (COZAAR) 50 MG tablet Take 1 tablet (50 mg) by mouth daily 90 tablet 1    Multiple Vitamin (multivitamin) capsule Take 1 capsule by mouth daily      Vitamin D3 (CHOLECALCIFEROL) 50 MCG (2000 UT) tablet Take 1 tablet (50 mcg total) by mouth daily      loratadine (CLARITIN) 10 MG tablet Take 10  mg by mouth as needed 30 tablet 11    Melatonin 1 MG Cap Take by mouth as needed  0     No current facility-administered medications on file prior to visit.     Allergies:  Allergies   Allergen Reactions    Codeine     Morphine Nausea Only    Sesame Oil     Soybean-Containing Drug Products      Vitals:  Vitals:    08/15/21 0901 08/15/21 0915   BP: 148/80 132/78   BP Site: Left arm Left arm   Patient Position: Sitting    Cuff Size: Medium    Pulse: 70    Temp: 97.8 F (36.6 C)    TempSrc: Tympanic    SpO2: 97%    Weight: 73 kg (161 lb)    Height: 1.651 m (5\' 5" )      BP Readings from Last 3 Encounters:   08/15/21 132/78   07/17/21 (!) 180/110   07/15/21 158/67     Wt Readings from Last 3 Encounters:   08/15/21 73 kg (161 lb)   07/17/21 73 kg (161 lb)   07/15/21 73 kg (161 lb)     Body mass index is 26.79 kg/m.     Objective:     Physical Exam:  Physical Exam  Vitals reviewed.   Constitutional:       Appearance: Normal appearance.   HENT:      Head: Normocephalic.   Cardiovascular:      Heart sounds: Normal heart  sounds. No murmur heard.    No friction rub. No gallop.   Pulmonary:      Breath sounds: Normal breath sounds. No wheezing, rhonchi or rales.   Musculoskeletal:      Comments: +No TTP over anterior medial L ankle. Mild pain with plantar flexion.    Neurological:      Mental Status: She is alert.   Psychiatric:         Mood and Affect: Mood normal.      Assessment:     Aimee Wade was seen today for follow-up, medication refill and ankle pain.    Diagnoses and all orders for this visit:    Hypertension, unspecified type  -     Basic Metabolic Panel    Acute left ankle pain    Other orders  -     GFR  -     Hemolysis index  -     risedronate (ACTONEL) 150 MG tablet; Take 1 tablet (150 mg) by mouth every 30 (thirty) days        Plan:     HTN: Continue with losartan 50mg , being sure to take meds before returning for follow up in 3 months. Advised proper BP reading techniques at home, making sure blood cuff is not too tight. Continue with healthy diet and exercise.   L ankle pain: Advised adequate rest and take Tylenol as needed for pain. If sxs worsen, will consider PT.     Return in about 3 months (around 11/12/2021) for hypertension.    , Medical Student

## 2021-08-15 NOTE — Progress Notes (Signed)
Subjective:      Patient ID: Aimee Wade is a 76 y.o. female     Chief Complaint:  Chief Complaint   Patient presents with    Follow-up    Medication Refill    Ankle Pain     Left ankle     HPI:  08/15/21:  Overall feeling well.  Home blood pressure 130s/70s.  Denies chest pain, shortness of breath.  Left medial ankle pain, hurts with extending toes past week.  Tried topical IcyHot.  Requesting refill on monthly risedronate, reports has been on for 2 years.  Previously was on Prolia, reports then saw endocrinology and was started on risedronate 2020.  Follows gynecology Dr. Conley Wade.    07/17/21:  Had UTI in December, saw urogyn, no infection however vaginal atrophy, using vaginal estrogen.  Blood pressure was high, asymptomatic.  Aortic atherosclerosis noted on previous CT scan.  Walks 15 min with her 2 yorkies.  Going to [place] in Feb, long flight.    03/28/21:  Overall feeling well.  Previous PCP Dr. Vernie Wade in [facility], moved Nov 2021 to be closer to granddaughters.  Had right breast cancer s/p surgery.  History of desmoid tumor s/p left nephrectomy, radiation.  Follows gastroenterology, get colonoscopy every 5 years, previously on mesalamine.  History of recurrent UTI, wishes to follow up with urology.  Uses clobetasol cream vaginally.  Lives with husband, retired Runner, broadcasting/film/video.    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    Erosive lichen planus of vagina    Nocturia    Hypertension, unspecified type    Acute left ankle pain     Current Medications:  Current  Outpatient Medications on File Prior to Visit   Medication Sig Dispense Refill    Acetaminophen (Tylenol) 325 MG Cap Take by mouth      Alpha-D-Galactosidase (Beano) Tab Take by mouth  0    Apoaequorin (Prevagen) 10 MG Cap Take by mouth      atorvastatin (LIPITOR) 10 MG tablet Take 1 tablet (10 mg) by mouth daily 90 tablet 3    Clobetasol Propionate 0.025 % Cream Apply topically      famotidine (PEPCID) 20 MG tablet Take 1 tablet (20 mg total) by mouth daily      losartan (COZAAR) 50 MG tablet Take 1 tablet (50 mg) by mouth daily 90 tablet 1    Multiple Vitamin (multivitamin) capsule Take 1 capsule by mouth daily      Vitamin D3 (CHOLECALCIFEROL) 50 MCG (2000 UT) tablet Take 1 tablet (50 mcg total) by mouth daily      loratadine (CLARITIN) 10 MG tablet Take 10 mg by mouth as needed 30 tablet 11    Melatonin 1 MG Cap Take by mouth as needed  0     No current facility-administered medications on file prior to visit.     Allergies:  Allergies   Allergen Reactions  Codeine     Morphine Nausea Only    Sesame Oil     Soybean-Containing Drug Products      Vitals:  Vitals:    08/15/21 0901 08/15/21 0915   BP: 148/80 132/78   BP Site: Left arm Left arm   Patient Position: Sitting    Cuff Size: Medium    Pulse: 70    Temp: 97.8 F (36.6 C)    TempSrc: Tympanic    SpO2: 97%    Weight: 73 kg (161 lb)    Height: 1.651 m (5\' 5" )        BP Readings from Last 3 Encounters:   08/15/21 132/78   07/17/21 (!) 180/110   07/15/21 158/67     Wt Readings from Last 3 Encounters:   08/15/21 73 kg (161 lb)   07/17/21 73 kg (161 lb)   07/15/21 73 kg (161 lb)     Body mass index is 26.79 kg/m.     Objective:     Physical Exam:  Physical Exam  Constitutional:       General: She is not in acute distress.     Appearance: She is not ill-appearing.   HENT:      Head: Normocephalic.   Cardiovascular:      Rate and Rhythm: Normal rate and regular rhythm.      Heart sounds: Normal heart sounds.   Pulmonary:      Effort: Pulmonary effort is normal.    Neurological:      Mental Status: She is alert.   Psychiatric:         Behavior: Behavior normal.     Assessment:     The 10-year ASCVD risk score (Arnett DK, et al., 2019) is: 17.4%    Values used to calculate the score:      Age: 46 years      Sex: Female      Is Non-Hispanic African American: No      Diabetic: No      Tobacco smoker: No      Systolic Blood Pressure: 132 mmHg      Is BP treated: Yes      HDL Cholesterol: 72 mg/dL      Total Cholesterol: 168 mg/dL    Aimee Wade was seen today for follow-up, medication refill and ankle pain.    Diagnoses and all orders for this visit:    Hypertension, unspecified type  -     Basic Metabolic Panel    Acute left ankle pain    Other orders  -     GFR  -     Hemolysis index  -     risedronate (ACTONEL) 150 MG tablet; Take 1 tablet (150 mg) by mouth every 30 (thirty) days      Plan:     Hypertension: Recommend more plant based, low salt diet.  Recommend checking home blood pressures.  On losartan 50 mg daily, may uptitrate in the future if blood pressure remains elevated.  Will repeat renal function with solitary kidney  Aortic atherosclerosis: Noted on previous CT scan.  On atorvastatin 10 mg daily  Osteoporosis: Recommend dietary calcium intake, vitamin D, weight bearing exercises, minimize alcohol.  On risedronate 150 mg monthly.  Consider DEXA every 2 years.  May follow up with endocrinology in the future  Left ankle pain: Suspect musculoskeletal, possible tendonitis.  Would recommend supportive footwear, ice/heat, Tylenol as needed, gentle stretching.  She will let us know if not improving, consider  physical therapy    Return in about 3 months (around 11/12/2021) for hypertension.    Concha Norway, MD

## 2021-08-29 DIAGNOSIS — M25572 Pain in left ankle and joints of left foot: Secondary | ICD-10-CM

## 2021-08-29 HISTORY — DX: Pain in left ankle and joints of left foot: M25.572

## 2021-10-31 ENCOUNTER — Ambulatory Visit (FREE_STANDING_LABORATORY_FACILITY): Payer: Medicare Other | Admitting: Female Pelvic Medicine and Reconstructive Surgery

## 2021-10-31 ENCOUNTER — Encounter (INDEPENDENT_AMBULATORY_CARE_PROVIDER_SITE_OTHER): Payer: Self-pay | Admitting: Internal Medicine

## 2021-10-31 DIAGNOSIS — N393 Stress incontinence (female) (male): Secondary | ICD-10-CM

## 2021-10-31 DIAGNOSIS — R3915 Urgency of urination: Secondary | ICD-10-CM

## 2021-10-31 NOTE — Progress Notes (Addendum)
Patient stopped by office today to leave a urine sample for suspected UTI. Urine dipstick shows negative for all components, positive for leukocytes, red blood cells. Standing urine culture order 2/3 created by NP [place] printed and culture will be sent to lab for further processing.     I was present and a participant for the visit and entire procedure.    I have examined this patient and have reviewed the notes, assessments and/or procedures performed by Dr. Judi Saa. I concur with her/his documentation of this patient-with these modifications made directly to the note.

## 2021-11-03 ENCOUNTER — Encounter (INDEPENDENT_AMBULATORY_CARE_PROVIDER_SITE_OTHER): Payer: Self-pay | Admitting: Female Pelvic Medicine and Reconstructive Surgery

## 2021-11-04 ENCOUNTER — Other Ambulatory Visit (INDEPENDENT_AMBULATORY_CARE_PROVIDER_SITE_OTHER): Payer: Self-pay | Admitting: Female Pelvic Medicine and Reconstructive Surgery

## 2021-11-04 DIAGNOSIS — N3000 Acute cystitis without hematuria: Secondary | ICD-10-CM

## 2021-11-04 MED ORDER — NITROFURANTOIN MONOHYD MACRO 100 MG PO CAPS
100.0000 mg | ORAL_CAPSULE | Freq: Two times a day (BID) | ORAL | 0 refills | Status: AC
Start: 2021-11-04 — End: 2021-11-11

## 2021-11-04 NOTE — Telephone Encounter (Signed)
Pt utilized a standing UC order which is + E coli and sensitive to Macrobid. Please sign pended order if appropriate.

## 2021-11-18 ENCOUNTER — Encounter (INDEPENDENT_AMBULATORY_CARE_PROVIDER_SITE_OTHER): Payer: Self-pay | Admitting: Internal Medicine

## 2021-11-18 ENCOUNTER — Ambulatory Visit (FREE_STANDING_LABORATORY_FACILITY): Payer: Medicare Other | Admitting: Internal Medicine

## 2021-11-18 VITALS — BP 130/84 | HR 73 | Temp 98.3°F | Resp 16 | Ht 65.0 in | Wt 160.0 lb

## 2021-11-18 DIAGNOSIS — I10 Essential (primary) hypertension: Secondary | ICD-10-CM

## 2021-11-18 DIAGNOSIS — R300 Dysuria: Secondary | ICD-10-CM

## 2021-11-18 MED ORDER — LOSARTAN POTASSIUM 50 MG PO TABS
75.0000 mg | ORAL_TABLET | Freq: Every day | ORAL | 1 refills | Status: DC
Start: 2021-11-18 — End: 2022-03-31

## 2021-11-18 MED ORDER — SULFAMETHOXAZOLE-TRIMETHOPRIM 800-160 MG PO TABS
1.0000 | ORAL_TABLET | Freq: Two times a day (BID) | ORAL | 0 refills | Status: DC
Start: 2021-11-18 — End: 2021-11-21

## 2021-11-18 NOTE — Progress Notes (Signed)
Subjective:      Patient ID: Aimee Wade is a 76 y.o. female     Chief Complaint:  Chief Complaint   Patient presents with    Hypertension     3 month follow up     Urinary Tract Infection Symptoms     Frequent urination, burning when urinate x3 days.      HPI:  11/18/21:  Here for follow up of hypertension.  Also reports had UTI 11/02/21 took Macrobid x 5 days, was seeing urogynecology.  Reports urinary frequency, dysuria.  Going to see new urologist in Sept.  Reports left lateral ankle pain, heating pad helped.  Feeling better today.    08/15/21:  Overall feeling well.  Home blood pressure 130s/70s.  Denies chest pain, shortness of breath.  Left medial ankle pain, hurts with extending toes past week.  Tried topical IcyHot.  Requesting refill on monthly risedronate, reports has been on for 2 years.  Previously was on Prolia, reports then saw endocrinology and was started on risedronate 2020.  Follows gynecology Dr. Conley Simmonds.    07/17/21:  Had UTI in December, saw urogyn, no infection however vaginal atrophy, using vaginal estrogen.  Blood pressure was high, asymptomatic.  Aortic atherosclerosis noted on previous CT scan.  Walks 15 min with her 2 yorkies.  Going to [place] in Feb, long flight.    03/28/21:  Overall feeling well.  Previous PCP Dr. Vernie Murders in [facility], moved Nov 2021 to be closer to granddaughters.  Had right breast cancer s/p surgery.  History of desmoid tumor s/p left nephrectomy, radiation.  Follows gastroenterology, get colonoscopy every 5 years, previously on mesalamine.  History of recurrent UTI, wishes to follow up with urology.  Uses clobetasol cream vaginally.  Lives with husband, retired Runner, broadcasting/film/video.    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    Erosive lichen planus of vagina    Nocturia    Essential hypertension    Acute left ankle pain     Current Medications:  Current Outpatient Medications on File Prior to Visit   Medication Sig Dispense Refill    Acetaminophen (Tylenol) 325 MG Cap Take by mouth      Alpha-D-Galactosidase (Beano) Tab Take by mouth  0    Apoaequorin (Prevagen) 10 MG Cap Take by mouth      atorvastatin (LIPITOR) 10 MG tablet Take 1 tablet (10 mg) by mouth daily 90 tablet 3    Clobetasol Propionate 0.025 % Cream Apply topically      famotidine (PEPCID) 20 MG tablet Take 1 tablet (20 mg) by mouth daily      loratadine (CLARITIN) 10 MG tablet Take 1 tablet (10 mg) by mouth as needed 30 tablet 11    Melatonin 1 MG Cap Take by mouth as needed  0    Multiple Vitamin (multivitamin) capsule Take 1 capsule by mouth daily      risedronate (ACTONEL)  150 MG tablet Take 1 tablet (150 mg) by mouth every 30 (thirty) days 3 tablet 3    Vitamin D3 (CHOLECALCIFEROL) 50 MCG (2000 UT) tablet Take 1 tablet (50 mcg) by mouth daily       No current facility-administered medications on file prior to visit.     Allergies:  Allergies   Allergen Reactions    Codeine     Morphine Nausea Only    Sesame Oil     Soybean-Containing Drug Products      Vitals:  Vitals:    11/18/21 0950   BP: 130/84   BP Site: Left arm   Patient Position: Sitting   Cuff Size: Medium   Pulse: 73   Resp: 16   Temp: 98.3 F (36.8 C)   TempSrc: Oral   SpO2: 97%   Weight: 72.6 kg (160 lb)   Height: 1.651 m (5\' 5" )       BP Readings from Last 3 Encounters:   11/18/21 130/84   08/15/21 132/78   07/17/21 (!) 180/110     Wt Readings from Last 3 Encounters:   11/18/21 72.6 kg (160 lb)   08/15/21 73 kg (161 lb)   07/17/21 73 kg (161 lb)     Body mass index is 26.63 kg/m.     Objective:      Physical Exam:  Physical Exam  Constitutional:       General: She is not in acute distress.     Appearance: She is not ill-appearing.   HENT:      Head: Normocephalic.   Cardiovascular:      Rate and Rhythm: Normal rate and regular rhythm.      Heart sounds: Normal heart sounds.   Pulmonary:      Effort: Pulmonary effort is normal.   Abdominal:      Palpations: Abdomen is soft.      Tenderness: There is no abdominal tenderness. There is no right CVA tenderness or left CVA tenderness.   Musculoskeletal:         General: Normal range of motion.   Neurological:      Mental Status: She is alert.   Psychiatric:         Behavior: Behavior normal.       Assessment:     The 10-year ASCVD risk score (Arnett DK, et al., 2019) is: 16.9%    Values used to calculate the score:      Age: [age] years      Sex: Female      Is Non-Hispanic African American: No      Diabetic: No      Tobacco smoker: No      Systolic Blood Pressure: 130 mmHg      Is BP treated: Yes      HDL Cholesterol: 72 mg/dL      Total Cholesterol: 168 mg/dL    Aimee Wade was seen today for hypertension and urinary tract infection symptoms.    Diagnoses and all orders for this visit:    Essential hypertension    Dysuria  -     Urine culture    Other orders  -     Discontinue: sulfamethoxazole-trimethoprim (Bactrim DS) 800-160 MG per tablet; Take 1 tablet by mouth 2 (two) times daily for 3 days  -     losartan (COZAAR) 50 MG tablet; Take 1.5 tablets (75 mg) by mouth daily      Plan:     Hypertension, uncontrolled: Recommend more plant  based, low salt diet.  Recommend checking home blood pressures.  Will increase losartan from 50 mg to 75 mg dailydaily, may uptitrate in the future if blood pressure remains elevated.  Monitor renal function with solitary kidney  Dysuria: With history of recent UTI.  Will obtain urine culture and treat if positive  Aortic atherosclerosis: Noted on previous CT scan.  On atorvastatin 10 mg daily  Osteoporosis: Recommend dietary calcium  intake, vitamin D, weight bearing exercises, minimize alcohol.  On risedronate 150 mg monthly.  Consider DEXA every 2 years.  May follow up with endocrinology in the future  Left ankle pain, improved: Suspect musculoskeletal, possible tendonitis and/or osteoarthritis.  Would recommend supportive footwear, ice/heat, Tylenol as needed, gentle stretching.  She will let us know if recurs, consider physical therapy    Return in about 5 months (around 04/20/2022) for Medicare wellness, hypertension.    Concha Norway, MD

## 2021-11-21 ENCOUNTER — Other Ambulatory Visit (INDEPENDENT_AMBULATORY_CARE_PROVIDER_SITE_OTHER): Payer: Self-pay | Admitting: Internal Medicine

## 2021-11-25 MED ORDER — SULFAMETHOXAZOLE-TRIMETHOPRIM 800-160 MG PO TABS
1.0000 | ORAL_TABLET | Freq: Two times a day (BID) | ORAL | 0 refills | Status: AC
Start: 2021-11-25 — End: 2021-11-28

## 2021-11-25 NOTE — Telephone Encounter (Signed)
Last filled 11/18/21  Last appt 11/18/21  Pt has AWV 03/31/22

## 2021-12-24 ENCOUNTER — Other Ambulatory Visit (INDEPENDENT_AMBULATORY_CARE_PROVIDER_SITE_OTHER): Payer: Self-pay | Admitting: Internal Medicine

## 2022-01-09 ENCOUNTER — Other Ambulatory Visit (INDEPENDENT_AMBULATORY_CARE_PROVIDER_SITE_OTHER): Payer: Self-pay | Admitting: Internal Medicine

## 2022-01-19 ENCOUNTER — Ambulatory Visit (INDEPENDENT_AMBULATORY_CARE_PROVIDER_SITE_OTHER): Payer: Medicare Other | Admitting: Urology

## 2022-01-19 ENCOUNTER — Encounter (INDEPENDENT_AMBULATORY_CARE_PROVIDER_SITE_OTHER): Payer: Self-pay | Admitting: Urology

## 2022-01-19 VITALS — BP 137/89 | HR 79 | Ht 65.35 in | Wt 160.0 lb

## 2022-01-19 DIAGNOSIS — Z905 Acquired absence of kidney: Secondary | ICD-10-CM

## 2022-01-19 DIAGNOSIS — R102 Pelvic and perineal pain: Secondary | ICD-10-CM | POA: Insufficient documentation

## 2022-01-19 DIAGNOSIS — N3946 Mixed incontinence: Secondary | ICD-10-CM

## 2022-01-19 DIAGNOSIS — D481 Neoplasm of uncertain behavior of connective and other soft tissue: Secondary | ICD-10-CM

## 2022-01-19 DIAGNOSIS — R300 Dysuria: Secondary | ICD-10-CM

## 2022-01-19 DIAGNOSIS — Z853 Personal history of malignant neoplasm of breast: Secondary | ICD-10-CM

## 2022-01-19 DIAGNOSIS — R109 Unspecified abdominal pain: Secondary | ICD-10-CM

## 2022-01-19 HISTORY — DX: Pelvic and perineal pain: R10.2

## 2022-01-19 LAB — URINALYSIS POC
POCT Urine Bilirubin: NEGATIVE
POCT Urine Glucose: NEGATIVE mg/dL
POCT Urine Ketones: NEGATIVE mg/dL
POCT Urine Nitrites: POSITIVE — AB
POCT Urine Urobilibogen: 0.2 mg/dL (ref 0.0–1.0)
POCT Urine pH: 6 (ref 5.0–8.0)
Protein, UR POCT: 30 mg/dL — AB
Urine Specific Gravity POC: 1.02 (ref 1.001–1.035)

## 2022-01-19 LAB — URINE MICROSCOPIC

## 2022-01-19 MED ORDER — ESTRADIOL 0.1 MG/GM VA CREA
TOPICAL_CREAM | VAGINAL | 2 refills | Status: DC
Start: 2022-01-19 — End: 2023-01-08

## 2022-01-19 NOTE — Progress Notes (Signed)
Subjective:      Patient ID: Aimee Wade is a 76 y.o. 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   History of breast cancer- 2000 s/p mastectomy   Irregular bowel movements- "food sensitivity" fluctuates from constipation and diarrhea     No gross hematuria  No fever or chills with infection       2- vaginal   Regular PAP      Not sexually active 2/2 pain       No known history of kidney stones    Pt moved from [place], had cystoscopy x 2 "didn't have bladder cancer"     The following portions of the patient's history were reviewed and updated as appropriate: allergies, current medications, past family history, past medical history, past social history, past surgical history and problem list.       Objective:   BP 137/89 (BP Site: Left arm, Patient Position: Sitting, Cuff Size: Large)   Pulse 79   Ht 1.66 m (5' 5.35")   Wt 72.6 kg (160 lb)   BMI 26.34 kg/m    vital signs reviewed  Physical Exam   Constitutional: Pt is oriented to person, place, and time and well-developed, well-nourished, and in no distress.   Pulmonary/Chest: Effort normal.   Abdominal: Pt exhibits no obvious distension        Lab Review   I reviewed the labs listed in bold today and discussed the results with the patient.   Previous labs listed below were reviewed as relevant to plan.     Urine analysis shows blood moderate  LE large Nit positive  11/18/2021- > 100,000 E. Coli (pansensitive)  11/02/2021- > 100,000 E. Coli (pansensitive)  07/30/2021-negative urine culture  06/16/2021- > 100,000 e coli (pansensitive)      Assessment:     1. Dysuria    2. Mixed urge  and stress incontinence    3. Pelvic pain in female    4. History of right breast cancer    5. Desmoid tumor of abdomen    6. Right flank pain    7. Acquired solitary kidney        Reviewed risk and benefits of estrace cream including but not limited to topical reaction, irregular bleeding, increased risks of cancer, heart attack/stroke     -Should follow-up with nurse practitioners regarding pelvic pain and chronic UTIs     Plan:     Patient Instructions   1. Timed voiding (void every 2-3 hours even if you don't feel like you have to go during the day)  2. Start taking probiotics (culturelle, align, CVS brand, any brand is fine)  3. Please write down what you had to eat, drink, sex, exercise 24 hours prior to onset of bladder pain. This can be done with a voiding irritant journal in paper or app format (try to find a pattern for when you bladder pain is worse, ie. Sex, exercise, foods, alcohol)  4. Drink enough water in the morning to keep urine clear or light yellow by lunchtime. Then drink when thirsty in the afternoon and evening.   5. Positional  voiding (sit straight up at the end of voiding then lean forward to help empty your bladder)  6. Please start D-Mannose 1000 mg per day. It will work best if taken with a vitamin C supplement. (Amazon, WholeFoods, any brand is fine)   7. Please start a 36 mg PACs supplement (active ingredient in cranberry supplements, ie. Avel Peace, etc)   8. Renal ultrasound   9. Estrace cream- fingertip amount to outside of vaginal twice a week   10. Pelvic floor physical therapy   11. 48 hour voiding diary prior to PT       I have written an order for your radiology imaging study.  There are many convenient locations where these tests can be performed, below are two options (Please pick one).  To schedule the test, you will need to call or go online:    [facility]:     [phone] or [email or web address]  [facility]:   [phone] or  [email or web address]      Please note: If you have [facility] Choice please make sure you schedule at a non-[place] building to avoid extra fees.      --------------  I appreciate the opportunity to help take care of you and your family. Here is some basic housekeeping:      1) The FASTEST way to receive results and communicate with our office is via the portal, MyChart.  Please make sure you sign up and have an active account.    2) Please be aware that I often send results/next steps via MyChart. Please check your account regularly.  Occasionally notes that I send do not trigger email warnings, if you have not heard from me regarding pending results please check MyChart prior to calling the office.    3) There are some results that take up to 10 business days to be received and/or processed. If you have not heard from me regarding results in 7-10 business days please feel free to call [phone] or send a message via MyChart.     4) Messages left in the general voice mailbox are not always able to be returned the same day.  If you have an urgent matter, please speak to a nurse or secretary directly. If you are having difficulty getting through to office staff please send a message via MyChart.    5) If you have surgical questions please send a MyChart message to Charise Killian or call [phone]        This note was generated by the Epic EMR system/ Dragon speech recognition and may contain inherent errors or omissions not intended by the user. Grammatical errors, random word insertions, deletions, pronoun errors and incomplete sentences are occasional consequences of this technology due to software limitations. Not all errors are caught or corrected. Due to Epic EMR update there are occasional errors where the note plan is being deleted. If this is the case please see the patient instructions portion of the EMR for detailed plan.  If there are questions or concerns about the content of this  note or information contained within the body of this dictation they should be addressed directly with the author for clarification.           Pelvic Floor Physical Therapy Providers  [facility]  [address], [place] [address]   Rosalva Ferron, PT, DPT, OCS Ph: [phone] Fax: [phone]    [facility] Department of Rehabilitation Physical Therapy  Boone Master   Ph: [phone] Fax: [phone]    [facility] [address], [place] [address]   Jolaine Artist, PT, DPT  Ph: [phone] Fax: [phone]      [facility] [place] [facility] [address], [place], [place] [address]   Jesse Sans, PT, DPT Ph: [phone] opt:2 Fax [phone]    [facility]  [address], [place] [address]   Nolon Rod, MHS,PT  Ph: [phone] Fax: [phone]    [facility] [address] [place], [place] [employer] [place] [address]  Dr Melinda Crutch, DFT  Ph: [phone]    [facility]  [address] [facility] [address]   Pany Nazari, PT, BCB-PMD Ph: [phone] Fax: [phone]    [facility] [address], [place], [place] [address]   Trellis Moment, Peculiar Ph: [phone] Fax: [phone]    [facility] [address], [place] [address]   Lauris Poag DPT  Ph: [phone] Fax: [phone]    [facility]  [address] [place] [address]   Lockie Mola, MPT  Ph: [phone] Fax: [phone]    [facility] [address] [place], [place], [place] [address]  Shella Spearing, MPT/Karen Warwick MSPT Ph: [phone] Fax: [phone]    [facility] [address], [place], [place] [address]   Berneta Levins, PT, [place]  HF:[phone] Fax: [phone]    [facility] [address],  [place] [address]   Kela Millin, MPT/ Anell Barr, PT, MHA/Lynn Delcie Roch, PT, DPT       Ph: [phone] Fax: [phone]    [facility]    [address], [place] [address]       [address], [place] [address]  Olena Mater, MS, PT  Rh: [phone] Fax: [phone]    [facility]  [address], [place], [place] [address]   Nolon Rod, PT, MHS  Ph: [phone] Fax: [phone]    [facility], MSPT  [address], [place], [place] [address]  Barbaraann Rondo, PT, DPT   Ph: [phone] Fax: [phone]          Orders  Orders Placed This Encounter   Procedures    Urine culture     Order Specific Question:   Release to patient     Answer:   Immediate    US Renal Kidney Bladder Complete     Standing Status:   Future     Standing Expiration Date:   01/20/2023     Order Specific Question:   Clinical info for radiologist     Answer:   right flank pain, solitary kidney     Order Specific Question:   Release to patient     Answer:   Immediate     Order Specific Question:   Reason for Exam:     Answer:   right flank pain, solitary kidney    Urinalysis POC    Microscopic, Urine    Ambulatory referral to Physical Therapy     Standing Status:   Future     Standing Expiration Date:   01/20/2023     Referral Priority:   Routine     Referral Type:   Consultation     Referral Reason:   Specialty Services Required  Requested Specialty:   Physical Therapy     Number of Visits Requested:   1

## 2022-01-19 NOTE — Patient Instructions (Addendum)
1. Timed voiding (void every 2-3 hours even if you don't feel like you have to go during the day)  2. Start taking probiotics (culturelle, align, CVS brand, any brand is fine)  3. Please write down what you had to eat, drink, sex, exercise 24 hours prior to onset of bladder pain. This can be done with a voiding irritant journal in paper or app format (try to find a pattern for when you bladder pain is worse, ie. Sex, exercise, foods, alcohol)  4. Drink enough water in the morning to keep urine clear or light yellow by lunchtime. Then drink when thirsty in the afternoon and evening.   5. Positional voiding (sit straight up at the end of voiding then lean forward to help empty your bladder)  6. Please start D-Mannose 1000 mg per day. It will work best if taken with a vitamin C supplement. (Amazon, WholeFoods, any brand is fine)   7. Please start a 36 mg PACs supplement (active ingredient in cranberry supplements, ie. Avel Peace, etc)   8. Renal ultrasound   9. Estrace cream- fingertip amount to outside of vaginal twice a week   10. Pelvic floor physical therapy   11. 48 hour voiding diary prior to PT       I have written an order for your radiology imaging study.  There are many convenient locations where these tests can be performed, below are two options (Please pick one).  To schedule the test, you will need to call or go online:    [facility]:     [phone] or [email or web address]  [facility]:   [phone] or [email or web address]      Please note: If you have [facility] Choice please make sure you schedule at a non- building to avoid extra fees.      --------------  I appreciate the opportunity to help take care of you and your family. Here is some basic housekeeping:      1) The FASTEST way to receive results and communicate with our office is via the portal, MyChart.  Please make sure you sign up and have an active account.    2) Please be aware that I often  send results/next steps via MyChart. Please check your account regularly.  Occasionally notes that I send do not trigger email warnings, if you have not heard from me regarding pending results please check MyChart prior to calling the office.    3) There are some results that take up to 10 business days to be received and/or processed. If you have not heard from me regarding results in 7-10 business days please feel free to call [phone]) or send a message via MyChart.     4) Messages left in the general voice mailbox are not always able to be returned the same day.  If you have an urgent matter, please speak to a nurse or secretary directly. If you are having difficulty getting through to office staff please send a message via MyChart.    5) If you have surgical questions please send a MyChart message to Charise Killian or call [phone]        This note was generated by the Epic EMR system/ Dragon speech recognition and may contain inherent errors or omissions not intended by the user. Grammatical errors, random word insertions, deletions, pronoun errors and incomplete sentences are occasional consequences of this technology due to software limitations. Not all errors are caught or corrected. Due to  Epic EMR update there are occasional errors where the note plan is being deleted. If this is the case please see the patient instructions portion of the EMR for detailed plan.  If there are questions or concerns about the content of this note or information contained within the body of this dictation they should be addressed directly with the author for clarification.           Pelvic Floor Physical Therapy Providers  [facility]  [address], [place] [id number]   Rosalva FerronLauren Trosch, PT, DPT, OCS Ph: [phone] Fax: [phone]    [facility] Department of Rehabilitation Physical Therapy   Boone MasterSophie Geissler   Ph: [phone] Fax: [phone]    [facility]  [facility] [address], [place] [id number]   Jolaine ArtistJulie Rimel, PT, DPT  Ph: [phone] Fax: [phone]      [facility] [address], [place], [place] [address]   Jesse Sansaylor Crossfield, PT, DPT Ph: [phone] opt:2 Fax [phone]    [facility]  [address], [place] [address]   Nolon RodJoanne Gryski, MHS,PT  Ph: [phone] Fax: [phone]    [facility] [address] [place], [place] [address]  Dr Melinda Crutchachel Aronson, DFT  Ph: [phone]    [facility]  [address] [facility] [place] [id number]   Pany Nazari, PT, BCB-PMD Ph: [phone] Fax: [phone]    [facility] [address], [place], [place] [address]   Trellis Momenthristine Chappell, [place] Ph: [phone] Fax: [phone]    [facility] [address], [place] [address]   Lauris PoagSecili DeStefano DPT  Ph: [phone] Fax: [phone]    [facility]  [address], [place] [address]   Lockie MolaKaren Balac, MPT  Ph: [phone] Fax: [phone]    [facility] [address] [place], [place], [place] [address]  Shella SpearingRegina Faison, MPT/Karen CorningAmis MSPT Ph: [phone] Fax: [phone]    [facility] [address], [place], [place] [address]   Berneta LevinsPeggy Treseler, PT, [place]  CX:[phone] Fax: [phone]    [facility] [address], [place] [address]   Kela MillinMary Nalls, MPT/ Anell BarrKristine Caultise, PT, MHA/Lynn Delcie RochJosephs,MSPT/Jamie Drake, PT, DPT       Ph: [phone] Fax: [phone]    [facility]    [address], [place], [place] [address]       [address], [place] [address]  Olena MaterJennifer Chu, MS, PT  Rh: [phone] Fax: [phone]    [facility]  [address], [place], [place] [address]   Nolon RodJoanne Gryski, PT, MHS  Ph: [phone] Fax: [phone]    [facility] BeanMargaret Wallace, MSPT  [address], [place], [place] [address]  Barbaraann Rondoanielle Rose, PT, DPT   Ph: [phone] Fax: [phone]

## 2022-01-23 ENCOUNTER — Other Ambulatory Visit (INDEPENDENT_AMBULATORY_CARE_PROVIDER_SITE_OTHER): Payer: Self-pay | Admitting: Urology

## 2022-01-23 MED ORDER — SULFAMETHOXAZOLE-TRIMETHOPRIM 800-160 MG PO TABS
1.0000 | ORAL_TABLET | Freq: Two times a day (BID) | ORAL | 0 refills | Status: AC
Start: 2022-01-23 — End: 2022-01-30

## 2022-01-23 NOTE — Progress Notes (Signed)
Urine culture shows infection.   If you are not having blood in the urine, fever, chills or worsening pain no need to treat with antibiotics. I have called in bactrim for you to have on hand for emergencies

## 2022-01-29 ENCOUNTER — Ambulatory Visit
Admission: RE | Admit: 2022-01-29 | Discharge: 2022-01-29 | Disposition: A | Discharge status: Home / Self Care | Payer: Medicare Other | Source: Ambulatory Visit | Attending: Urology | Admitting: Urology

## 2022-01-29 DIAGNOSIS — R109 Unspecified abdominal pain: Secondary | ICD-10-CM | POA: Insufficient documentation

## 2022-01-29 DIAGNOSIS — Z905 Acquired absence of kidney: Secondary | ICD-10-CM | POA: Insufficient documentation

## 2022-01-31 NOTE — Progress Notes (Signed)
Normal right kidney.   No stones or masses noted.

## 2022-02-16 ENCOUNTER — Other Ambulatory Visit: Payer: Self-pay

## 2022-02-16 DIAGNOSIS — Z860100 Personal history of colon polyps, unspecified: Secondary | ICD-10-CM

## 2022-02-16 HISTORY — DX: Personal history of colon polyps, unspecified: Z86.0100

## 2022-02-16 HISTORY — DX: Personal history of colonic polyps: Z86.010

## 2022-02-18 ENCOUNTER — Telehealth (INDEPENDENT_AMBULATORY_CARE_PROVIDER_SITE_OTHER): Payer: Self-pay | Admitting: Internal Medicine

## 2022-02-18 DIAGNOSIS — Z1231 Encounter for screening mammogram for malignant neoplasm of breast: Secondary | ICD-10-CM

## 2022-02-18 NOTE — Telephone Encounter (Signed)
Pt received a message in MyChart stating she needs to have a Mammo Digital Screening Bilateral W CAD. Pt had the order done on 03/2021 , request conformation if another order needs to be sch. please advise.

## 2022-02-19 ENCOUNTER — Encounter (INDEPENDENT_AMBULATORY_CARE_PROVIDER_SITE_OTHER): Payer: Self-pay | Admitting: Internal Medicine

## 2022-02-19 NOTE — Telephone Encounter (Signed)
Screening mammogram ordered

## 2022-03-25 ENCOUNTER — Ambulatory Visit (INDEPENDENT_AMBULATORY_CARE_PROVIDER_SITE_OTHER): Payer: Medicare Other | Admitting: Urology

## 2022-03-26 ENCOUNTER — Ambulatory Visit: Payer: Medicare Other

## 2022-03-26 DIAGNOSIS — L570 Actinic keratosis: Secondary | ICD-10-CM

## 2022-03-26 DIAGNOSIS — D225 Melanocytic nevi of trunk: Secondary | ICD-10-CM | POA: Insufficient documentation

## 2022-03-26 DIAGNOSIS — L821 Other seborrheic keratosis: Secondary | ICD-10-CM

## 2022-03-26 HISTORY — DX: Actinic keratosis: L57.0

## 2022-03-26 HISTORY — DX: Melanocytic nevi of trunk: D22.5

## 2022-03-26 HISTORY — DX: Other seborrheic keratosis: L82.1

## 2022-03-26 NOTE — PSS Phone Screening (Signed)
Pre-Anesthesia Evaluation    Pre-op phone visit requested by: Huntley Estelle, MD  Reason for pre-op phone visit: Patient anticipating COLONOSCOPY procedure.         No orders of the defined types were placed in this encounter.      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   03/28/2021    Overview Signed 03/28/2021  6:38 PM by Iris Pert, MD     right thigh         Osteoporosis without current pathological fracture, unspecified osteoporosis type M81.0   03/28/2021    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   03/28/2021    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   03/28/2021    Recurrent UTI N39.0   04/01/2021    Cholelithiasis K80.20   05/09/2021    Hepatic steatosis K76.0   05/09/2021    Aortic atherosclerosis I70.0   05/09/2021    Right middle lobe pulmonary nodule R91.1   05/09/2021    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    At age 76 had my first  colonoscopy where the doctor found polyps which he removed. Have had colonoscopies  every 5 years.    Desmoid tumor of abdomen 1975    s/p left nephrectomy, radiation at [facility]    Foot fracture, left     Hepatic steatosis     History of melanoma     right thigh    History of right breast cancer     History of sebaceous gland carcinoma     left  thigh    Hypertension 04/2021    I noticed my blood pressure had gone up after a doctor appointment.    Inflammatory bowel disease     Lymphocytic colitis    Lymphocytic colitis     Lymphocytic colitis 03/28/2021    Osteoporosis     Peripheral vascular disease 1975    Surgery in left groin.    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 However when i moved up here and changed doctors the antibiotic  prescription was allowed to run out and now my uti's are back.     Past Surgical History:  Procedure Laterality Date    BREAST IMPLANT Right     BUNIONECTOMY      COLON SURGERY  Began in 1974 ended 1980    Desmoid tumor    HYSTERECTOMY  1979    MASTECTOMY Right     1996    NEPHRECTOMY Left 1979    desmoid tumor    OOPHORECTOMY  1979    Possibly just the left. Radiation followed the next year on the right side.    SMALL INTESTINE SURGERY  Begin in 1974    During my surgery (7) for my desmoid tumor.    TONSILLECTOMY  1957        Medication List            Accurate as of March 26, 2022  7:31 AM. Always use your most recent med list.                atorvastatin 10 MG tablet  Take 1 tablet (10 mg) by mouth daily  Commonly known as: LIPITOR     Beano Tabs  Take by mouth     Clobetasol Propionate 0.025 % Crea  Apply topically     D-Mannose 500 MG Caps  Take by mouth     * estradiol 0.1 MG/GM vaginal cream  Commonly known as: ESTRACE     * estradiol 0.1 MG/GM vaginal cream  Finger tip amount twice weekly  Commonly known as: ESTRACE VAGINAL     famotidine 20 MG tablet  Take 1 tablet (20 mg) by mouth daily  Commonly known as: PEPCID     loratadine 10 MG tablet  Take 1 tablet (10 mg) by mouth as needed  Commonly known as: CLARITIN     losartan 50 MG tablet  Take 1.5 tablets (75 mg) by mouth daily  Commonly known as: COZAAR     Melatonin 1 MG Caps  Take by mouth as needed      multivitamin capsule  Take 1 capsule by mouth daily     Prevagen 10 MG Caps  Take by mouth  Generic drug: Apoaequorin     risedronate 150 MG tablet  Take 1 tablet (150 mg) by mouth every 30 (thirty) days  Commonly known as: ACTONEL     Tylenol 325 MG Caps  Take by mouth  Generic drug: Acetaminophen     Vitamin D3 50 MCG (2000 UT) tablet  Take 1 tablet (50 mcg) by mouth daily  Commonly known as: CHOLECALCIFEROL           * This list has 2 medication(s) that are the same as other medications prescribed for you. Read the directions carefully, and ask your doctor or other care provider to review them with you.                Allergies   Allergen Reactions    Codeine     Morphine Nausea Only    Sesame Oil     Soybean-Containing Drug Products      Family History   Problem Relation Age of Onset    Thyroid disease Mother     Macular degeneration Mother     Dementia Mother     Arthritis Mother     Clotting disorder Mother     Glaucoma Father     Macular degeneration Father     Hypertension Father         Treated w/medication. Later he didn't need meds.    Prostate cancer Father  Slow growing    Ovarian cancer Sister 69    Colon cancer Maternal Grandmother 23    Cancer Maternal Grandmother         Colon    Breast cancer Paternal Grandmother     Cancer Paternal Grandmother         Breast cancer    Valvular heart disease Son     Valve Surgery Son      Social History     Occupational History    Not on file   Tobacco Use    Smoking status: Never    Smokeless tobacco: Never   Substance and Sexual Activity    Alcohol use: Not Currently    Drug use: Never    Sexual activity: Not Currently     Partners: Male       Menstrual History:   LMP / Status  Hysterectomy     No LMP recorded. Patient has had a hysterectomy.    Tubal Ligation?  No valid surgical or medical questions entered.             Exam Scores:   SDB score      PONV score      MST score      Allergy score      Frailty score         There were no vitals taken for  this visit.

## 2022-03-31 ENCOUNTER — Ambulatory Visit
Admission: RE | Admit: 2022-03-31 | Discharge: 2022-03-31 | Disposition: A | Discharge status: Home / Self Care | Payer: Medicare Other | Source: Ambulatory Visit | Attending: Internal Medicine | Admitting: Internal Medicine

## 2022-03-31 ENCOUNTER — Ambulatory Visit (INDEPENDENT_AMBULATORY_CARE_PROVIDER_SITE_OTHER): Payer: Medicare Other | Admitting: Internal Medicine

## 2022-03-31 ENCOUNTER — Other Ambulatory Visit (INDEPENDENT_AMBULATORY_CARE_PROVIDER_SITE_OTHER): Payer: Self-pay | Admitting: Internal Medicine

## 2022-03-31 ENCOUNTER — Encounter (INDEPENDENT_AMBULATORY_CARE_PROVIDER_SITE_OTHER): Payer: Self-pay | Admitting: Internal Medicine

## 2022-03-31 VITALS — BP 130/70 | HR 88 | Temp 98.5°F | Resp 20 | Ht 65.0 in | Wt 160.0 lb

## 2022-03-31 DIAGNOSIS — M810 Age-related osteoporosis without current pathological fracture: Secondary | ICD-10-CM

## 2022-03-31 DIAGNOSIS — Z23 Encounter for immunization: Secondary | ICD-10-CM

## 2022-03-31 DIAGNOSIS — Z780 Asymptomatic menopausal state: Secondary | ICD-10-CM

## 2022-03-31 DIAGNOSIS — Z0000 Encounter for general adult medical examination without abnormal findings: Secondary | ICD-10-CM

## 2022-03-31 DIAGNOSIS — R799 Abnormal finding of blood chemistry, unspecified: Secondary | ICD-10-CM

## 2022-03-31 DIAGNOSIS — Z1231 Encounter for screening mammogram for malignant neoplasm of breast: Secondary | ICD-10-CM

## 2022-03-31 DIAGNOSIS — I10 Essential (primary) hypertension: Secondary | ICD-10-CM

## 2022-03-31 LAB — CBC AND DIFFERENTIAL
Absolute NRBC: 0 10*3/uL (ref 0.00–0.00)
Basophils Absolute Automated: 0.03 10*3/uL (ref 0.00–0.08)
Basophils Automated: 0.6 %
Eosinophils Absolute Automated: 0.27 10*3/uL (ref 0.00–0.44)
Eosinophils Automated: 5.1 %
Hematocrit: 39.6 % (ref 34.7–43.7)
Hgb: 12.5 g/dL (ref 11.4–14.8)
Immature Granulocytes Absolute: 0.01 10*3/uL (ref 0.00–0.07)
Immature Granulocytes: 0.2 %
Instrument Absolute Neutrophil Count: 3.44 10*3/uL (ref 1.10–6.33)
Lymphocytes Absolute Automated: 1.16 10*3/uL (ref 0.42–3.22)
Lymphocytes Automated: 21.8 %
MCH: 28.5 pg (ref 25.1–33.5)
MCHC: 31.6 g/dL (ref 31.5–35.8)
MCV: 90.4 fL (ref 78.0–96.0)
MPV: 10.3 fL (ref 8.9–12.5)
Monocytes Absolute Automated: 0.4 10*3/uL (ref 0.21–0.85)
Monocytes: 7.5 %
Neutrophils Absolute: 3.44 10*3/uL (ref 1.10–6.33)
Neutrophils: 64.8 %
Nucleated RBC: 0 /100 WBC (ref 0.0–0.0)
Platelets: 248 10*3/uL (ref 142–346)
RBC: 4.38 10*6/uL (ref 3.90–5.10)
RDW: 13 % (ref 11–15)
WBC: 5.31 10*3/uL (ref 3.10–9.50)

## 2022-03-31 LAB — COMPREHENSIVE METABOLIC PANEL
ALT: 20 U/L (ref 0–55)
AST (SGOT): 20 U/L (ref 5–41)
Albumin/Globulin Ratio: 1.3 (ref 0.9–2.2)
Albumin: 3.8 g/dL (ref 3.5–5.0)
Alkaline Phosphatase: 64 U/L (ref 37–117)
Anion Gap: 8 (ref 5.0–15.0)
BUN: 16 mg/dL (ref 7.0–21.0)
Bilirubin, Total: 0.4 mg/dL (ref 0.2–1.2)
CO2: 27 mEq/L (ref 17–29)
Calcium: 9.4 mg/dL (ref 7.9–10.2)
Chloride: 104 mEq/L (ref 99–111)
Creatinine: 0.8 mg/dL (ref 0.4–1.0)
Globulin: 3 g/dL (ref 2.0–3.6)
Glucose: 95 mg/dL (ref 70–100)
Potassium: 4.2 mEq/L (ref 3.5–5.3)
Protein, Total: 6.8 g/dL (ref 6.0–8.3)
Sodium: 139 mEq/L (ref 135–145)
eGFR: >60 mL/min/{1.73_m2} (ref >=60)

## 2022-03-31 LAB — LIPID PANEL
Cholesterol / HDL Ratio: 2 Index
Cholesterol: 119 mg/dL (ref 0–199)
HDL: 59 mg/dL (ref 40–9999)
LDL Calculated: 41 mg/dL (ref 0–99)
Triglycerides: 94 mg/dL (ref 34–149)
VLDL Calculated: 19 mg/dL (ref 10–40)

## 2022-03-31 LAB — HEMOLYSIS INDEX: Hemolysis Index: 8 Index (ref 0–24)

## 2022-03-31 MED ORDER — ATORVASTATIN CALCIUM 10 MG PO TABS
10.0000 mg | ORAL_TABLET | Freq: Every day | ORAL | 3 refills | Status: DC
Start: 2022-03-31 — End: 2023-03-31

## 2022-03-31 MED ORDER — LOSARTAN POTASSIUM 50 MG PO TABS
75.0000 mg | ORAL_TABLET | Freq: Every day | ORAL | 3 refills | Status: DC
Start: 2022-03-31 — End: 2023-06-14

## 2022-03-31 NOTE — Patient Instructions (Addendum)
MEDICARE WELLNESS PERSONAL PREVENTION PLAN   As part of the Medicare Wellness portion of your visit today, we are providing you with this personalized preventative plan of care. We have listed below some of the preventative services that are recommended for patients based upon their age and gender. These recommendations are taken directly from the [place] [employer] [place] [employer] (USPSTF) and the [employer] (ACIP).     Health Maintenance   Topic Date Due    Advance Directive on File  Never done    DXA Scan  Never done    Pneumonia Vaccine Age 75+ (1 - PCV) Never done    HEPATITIS C SCREENING  Never done    Statin Use  01/09/2023    DEPRESSION SCREENING  03/29/2023    FALLS RISK ANNUAL  04/01/2023    Medicare Annual Wellness Visit  04/01/2023    MAMMOGRAM  04/13/2023    Colorectal Cancer Screening  06/29/2026    Tetanus Ten-Year  06/30/2027    Shingrix Vaccine 50+  Completed    INFLUENZA VACCINE  Addressed    COVID-19 Vaccine  Addressed       Health Maintenance Topics with due status: Overdue       Topic Date Due    Advance Directive on File Never done    DXA Scan Never done    Pneumonia Vaccine Age 75+ Never done    HEPATITIS C SCREENING Never done        Immunization History   Administered Date(s) Administered    COVID-19 mRNA BIVALENT vaccine 12 years and above [employer]) 30 mcg/0.3 mL 03/26/2021    COVID-19 mRNA MONOVALENT vaccine PRIMARY SERIES 12 years and above [employer]) 30 mcg/0.3 mL (DO NOT DILUTE) 10/18/2020    INFLUENZA HIGH DOSE 65 YRS+ Quad 0.7 mL 03/03/2021    Zoster (ZOSTAVAX) Vaccine 04/20/2017, 06/27/2017        Your major risk factors: Hypertension, Osteoporosis, and Falls Risk  Recommendations for improvement:  Elevated Cholesterol/Low Salt Diet - Optimize adherence with low cholesterol diet.  Here is a good reference website for tips on healthy eating and ways to lower your cholesterol -   (NoCareers.gl)  Exercise - Engage in age appropriate exercise 150 minutes per week   Osteoporosis - Weight bearing exercises (walking, yoga, Tai-Chi) 150 minutes per week.  Here is a good reference for tips on engaging in weight bearing exercise - https://www.bones.ToysManual.co.za  Hypertension - Monitor your blood pressure daily. Here is a good reference website for tips on managing hypertension - MotivationalSites.no  Falls Prevention - Here is a good reference website for tips on avoiding falls - BikingRewards.pl    The list below includes many common screening recommendations but is not meant to be comprehensive. You may be eligible for other preventative services depending upon your personal risk factors.   Colorectal Cancer Screening - All adults age 96-75 should undergo periodic colorectal cancer screening. The decision to screen for colorectal cancer in adults aged 69 to 5 years should be an individual one,taking into account your overall health and prior screening history.   Breast Cancer Screening - Women age 63-74 should have mammograms every other year (please note that this recommendation may not be appropriate for every woman - your physician can answer specific questions you may have). The USPSTF concludes that the current evidence is insufficient to assess the balance of benefits and harms of screening mammography in women aged 31 years or older.    Cervical Cancer  Screening - Women over 30 do not require pap smears as long as prior screening has been normal and are not otherwise at high risk for cervical cancer. For women aged 82 to 55 years, the USPSTF recommends screening every 3 years with cervical cytology alone, every 5 years with high-risk human papillomavirus (hrHPV) testing alone, or every 5 years with hrHPV  testing in combination with cytology (cotesting).   Osteoporosis Screening -  The USPSTF recommends screening for osteoporosis with bone measurement testing to prevent osteoporotic fractures in women 65 years and older.  Hepatitis C Screening - Recommend screening for hepatitis C virus (HCV) infection in all adults aged 53 to 75 years.  Lung cancer Screening - Recommend annual screening for lung cancer with low-dose computed tomography (LDCT) in adults ages 36 to 92 years who have a 20 pack-year smoking history and currently smoke or have quit within the past 15 years.  Recommended Vaccinations   Influenza one dose annually   Tetanus/diphtheria one booster every 10 years   Zoster/Shingles - Shingrix two doses after age 33 (second dose given 2-6 months after first dose)  Pneumovax (PPSV23) one dose for adults aged ?65 years  Prevnar(PCV13) shared clinical decision-making is recommended regarding administration of this vaccine to persons aged ?65 years who do not have an immunocompromising condition, cerebrospinal fluid leak, or cochlear implant.     PERSONAL PREVENTION PLAN   Your Personal Prevention Plan is based on your overall health and your responses to the health questionnaire you completed. The following information is for you to review in addition to the recommendations, referrals, and tests we have discussed at your visit.     Physical Activity:   Physical activity can help you maintain a healthy weight, prevent or control illness, reduce stress, and sleep better. It can also help you improve your balance to avoid falls. Try to build up to and maintain a total of 30 minutes of activity each day. If you are able, try walking, doing yard or housework, and taking the stairs more often. You can also strengthen your muscles with exercises done while sitting or lying down.   Emotional Health:   Feeling "down in the dumps" or anxious every now and then is a natural part of life. If this feeling lasts for a few  weeks or more, talk with me as soon as possible. It could be a sign of a problem that needs treatment. There are many types of treatment available.   Falls:   You can reduce your risk of falling by making changes in your home. Remove items that may cause tripping, improve lighting, and consider installing grab bars.   Talk with me if you have problems with balance and walking. To prevent falls, you may need your vision, hearing, or blood pressure checked. Exercises to improve your strength and balance, or using a cane or walker, may help. Review your medicines with me at every visit, because some can affect balance. Please be sure to let me know if you fall or are fearful you may fall.   Urinary Leakage:   Urine leakage is common, but it is not a normal part of aging. Talk with me about any urine leakage so that the cause can be found and treated. Treatment can include bladder training, exercises, medicine or surgery.   Pain:   We all have aches and pains at times, but chronic pain can change how you feel and live every day. Please talk with me about any  symptoms of chronic pain so that we can determine how best to treat.   Sleep:   Getting a good night's sleep is vital to your health and well-being and can help prevent or manage health problems. Often, sleep can be improved by changing behaviors, including when you go to bed and what you do before bed. Sleep apnea can cause problems such as struggling to stay awake during the day. Please let me know if you would like to learn more about improving your sleep and/or think you may have sleep apnea.   Seat Belt:   Please remember to wear a seat belt when driving or riding in a vehicle. It is one of the most important things you can do to stay safe in a car.   Nutrition:   Remember to eat plenty of fruits, vegetables, whole grains, and dairy. Drink at least 64 ounces (8 full glasses) of water a day, unless you have been advised to limit fluids.   Alcohol:   Alcohol can  have a greater effect on older people, who may feel its effects at a lower amount. Older people should limit alcoholic drinks (no more than one a day for women and no more than two a day for men). Please let me know if alcohol use becomes a problem.   Tobacco:   Not smoking or using other forms of tobacco is one of the most important things you can do for your health. Here is some more information about the importance of quitting smoking and how to quit smoking - BroadJournal.com.pt  Advance Directives:   There may come a time when medical decisions need to be made on your behalf. Please talk with your family, and with me, about your wishes. It is important to provide information about your decisions, and any formal advance directives, for your medical record. Here is additional information on advanced directives - [email or web address]  Additional Support:   Sometimes it can be challenging to manage all aspects of daily life. Finding the right support can help you maintain or improve your health and independence. Please let me know if you would like to talk further about finding resources to assist you.

## 2022-03-31 NOTE — Progress Notes (Signed)
[facility] INTERNAL MEDICINE - AN [place] [facility]                 Date of Exam: 03/31/2022         Patient ID: Aimee Wade is a 76 y.o. female.        Chief Complaint:    Chief Complaint   Patient presents with    Medicare Annual Wellness Visit             HPI:    HPI   Visit Type: Health Maintenance Visit  Reported Health: good  Dental: due  Vision: up to date, saw ophthalmology, defers eye exam  Hearing: reports left slightly worse but no need for hearing aid  Immunization Status: Recommend COVID-19 vaccination annually  Exercise: could be better   Diet: could be better   Water intake: yes  Caffeine: 8 oz sweet tea lunch  Bone health: gets dietary calcium  Sleep: not good, reports nocturia every 2.5 hours  Reproductive Health: postmenopausal  Safety Elements Used: uses seat belts, smoke detectors in household, carbon monoxide detectors in household, and sunscreen use    03/31/22:  Overall feeling well.  Follows urology Dr. Dimple Casey, reports stopped antibiotics for UTIs, taking D-mannose, cranberry instead, going to see PA in Oct 2023.  Home blood pressure usually 110s-120s systolic, occasionally up to 140s.    11/18/21:  Here for follow up of hypertension.  Also reports had UTI 11/02/21 took Macrobid x 5 days, was seeing urogynecology.  Reports urinary frequency, dysuria.  Going to see new urologist in Sept.  Reports left lateral ankle pain, heating pad helped.  Feeling better today.     08/15/21:  Overall feeling well.  Home blood pressure 130s/70s.  Denies chest pain, shortness of breath.  Left medial ankle pain, hurts with extending toes past week.  Tried topical IcyHot.  Requesting refill on monthly risedronate, reports has been on for 2 years.  Previously was on Prolia, reports then saw endocrinology and was started on risedronate 2020.  Follows gynecology Dr. Conley Simmonds.     07/17/21:  Had UTI in December, saw urogyn, no infection however vaginal atrophy, using vaginal estrogen.  Blood pressure was high,  asymptomatic.  Aortic atherosclerosis noted on previous CT scan.  Walks 15 min with her 2 yorkies.  Going to [place] in Feb, long flight.     03/28/21:  Overall feeling well.  Previous PCP Dr. Vernie Murders in [facility], moved Nov 2021 to be closer to granddaughters.  Had right breast cancer s/p surgery.  History of desmoid tumor s/p left nephrectomy, radiation.  Follows gastroenterology, get colonoscopy every 5 years, previously on mesalamine.  History of recurrent UTI, wishes to follow up with urology.  Uses clobetasol cream vaginally.  Lives with husband, retired Runner, broadcasting/film/video.        ROS:    Review of Systems   Constitutional:  Negative for chills and fever.   Eyes:  Negative for visual disturbance.   Respiratory:  Negative for cough and shortness of breath.    Cardiovascular:  Negative for chest pain and leg swelling.   Gastrointestinal:  Negative for abdominal pain, constipation and diarrhea.   Genitourinary:  Negative for dysuria and hematuria.   Musculoskeletal:  Negative for arthralgias and myalgias.   Skin:  Negative for rash and wound.   Neurological:  Negative for dizziness, weakness, numbness and headaches.   Hematological:  Does not bruise/bleed easily.   Psychiatric/Behavioral:  Positive for sleep disturbance. Negative for confusion.  Assessment for Cognitive Impairment:  General Appearance: well groomed  Mood/Affect: appropriate   Memory Assessment: denies memory change    Depression Screening:   Over the past 2 weeks, the patient expresses little interest or pleasure in doing things: no  Over the past 2 weeks the patient felt down, depressed, or hopeless: no    Functional Ability:   Does the patient exhibit a steady gait: yes  How long did it take the patient to get up and walk from a sitting position: wnl  Is the patient self reliant: yes  Does the patient handle his/her own medications: yes  Does the patient handle his/her own money: yes    Instrumental Activities of Daily Living (IADL)  Because of  a health or physical problem, do you need help to:  Shop - no  Do light housework - no  Walk across a room - no  Take a bath or a shower - no  Manage the household finances - no    Did you notice or did patient express any hearing difficulties: no  Did you notice or did patient express any vision difficulties: no  Were distance and reading eye charts used: no    Fall Assessment  Have you fallen in the past year?Marland Kitchen  Do you have problems with walking or balance? - no    Home Safety  Is the patient's home safe: yes  Lighting, loose objects/pets on the floor, type of rugs/carpet, steps or stairs, wet surfaces, clear path from bedroom to bathroom, accessible telephone, monitoring system (Lifeline) - no    Advance Care Planning:   Patient was offered opportunity to discuss this: not addressed  Does patient have an Advance Directive: not on file         Problem List:    Patient Active Problem List   Diagnosis    History of right breast cancer    History of melanoma    History of squamous cell carcinoma of skin    Osteoporosis without current pathological fracture, unspecified osteoporosis type    Desmoid tumor of abdomen    History of left nephrectomy    Recurrent UTI    Cholelithiasis    Hepatic steatosis    Aortic atherosclerosis    Right middle lobe pulmonary nodule    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    Erosive lichen planus of vagina    Nocturia    Essential hypertension    Pelvic pain in female    History of colon polyps    Melanocytic nevi of trunk    Postmenopausal             Current Meds:    Outpatient Medications Marked as Taking for the 03/31/22 encounter (Office Visit) with Iris Pert, MD   Medication Sig Dispense Refill    acetaminophen (Tylenol 8 Hour Arthritis Pain) 650 MG CR tablet Take 1 tablet (650 mg) by mouth daily      Alpha-D-Galactosidase (Beano) Tab Take by mouth as needed  0    Apoaequorin (Prevagen) 10 MG Cap Take 1 capsule (10 mg) by mouth daily      Clobetasol Propionate 0.025 % Cream Apply  topically as needed      D-Mannose 500 MG Cap Take 2 capsules (1,000 mg) by mouth daily      estradiol (ESTRACE VAGINAL) 0.1 MG/GM vaginal cream Finger tip amount twice weekly (Patient taking differently: Place vaginally twice a week Finger tip amount twice weekly; on Wednesdays and Saturdays) 42.5 g  2    famotidine (PEPCID) 20 MG tablet Take 1 tablet (20 mg) by mouth daily      loratadine (CLARITIN) 10 MG tablet Take 1 tablet (10 mg) by mouth as needed 30 tablet 11    Melatonin 3 MG Cap Take 1 capsule (3 mg) by mouth nightly as needed (sleep aide)  0    Multiple Vitamin (multivitamin) capsule Take 1 capsule by mouth daily      Na sulfate, K sulfate, Mg sulfate (Suprep Bowel Prep Kit) oral solution Orally as directed for 2 days      NON FORMULARY Take 1 capsule by mouth daily CRANBERRY PACs      Phenazopyridine HCl (AZO TABS PO) Take by mouth as needed (for bladder infection)      Probiotic Product (PROBIOTIC PO) Take 1 capsule by mouth daily      risedronate (ACTONEL) 150 MG tablet Take 1 tablet (150 mg) by mouth every 30 (thirty) days 3 tablet 3    triamcinolone (KENALOG) 0.1 % cream 1 Application      vitamin D (cholecalciferol) 25 MCG (1000 UT) tablet Take 1 tablet (25 mcg) by mouth daily      [DISCONTINUED] atorvastatin (LIPITOR) 10 MG tablet Take 1 tablet (10 mg) by mouth daily 90 tablet 3    [DISCONTINUED] losartan (COZAAR) 50 MG tablet Take 1.5 tablets (75 mg) by mouth daily 135 tablet 1          Allergies:    Allergies   Allergen Reactions    Codeine Nausea Only     Other reaction(s): Unknown    Morphine Nausea Only     Other reaction(s): Unknown    Sesame Oil     Soybean-Containing Drug Products             Immunization History   Administered Date(s) Administered    COVID-19 mRNA BIVALENT vaccine 12 years and above [employer]) 30 mcg/0.3 mL 03/26/2021    COVID-19 mRNA MONOVALENT vaccine PRIMARY SERIES 12 years and above [employer]) 30 mcg/0.3 mL (DO NOT DILUTE) 10/18/2020    INFLUENZA HIGH DOSE 65 YRS+  Quad 0.7 mL 03/03/2021    RSV vaccine (ABRYSVO), bivalent, RSVpreF A&B, diluent reconstituted, PF, 0.5 mL 03/11/2022    Zoster (ZOSTAVAX) Vaccine 04/20/2017, 06/27/2017      Past Surgical History:    Past Surgical History:   Procedure Laterality Date    BREAST IMPLANT Right     BUNIONECTOMY      COLON SURGERY  Began in 1974 ended 1980    Desmoid tumor    COLONOSCOPY  2018    HYSTERECTOMY  1979    MASTECTOMY Right     1996    NEPHRECTOMY Left 1979    desmoid tumor    OOPHORECTOMY  1979    Possibly just the left. Radiation followed the next year on the right side.    SMALL INTESTINE SURGERY  Begin in 1974    During my surgery (7) for my desmoid tumor.    TONSILLECTOMY  1957           Family History:    Family History   Problem Relation Age of Onset    Thyroid disease Mother     Macular degeneration Mother     Dementia Mother     Arthritis Mother     Clotting disorder Mother     Glaucoma Father     Macular degeneration Father     Hypertension Father  Treated w/medication. Later he didn't need meds.    Prostate cancer Father         Slow growing    Ovarian cancer Sister 17    Colon cancer Maternal Grandmother 91    Cancer Maternal Grandmother         Colon    Breast cancer Paternal Grandmother     Cancer Paternal Grandmother         Breast cancer    Valvular heart disease Son     Valve Surgery Son            Social History:    Social History     Tobacco Use    Smoking status: Never    Smokeless tobacco: Never   Vaping Use    Vaping Use: Never used   Substance Use Topics    Alcohol use: Not Currently    Drug use: Never          Vital Signs:    Vitals:    03/31/22 0944   BP: 130/70   BP Site: Left arm   Patient Position: Sitting   Cuff Size: Medium   Pulse: 88   Resp: 20   Temp: 98.5 F (36.9 C)   TempSrc: Temporal   SpO2: 100%   Weight: 72.6 kg (160 lb)   Height: 1.651 m (5\' 5" )      BP Readings from Last 3 Encounters:   03/31/22 130/70   01/19/22 137/89   11/18/21 130/84     Wt Readings from Last 3 Encounters:    03/31/22 72.6 kg (160 lb)   03/26/22 72.6 kg (160 lb)   01/19/22 72.6 kg (160 lb)     Body mass index is 26.63 kg/m.  A normal BMI is 18.5-24.9        Physical Exam:    Physical Exam  Constitutional:       General: She is not in acute distress.     Appearance: She is not ill-appearing.   HENT:      Head: Normocephalic.      Right Ear: Tympanic membrane normal.      Left Ear: Tympanic membrane normal.      Mouth/Throat:      Mouth: Mucous membranes are moist.      Pharynx: Oropharynx is clear.   Eyes:      Extraocular Movements: Extraocular movements intact.      Conjunctiva/sclera: Conjunctivae normal.      Pupils: Pupils are equal, round, and reactive to light.   Cardiovascular:      Rate and Rhythm: Normal rate and regular rhythm.      Pulses: Normal pulses.      Heart sounds: Normal heart sounds.   Pulmonary:      Effort: Pulmonary effort is normal.      Breath sounds: Normal breath sounds.   Abdominal:      General: Bowel sounds are normal.      Palpations: Abdomen is soft.      Tenderness: There is no abdominal tenderness. There is no guarding or rebound.      Comments: Well healed surgical scar   Musculoskeletal:      Cervical back: Neck supple.      Right lower leg: No edema.      Left lower leg: No edema.   Skin:     General: Skin is warm and dry.   Neurological:      General: No focal deficit present.  Mental Status: She is alert.   Psychiatric:         Behavior: Behavior normal.            Assessment/Plan:      The 10-year ASCVD risk score (Arnett DK, et al., 2019) is: 19%    Values used to calculate the score:      Age: 89 years      Sex: Female      Is Non-Hispanic African American: No      Diabetic: No      Tobacco smoker: No      Systolic Blood Pressure: 130 mmHg      Is BP treated: Yes      HDL Cholesterol: 72 mg/dL      Total Cholesterol: 168 mg/dL    Lyndsi was seen today for medicare annual wellness visit.    Diagnoses and all orders for this visit:    Preventative health care    Essential  hypertension  -     Comprehensive metabolic panel  -     Lipid panel    Osteoporosis without current pathological fracture, unspecified osteoporosis type  -     CBC and differential  -     Comprehensive metabolic panel  -     DXA Bone Density Axial Skeleton; Future    Postmenopausal  -     DXA Bone Density Axial Skeleton; Future    Need for pneumococcal vaccination  -     Pneumococcal Conjugate 20 - Valent    Abnormal finding of blood chemistry, unspecified  -     CBC and differential    Other orders  -     Hemolysis index  -     losartan (COZAAR) 50 MG tablet; Take 1.5 tablets (75 mg) by mouth daily  -     atorvastatin (LIPITOR) 10 MG tablet; Take 1 tablet (10 mg) by mouth daily      Preventative care: Greatest 10 year health risk is cardiovascular disease, recommend regular exercise at least 150 minutes/week and heart healthy diet (I.e. more plant-based, Mediterranean diet).  Immunizations: recommend high dose influenza and COVID-19 vaccination annually.  Already did RSV vaccination at pharmacy  Colonoscopy (45-75): scheduled Oct 2023  Mammogram (40-74): done this morning, result pending  DEXA:                         [  ] follows gyn   [  ] address future visit  [ x ] ordered    Hypertension: Recommend more plant based, low salt diet.  Recommend checking home blood pressures.  On losartan 75 mg daily, may uptitrate in the future if blood pressure remains elevated.  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  On atorvastatin 10 mg daily  Osteoporosis: Recommend dietary calcium intake, vitamin D, weight bearing exercises, minimize alcohol.  On risedronate 150 mg monthly.  Consider DEXA every 2 years.  May follow up with endocrinology in the future          Follow-up:    Return in about 6 months (around 09/30/2022) for hypertension.         Concha Norway, MD

## 2022-04-01 NOTE — PSS Phone Screening (Signed)
03/31/22: Pcp lov note, Cbc,Cmp -in Epic.

## 2022-04-06 ENCOUNTER — Ambulatory Visit
Admission: RE | Admit: 2022-04-06 | Discharge: 2022-04-06 | Disposition: A | Discharge status: Home / Self Care | Payer: Medicare Other | Source: Ambulatory Visit | Attending: Internal Medicine | Admitting: Internal Medicine

## 2022-04-06 DIAGNOSIS — Z780 Asymptomatic menopausal state: Secondary | ICD-10-CM | POA: Insufficient documentation

## 2022-04-06 DIAGNOSIS — M810 Age-related osteoporosis without current pathological fracture: Secondary | ICD-10-CM | POA: Insufficient documentation

## 2022-04-08 ENCOUNTER — Encounter
Admission: RE | Disposition: A | Discharge status: Home / Self Care | Payer: Self-pay | Source: Ambulatory Visit | Attending: Gastroenterology

## 2022-04-08 ENCOUNTER — Ambulatory Visit: Payer: Medicare Other | Admitting: Anesthesiology

## 2022-04-08 ENCOUNTER — Ambulatory Visit
Admission: RE | Admit: 2022-04-08 | Discharge: 2022-04-08 | Disposition: A | Discharge status: Home / Self Care | Payer: Medicare Other | Source: Ambulatory Visit | Attending: Gastroenterology | Admitting: Gastroenterology

## 2022-04-08 ENCOUNTER — Encounter: Payer: Self-pay | Admitting: Gastroenterology

## 2022-04-08 ENCOUNTER — Ambulatory Visit: Payer: Self-pay

## 2022-04-08 DIAGNOSIS — D120 Benign neoplasm of cecum: Secondary | ICD-10-CM | POA: Insufficient documentation

## 2022-04-08 DIAGNOSIS — I10 Essential (primary) hypertension: Secondary | ICD-10-CM | POA: Insufficient documentation

## 2022-04-08 DIAGNOSIS — K219 Gastro-esophageal reflux disease without esophagitis: Secondary | ICD-10-CM | POA: Insufficient documentation

## 2022-04-08 DIAGNOSIS — Z86010 Personal history of colonic polyps: Secondary | ICD-10-CM | POA: Insufficient documentation

## 2022-04-08 DIAGNOSIS — E785 Hyperlipidemia, unspecified: Secondary | ICD-10-CM

## 2022-04-08 DIAGNOSIS — K760 Fatty (change of) liver, not elsewhere classified: Secondary | ICD-10-CM | POA: Insufficient documentation

## 2022-04-08 DIAGNOSIS — I700 Atherosclerosis of aorta: Secondary | ICD-10-CM | POA: Insufficient documentation

## 2022-04-08 DIAGNOSIS — K648 Other hemorrhoids: Secondary | ICD-10-CM | POA: Insufficient documentation

## 2022-04-08 DIAGNOSIS — K5730 Diverticulosis of large intestine without perforation or abscess without bleeding: Secondary | ICD-10-CM | POA: Insufficient documentation

## 2022-04-08 DIAGNOSIS — Z09 Encounter for follow-up examination after completed treatment for conditions other than malignant neoplasm: Secondary | ICD-10-CM | POA: Insufficient documentation

## 2022-04-08 HISTORY — PX: COLONOSCOPY: SHX174

## 2022-04-08 HISTORY — PX: COLONOSCOPY, DIAGNOSTIC (SCREENING): SHX174

## 2022-04-08 SURGERY — DONT USE, USE 1094-COLONOSCOPY, DIAGNOSTIC (SCREENING)
Anesthesia: Anesthesia General | Site: Anus

## 2022-04-08 MED ORDER — PROPOFOL 10 MG/ML IV EMUL (WRAP)
INTRAVENOUS | Status: DC | PRN
Start: 2022-04-08 — End: 2022-04-08
  Administered 2022-04-08: 70 mg via INTRAVENOUS
  Administered 2022-04-08 (×2): 20 mg via INTRAVENOUS

## 2022-04-08 MED ORDER — LACTATED RINGERS IV SOLN
INTRAVENOUS | Status: DC
Start: 2022-04-08 — End: 2022-04-08

## 2022-04-08 MED ORDER — LIDOCAINE HCL 2 % IJ SOLN
INTRAMUSCULAR | Status: DC | PRN
Start: 2022-04-08 — End: 2022-04-08
  Administered 2022-04-08: 60 mg via INTRAVENOUS

## 2022-04-08 MED ORDER — LIDOCAINE HCL (PF) 2 % IJ SOLN
INTRAMUSCULAR | Status: AC
Start: 2022-04-08 — End: ?
  Filled 2022-04-08: qty 5

## 2022-04-08 MED ORDER — PROPOFOL 10 MG/ML IV EMUL (WRAP)
INTRAVENOUS | Status: AC
Start: 2022-04-08 — End: ?
  Filled 2022-04-08: qty 40

## 2022-04-08 SURGICAL SUPPLY — 21 items
GLOVE EXAM SMALL COLLOIDAL OATMEAL (Glove) ×1
GLOVE EXAM SMALL COLLOIDAL OATMEAL NITRILE L9.5 IN CHEMOTHERAPY POWDER (Glove) ×1 IMPLANT
GLOVE EXAM SMALL NITRILE CHEMO FINGER TEXTURE OATMEAL RESTORE (Glove) IMPLANT
GLOVE EXM CLLD OATMEAL NTR SM RSTR 9.5IN (Glove) ×1
KIT COMPLIANCE ENDOKIT OP4 CA 1.1+ 6FT (Procedure Accessories) ×1
KIT ENDOSCOPIC L6 FT CLEAN ADAPTER (Procedure Accessories) ×1
KIT ENDOSCOPIC L6 FT CLEAN ADAPTER COMPLIANCE ENDOKIT ORCAPOD 4 1.1 OZ (Procedure Accessories) ×1 IMPLANT
SNARE ESCP RND CPTVTR 2.4MM 240CM CLD (Procedure Accessories) ×1
SNARE OD10 MM CAPTIVATOR ENDOSCOPIC POLYPECTOMY (Procedure Accessories) IMPLANT
SPONGE GAUZE L4 IN X W4 IN 16 PLY (Dressing) ×1
SPONGE GAUZE L4 IN X W4 IN 16 PLY MAXIMUM ABSORBENT USP TYPE VII (Dressing) ×1 IMPLANT
SPONGE GZE CTTN CRTY 4X4IN LF NS 16 PLY (Dressing) ×1
SYRINGE 50 ML GRADUATE NONPYROGENIC DEHP (Syringes, Needles) ×1
SYRINGE 50 ML GRADUATE NONPYROGENIC DEHP FREE PVC FREE BD MEDICAL (Syringes, Needles) IMPLANT
SYRINGE MED 50ML LF STRL GRAD N-PYRG (Syringes, Needles) ×1
TRAP SPEC REM ETRAP 15CM LF STRL MAGNIFY (Procedure Accessories) ×1
TRAP SPECIMEN REMOVAL L15 CM MAGNIFY (Procedure Accessories) ×1
TRAP SPECIMEN REMOVAL L15 CM MAGNIFY WINDOW MEASUREMENT GUIDE ETRAP (Procedure Accessories) IMPLANT
WATER STERILE PLASTIC POUR BOTTLE 1000 (Irrigation Solutions) ×1
WATER STERILE PLASTIC POUR BOTTLE 1000 ML (Irrigation Solutions) IMPLANT
WATER STRL 1000ML LF PLS PR BTL (Irrigation Solutions) ×1

## 2022-04-08 NOTE — Discharge Instr - AVS First Page (Addendum)
Endoscopy Discharge Instructions  General Instructions:  1. Following sedation, your judgement, perception, and coordination are considered impaired. Even though you may feel awake and alert, you are considered legally intoxicated. Therefore, until the next morning;  Do not Drive  Do not operate appliances or equipment that requires reaction time (e.g. stove, electrical tools, machinery)  Do not sign legal documents or be involved in important decisions.  Do not smoke if alone  Do not drink alcoholic beverages  Go directly home and rest for several hours before resuming your routine activities.  It is highly recommended to have a responsible adult stay with you for the next 24 hours    2. Tenderness, swelling or pain may occur at the IV site where you received sedation. If you experience this, apply warm soaks to the area. Notify your physician if this persists.    Instructions Specific To Procedures - Report To Physician Any Of The Following:  Colon/Sigmoidoscopy/Proctoscopy   1. Severe and persistent abdominal pain/bloating which does not subside within   2-3 hours   2. Large amount of rectal bleeding (some mucosal blood streaking may occur,   especially if biopsy or polypectomy was done or if hemorrhoids are present.   3. Nausea/vomitting   4. Fevers/Chills within 24 hours after procedure. Temp>101deg F     In Addition:   If polyp has been removed, DO NOT take aspirin or aspirin containing products   (e.g. Anacin, Alka Seltzer, Bufferin, Etc.) or non-steroidal anti-inflammatory drugs   (e.g. Advil, Motrin, etc.) for 2-3 days unless otherwise advised by doctor. Tylenol   or extra Strength Tylenol is permitted.    Additional Discharge Instructions  Your diet after the procedure: Avoid heavy/fatty/greasy/spicy/fried food for 24 hours.  Start with easily digestible food like soup/crackers.        If you have questions or problems contact your MD immediately. If you need immediate attention, call your MD, 911 and/or  go to nearest emergency room.

## 2022-04-08 NOTE — Transfer of Care (Signed)
Anesthesia Transfer of Care Note    Patient: Aimee Wade    Procedures performed: Procedure(s):  COLONOSCOPY    Anesthesia type: General TIVA    Patient location:Phase II PACU    Last vitals:   Vitals:    04/08/22 1241   BP: 134/79   Pulse: 71   Resp: 18   Temp: 36.9 C (98.4 F)   SpO2: 95%       Post pain: Patient not complaining of pain, continue current therapy      Mental Status:awake and alert     Respiratory Function: tolerating room air    Cardiovascular: stable    Nausea/Vomiting: patient not complaining of nausea or vomiting    Hydration Status: adequate    Post assessment: no apparent anesthetic complications, no reportable events and no evidence of recall    Signed by: Netta Neat, CRNA  04/08/22 1:53 PM

## 2022-04-08 NOTE — Anesthesia Preprocedure Evaluation (Signed)
Anesthesia Evaluation    AIRWAY    Mallampati: II    TM distance: >3 FB  Neck ROM: full  Mouth Opening:full   CARDIOVASCULAR    cardiovascular exam normal       DENTAL    no notable dental hx               PULMONARY    pulmonary exam normal and clear to auscultation     OTHER FINDINGS                                      Relevant Problems   ANESTHESIA (within normal limits)      PULMONARY (within normal limits)      NEURO/PSYCH (within normal limits)      CARDIO   (+) Aortic atherosclerosis   (+) Essential hypertension      GI   (+) Gastroesophageal reflux disease      GU/RENAL   (+) Hepatic steatosis      ENDO (within normal limits)               Anesthesia Plan    ASA 3     general                     intravenous induction   Detailed anesthesia plan: general IV      Post Op:  Trial extubation is not planned.      informed consent obtained    Plan discussed with CRNA.    ECG reviewed               Signed by: Lezlie Lye, MD 04/08/22 1:03 PM

## 2022-04-08 NOTE — Anesthesia Postprocedure Evaluation (Signed)
Anesthesia Post Evaluation    Patient: Aimee Wade    Procedure(s):  COLONOSCOPY    Anesthesia type: general    Last Vitals:   Vitals Value Taken Time   BP 146/82 04/08/22 1404   Temp 97.8 04/08/22 1406   Pulse 70 04/08/22 1405   Resp 14 04/08/22 1406   SpO2 96 % 04/08/22 1405   Vitals shown include unvalidated device data.              Anesthesia Post Evaluation:     Patient Evaluated: PACU    Level of Consciousness: awake and alert    Pain Management: adequate    Airway Patency: patent        Anesthetic complications: No      PONV Status: none    Cardiovascular status: acceptable  Respiratory status: acceptable  Hydration status: acceptable          Signed by: Lezlie Lye, MD, 04/08/2022 2:06 PM

## 2022-04-08 NOTE — H&P (Signed)
GI PRE PROCEDURE NOTE    Proceduralist Comments:   Review of Systems and Past Medical / Surgical History performed: Yes     Indications:History of colonic polyps    Previous Adverse Reaction to Anesthesia or Sedation (if yes, describe): No    Physical Exam / Laboratory Data (If applicable)   Airway Classification: Class II    General: Alert and cooperative  Lungs: Lungs clear to auscultation  Cardiac: RRR, normal S1S2.    Abdomen: Soft, non tender. Normal active bowel sounds  Other:     No results found for this or any previous visit (from the past 24 hour(s)).    Microbiology:     American Society of Anesthesiologists (ASA) Physical Status Classification:   ASA 3 - Patient with moderate systemic disease with functional limitations    Planned Sedation:   Deep sedation with anesthesia    Attestation:   STEPHANIEMARIE STOFFEL has been reassessed immediately prior to the procedure and is an appropriate candidate for the planned sedation and procedure. Risks, benefits and alternatives to the planned procedure and sedation have been explained to the patient or guardian:  yes        Signed by: Huntley Estelle, MD

## 2022-04-09 ENCOUNTER — Encounter: Payer: Self-pay | Admitting: Gastroenterology

## 2022-04-10 LAB — LAB USE ONLY - HISTORICAL SURGICAL PATHOLOGY

## 2022-04-10 NOTE — Progress Notes (Unsigned)
Subjective:      Patient ID: Aimee Wade is a 76 y.o. female     Chief Complaint:  1) Nocturia  2) Frequent UTI  3) Urinary incontinence    01/19/2022 Dr. 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  Estrace vaginal cream uses twice a month due to discomfort after, she reported that she has bothersome urinary symptoms when she uses the cream  Pelvic PT- she did not started, has problems with scheduling and insurance.She does kegels by herself and positional voiding and it helps with urinary incontinence.  RUS 01/29/2022: solitary right kidney, no hydronephrosis, PVR 81 cc      Today: no dysuria, no bladder pain, no pelvic pain, no urgency and frequency and no fever.  She is asymptomatic. She still has Bactrim Dr. Dimple Casey gave her just in case she develops symptoms.  Nocturia Q2 hours. Bedtime at 9:30 pm, last liquid at 8 pm water  She had colonoscopy last week- constipation on and off, last BM today  She snores    She reported that she uses scented and with lotion toilet paper for years    The following portions of the patient's history were reviewed and updated as appropriate: allergies, current medications, past family history, past  medical history, past social history, past surgical history and problem list.    Review of Systems  As per HPI      Objective:   BP 129/82 (BP Site: Left arm, Patient Position: Sitting, Cuff Size: Medium)   Pulse 68   Temp 97.6 F (36.4 C) (Oral)   Ht 1.63 m (5' 4.17")   Wt 72.6 kg (160 lb)   BMI 27.32 kg/m   General appearance - alert, well appearing, and in no distress  Mental status - alert, oriented to person, place, and time, appropriate affect  Skin: Normal temperature, turgor and texture; no rash or ulcers  Head: Normocephalic, Atraumatic, EOMI, normal dentition           Lab Review      Component  Ref Range & Units 04/13/22 10:15 AM 01/19/22  9:41 AM   POCT Urine Color  Yellow Yellow  Dark yellow    POCT Urine Clarity  Clear - Hazy Cloudy Abnormal   Cloudy Abnormal     POCT Urine pH  5.0 - 8.0 5.5  6.0    Urine leukocyte Esterase, POCT  Negative Large Abnormal   Large Abnormal     POCT Urine Nitrites  Negative Positive Abnormal   Positive Abnormal     Protein, UR POCT  Negative mg/dL 30 Abnormal   30 Abnormal  POCT Urine Glucose  Negative mg/dL Negative  Negative    POCT Urine Ketones  Negative mg/dL Negative  Negative    POCT Urine Urobilibogen  0.0 - 1.0 mg/dL 0.2  0.2    POCT Urine Bilirubin  Negative Negative  Negative    Blood, UA POCT  Negative Moderate Abnormal   Moderate Abnormal     Urine Specific Gravity POC  1.001 - 1.035 1.015         Radiology Review   Results for orders placed during the hospital encounter of 01/29/22    US Renal Kidney Bladder Complete    Narrative  HISTORY: Right-sided flank pain. Solitary right kidney.    COMPARISON: None.    FINDINGS:  Renal Measurements (long axis):  Right kidney: 12.3 cm  Left kidney: Status post nephrectomy.    Right kidney: Parenchymal echogenicity and thickness are normal. No  hydronephrosis. No solid renal mass. No sonographic evidence of renal  calculi.    Incidentally noted is hepatic steatosis and cholelithiasis.    Bladder: The bladder is  imaged at a volume of 86 cc. The wall thickness is  normal for the degree of distention. No mass, calculi, or diverticula.  Post-void residual 81 cc after the second attempt. There is a right  ureteral jet.    Impression  1. 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 There is no hydronephrosis, discrete  solid mass or discernible calculus within the kidney.  2. Limited evaluation of the urinary bladder with no gross abnormality.  There is a post void residual of 81 cc after the second attempt.  3. Incidentally noted is hepatic steatosis and cholelithiasis.    Stephannie Peters, MD  01/29/2022 9:46 AM         Assessment:     1. Suspected UTI    2. Mixed urge and stress incontinence    3. Dysuria    4. Acquired solitary kidney    5. Nocturia        Urine test shows infection, urine culture was ordered.  Patient is asymptomatic today.    Patient was instructed to continue supplements and behavioral changes - listed below , to help prevent future infections.    Management and care for this patient was reviewed with the supervising physician.          Plan:     Patient Instructions   Timed voiding: urinate every 2-3 hours during the day or sooner if needed  Positional voiding (sit straight up at the end of voiding then lean forward to help empty your bladder)  Continue D-mannose, Probiotic and Cranberry pills daily  Drink plenty of water, at least 2 liters daily  5.. Restrict fluids in the evening ( coffee, caffeinated beverages, and alcohol). Cut liquids 2 hours before bedtime.  6. Elevate the legs in the afternoon and evening (helps prevent fluid accumulation).   7. Double urination before bedtime: urinate then leave the bathroom and in 5- 10 minutes go back and try to pee again  8. Contact your Primary Care Doctor and ask for sleep study to rule out sleep apnea  9. You can use coconut oil around your vaginal area  10. Please stop to use scented toilet paper              Orders  Orders Placed This Encounter   Procedures    Urine  culture     Order Specific Question:   Release to patient     Answer:  Immediate    Urinalysis POC    Microscopic, Urine

## 2022-04-13 ENCOUNTER — Other Ambulatory Visit (INDEPENDENT_AMBULATORY_CARE_PROVIDER_SITE_OTHER): Payer: Self-pay | Admitting: Internal Medicine

## 2022-04-13 ENCOUNTER — Encounter (INDEPENDENT_AMBULATORY_CARE_PROVIDER_SITE_OTHER): Payer: Self-pay | Admitting: Nurse Practitioner

## 2022-04-13 ENCOUNTER — Encounter (INDEPENDENT_AMBULATORY_CARE_PROVIDER_SITE_OTHER): Payer: Self-pay | Admitting: Internal Medicine

## 2022-04-13 ENCOUNTER — Ambulatory Visit (INDEPENDENT_AMBULATORY_CARE_PROVIDER_SITE_OTHER): Payer: Medicare Other | Admitting: Nurse Practitioner

## 2022-04-13 VITALS — BP 129/82 | HR 68 | Temp 97.6°F | Ht 64.17 in | Wt 160.0 lb

## 2022-04-13 DIAGNOSIS — N3946 Mixed incontinence: Secondary | ICD-10-CM

## 2022-04-13 DIAGNOSIS — R351 Nocturia: Secondary | ICD-10-CM

## 2022-04-13 DIAGNOSIS — R3989 Other symptoms and signs involving the genitourinary system: Secondary | ICD-10-CM

## 2022-04-13 DIAGNOSIS — Z905 Acquired absence of kidney: Secondary | ICD-10-CM

## 2022-04-13 DIAGNOSIS — R300 Dysuria: Secondary | ICD-10-CM

## 2022-04-13 DIAGNOSIS — D126 Benign neoplasm of colon, unspecified: Secondary | ICD-10-CM | POA: Insufficient documentation

## 2022-04-13 LAB — URINALYSIS POCT
POCT Urine Bilirubin: NEGATIVE
POCT Urine Glucose: NEGATIVE mg/dL
POCT Urine Ketones: NEGATIVE mg/dL
POCT Urine Nitrites: POSITIVE — AB
POCT Urine Urobilibogen: 0.2 mg/dL (ref 0.0–1.0)
POCT Urine pH: 5.5 (ref 5.0–8.0)
Protein, UR POCT: 30 mg/dL — AB
Urine Specific Gravity POC: 1.015 (ref 1.001–1.035)

## 2022-04-13 NOTE — Patient Instructions (Addendum)
Timed voiding: urinate every 2-3 hours during the day or sooner if needed  Positional voiding (sit straight up at the end of voiding then lean forward to help empty your bladder)  Continue D-mannose, Probiotic and Cranberry pills daily  Drink plenty of water, at least 2 liters daily  5.. Restrict fluids in the evening ( coffee, caffeinated beverages, and alcohol). Cut liquids 2 hours before bedtime.  6. Elevate the legs in the afternoon and evening (helps prevent fluid accumulation).   7. Double urination before bedtime: urinate then leave the bathroom and in 5- 10 minutes go back and try to pee again  8. Contact your Primary Care Doctor and ask for sleep study to rule out sleep apnea  9. You can use coconut oil around your vaginal area  10. Please stop to use scented toilet paper

## 2022-04-14 LAB — URINE MICROSCOPIC

## 2022-04-16 ENCOUNTER — Telehealth (INDEPENDENT_AMBULATORY_CARE_PROVIDER_SITE_OTHER): Payer: Self-pay | Admitting: Nurse Practitioner

## 2022-04-16 NOTE — Telephone Encounter (Signed)
Pt is returning MA Keiry's call regarding lab results.     Thank you!

## 2022-05-06 ENCOUNTER — Encounter (INDEPENDENT_AMBULATORY_CARE_PROVIDER_SITE_OTHER): Payer: Self-pay | Admitting: Internal Medicine

## 2022-05-06 DIAGNOSIS — I10 Essential (primary) hypertension: Secondary | ICD-10-CM

## 2022-05-06 HISTORY — DX: Essential (primary) hypertension: I10

## 2022-07-14 ENCOUNTER — Ambulatory Visit (INDEPENDENT_AMBULATORY_CARE_PROVIDER_SITE_OTHER): Payer: Medicare Other | Admitting: Nurse Practitioner

## 2022-07-28 ENCOUNTER — Ambulatory Visit (INDEPENDENT_AMBULATORY_CARE_PROVIDER_SITE_OTHER): Payer: Medicare Other | Admitting: Nurse Practitioner

## 2022-07-28 ENCOUNTER — Encounter (INDEPENDENT_AMBULATORY_CARE_PROVIDER_SITE_OTHER): Payer: Self-pay | Admitting: Nurse Practitioner

## 2022-07-28 VITALS — BP 127/81 | HR 74 | Ht 65.35 in | Wt 162.0 lb

## 2022-07-28 DIAGNOSIS — N393 Stress incontinence (female) (male): Secondary | ICD-10-CM

## 2022-07-28 DIAGNOSIS — R339 Retention of urine, unspecified: Secondary | ICD-10-CM

## 2022-07-28 DIAGNOSIS — R351 Nocturia: Secondary | ICD-10-CM

## 2022-07-28 DIAGNOSIS — R3989 Other symptoms and signs involving the genitourinary system: Secondary | ICD-10-CM

## 2022-07-28 DIAGNOSIS — Z905 Acquired absence of kidney: Secondary | ICD-10-CM

## 2022-07-28 LAB — URINALYSIS POCT
POCT Urine Bilirubin: NEGATIVE
POCT Urine Glucose: NEGATIVE mg/dL
POCT Urine Ketones: NEGATIVE mg/dL
POCT Urine Nitrites: POSITIVE — AB
POCT Urine Urobilibogen: 0.2 mg/dL (ref 0.0–1.0)
POCT Urine pH: 5.5 (ref 5.0–8.0)
Urine Specific Gravity POC: 1.015 (ref 1.001–1.035)

## 2022-07-28 LAB — URINE MICROSCOPIC

## 2022-07-28 NOTE — Progress Notes (Signed)
Subjective:      Patient ID: Aimee Wade is a 77 y.o. female     Chief Complaint:       1) Nocturia  2) Frequent UTI  3) Urinary incontinence     01/19/2022 Dr. 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  Estrace vaginal cream uses twice a month due to discomfort after, she reported that she has bothersome urinary symptoms when she uses the cream  Pelvic PT- she did not started, has problems with scheduling and insurance.She does kegels by herself and positional voiding and it helps with urinary incontinence.  RUS 01/29/2022: solitary right kidney, no hydronephrosis, PVR 81 cc  Today: no dysuria, no bladder pain, no pelvic pain, no urgency and frequency and no fever.  She is asymptomatic. She still has Bactrim Dr. Benjamine Mola gave her just in case she develops symptoms.  Nocturia Q2 hours. Bedtime at 9:30 pm, last liquid at 8 pm water  She had colonoscopy last week- constipation on and off, last BM today  She snores     She reported that she uses scented and with lotion toilet paper for years       TODAY 07/28/2022  She is here for 3 months follow up. No uti since last visit.  Takes daily D-Mannose, Probiotic, and cranberry pills  Nocturia Q2  hours, bedtime at 9 -10 pm, last liquid 7:30 pm( water)  She did not have Sleep study   Today: no dysuria, no hematuria, but she Reported bladder fullness/ pain and fatigue  She still leaks urine with cough, sneeze-never scheduled pelvic PT  She wears diapers 3x daily  Sometimes constipated- BM daily( sometimes she has to strain)  She stop Estrace vaginal cream 2 months ago due to vaginal itching and burning    The following portions of the patient's history were reviewed and updated as appropriate: allergies, current medications, past family history, past medical history, past social history, past surgical history and problem list.    Review of Systems  As per HPI      Objective:   BP 127/81 (BP Site: Left arm, Patient Position: Sitting, Cuff Size: Medium)   Pulse 74   Ht 1.66 m (5' 5.35")   Wt 73.5 kg (162 lb)   BMI 26.67 kg/m   General appearance - alert, well appearing, and in no distress  Mental status - alert, oriented to person, place, and time, appropriate affect  Skin: Normal temperature, turgor and texture; no rash or ulcers     PVR 120 ml    Lab Review        Radiology Review   Results  for orders placed during the hospital encounter of 01/29/22    US Renal Kidney Bladder Complete    Narrative  HISTORY: Right-sided flank pain. Solitary right kidney.    COMPARISON: None.    FINDINGS:  Renal Measurements (long axis):  Right kidney: 12.3 cm  Left kidney: Status post nephrectomy.    Right kidney: Parenchymal echogenicity and thickness are normal. No  hydronephrosis. No solid renal mass. No sonographic evidence of renal  calculi.    Incidentally noted is hepatic steatosis and cholelithiasis.    Bladder: The bladder is imaged at a volume of 86 cc. The wall thickness is  normal for the degree of distention. No mass, calculi, or diverticula.  Post-void residual 81 cc after the second attempt. There is a right  ureteral jet.    Impression  1. 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 There is no hydronephrosis,  discrete  solid mass or discernible calculus within the kidney.  2. Limited evaluation of the urinary bladder with no gross abnormality.  There is a post void residual of 81 cc after the second attempt.  3. Incidentally noted is hepatic steatosis and cholelithiasis.    Burnis Medin, MD  01/29/2022 9:46 AM         Assessment:     1. SUI (stress urinary incontinence, female)    2. Acquired solitary kidney    3. Incomplete bladder emptying    4. Nocturia    5. Bladder pain      Urinalysis shows infection, urine culture was ordered. Patient has bladder pain and fatigue, but prefers to wait with tx until ucx results.    PVR 120 ml-behavioral changes were discussed such as timed, double, and positional voiding to help empty her bladder better.    Regarding nocturia- she was advised to see PCP to discuss sleep study to rule out sleep apnea.    Pelvic PT order was given again today.    Management and care for this patient was reviewed with the supervising physician.        Plan:     Patient Instructions   Please check with PCP to discuss sleep study to rule out sleep apnea  Timed voiding: urinate every 2-3 hours during the day  Double voiding: urinate then 5 minutes after urinate again during the day  Positional voiding (sit straight up at the end of voiding then lean forward to help empty your bladder)   Please schedule pelvic floor physical therapy  Pelvic Floor Physical Therapy Providers     [facility]  [address] , [place] [address]   Thomas Ronayne / Sherrin Daisy / Cherlynn Perches  Ph: [phone]  Fax: [phone]     [facility]   [address] [place], [place] [address]   Rogelia Mire Ph:  [facility]  [place]. [address] [place], [place] [address]   Collins Scotland / Arliss Journey / Garnette Scheuermann                Ph: [phone]     [facility]  [address]  [place], [place] [address]   Axel Filler Ph: [phone]:  [phone]     [facility]  [address] [place], [place] [address]   Timoteo Expose Ph: [phone]     [facility]  [address], [place] [address]   Glori Bickers, PT, DPT Ph: [phone]:  [phone]     [facility]  [address],  [place] [address]   Dr Herby Abraham, DFT Ph: [phone]     [facility] Locations)  [address], [place] [address]   Mary Nalls / Lemont Fillers / Pete Glatter / Durwin Glaze / Lindwood Coke    Ph: [phone] Fax: [phone]     [phone] [facility] [address] [place], [place] [address]   Evelena Peat / Lemont Fillers / Pete Glatter / Sanjuana Kava / Drema Halon / Otilio Connors   Ph: [phone] Fax: [phone]     [facility] (2 locations)  [address], [place] [address]   Amparo Bristol, [place], PT  Ph: [phone]: [phone]     [place], [place] [address]   Nestor Lewandowsky, DPT / Varney Biles, DPT   Ph: [phone] Fax: [phone]     [facility]  [address], [place], [place] [address]   Ph: [phone]  [email or web address]   Offers in home physical therapy          Orders  Orders Placed This Encounter   Procedures    Urine culture     Order Specific Question:   Release to patient     Answer:   Immediate    Urinalysis POC    Microscopic, Urine    Ambulatory referral to Physical Therapy     Standing Status:   Future     Standing Expiration Date:   07/29/2023     Referral Priority:   Routine     Referral Type:   Consultation     Referral Reason:   Specialty Services Required     Requested Specialty:   Physical Therapy     Number of Visits Requested:   1

## 2022-07-28 NOTE — Patient Instructions (Addendum)
Please check with PCP to discuss sleep study to rule out sleep apnea  Timed voiding: urinate every 2-3 hours during the day  Double voiding: urinate then 5 minutes after urinate again during the day  Positional voiding (sit straight up at the end of voiding then lean forward to help empty your bladder)   Please schedule pelvic floor physical therapy  Pelvic Floor Physical Therapy Providers     [facility]  [address] , [place] [address]   Thomas Ronayne / Sherrin Daisy / Cherlynn Perches  Ph: [phone]  Fax: [phone]     [facility]   [address] [place], [place] [address]   Rogelia Mire Ph:  [facility]  [place]. [address] [place], [place] [address]   Collins Scotland / Arliss Journey / Garnette Scheuermann                Ph: [phone]     [facility]  [address]  [place], [place] [address]   Axel Filler Ph: [phone]: [phone]     [facility]  [address] [place], [place] [address]   Timoteo Expose Ph: [phone]     [facility]  [address], [place] [address]   Glori Bickers, PT, DPT Ph: [phone]: [phone]     [facility]  [address] [address]   Dr Herby Abraham, DFT Ph: [phone]     [facility] ([facility] Locations)  [address], [place] [address]   Mary Nalls / Lemont Fillers / [facility] / Durwin Glaze / Lindwood Coke    Ph: [phone] Fax: [phone]     [phone] [facility] [address] [place], [place] [address]   Evelena Peat / Lemont Fillers / Pete Glatter / Sanjuana Kava / Drema Halon / Otilio Connors   Ph: [phone] Fax: [phone]     [facility] (2 locations)  [address], [place] [address]   Amparo Bristol, [place], PT  Ph: [phone]: [phone]     [place], [place] [address]   Nestor Lewandowsky, DPT / Varney Biles, DPT   Ph: [phone] Fax:  [phone]     [facility]  [address], [place],  [address]   Ph: [phone]  [email or web address]   Offers in home physical therapy

## 2022-07-31 ENCOUNTER — Telehealth (INDEPENDENT_AMBULATORY_CARE_PROVIDER_SITE_OTHER): Payer: Self-pay | Admitting: Nurse Practitioner

## 2022-07-31 MED ORDER — CEFDINIR 300 MG PO CAPS
300.0000 mg | ORAL_CAPSULE | Freq: Two times a day (BID) | ORAL | 0 refills | Status: AC
Start: 2022-07-31 — End: 2022-08-07

## 2022-07-31 NOTE — Telephone Encounter (Signed)
07/30/22   >100,000 CFU/ML Klebsiella pneumoniae , pt is symptomatic.    Cefdinir was prescribed.

## 2022-08-10 ENCOUNTER — Encounter (INDEPENDENT_AMBULATORY_CARE_PROVIDER_SITE_OTHER): Payer: Self-pay | Admitting: Nurse Practitioner

## 2022-08-17 ENCOUNTER — Telehealth (INDEPENDENT_AMBULATORY_CARE_PROVIDER_SITE_OTHER): Payer: Self-pay | Admitting: Nurse Practitioner

## 2022-08-17 NOTE — Telephone Encounter (Signed)
Called pt. Patient reported that she feels great and has no more cloudy urine.

## 2022-08-21 ENCOUNTER — Other Ambulatory Visit (INDEPENDENT_AMBULATORY_CARE_PROVIDER_SITE_OTHER): Payer: Self-pay | Admitting: Internal Medicine

## 2022-08-24 ENCOUNTER — Ambulatory Visit
Admission: RE | Admit: 2022-08-24 | Discharge: 2022-08-24 | Disposition: A | Discharge status: Home / Self Care | Payer: Medicare Other | Source: Ambulatory Visit | Attending: Nurse Practitioner | Admitting: Nurse Practitioner

## 2022-08-24 DIAGNOSIS — R102 Pelvic and perineal pain: Secondary | ICD-10-CM | POA: Insufficient documentation

## 2022-08-24 NOTE — Plan of Care (Signed)
[place]  [address], [place], [place] [address]  P: [phone]  F: [phone]      Patient: Aimee Wade       MRN: [id number]   Date of Birth: Jun 29, 1946  Age: 77 y.o.    Physician Agreement of Patient Plan of Care:   Date: 08/24/2022    Dear Verta Ellen, NP,     Thank you for allowing [place] to participate in the care of Aimee Wade. Enclosed is the Plan of Care for Physical Therapy for this patient.     Regulations from the Center for Medicare and Medicaid Services (CMS) require your review and approval of this plan of care. Please note that since a prescription or order for therapy does not legally constitute approval of plan of care, we will not be able to proceed with therapy until we receive your approval of the plan of care.       Please co-sign this document (if you are an Teacher, English as a foreign language) or print, sign, and date the Physician Certification Statement below and fax this document back to [place] within 3 business days of receipt so we can initiate the therapy treatment plan.     Sincerely,     [place]            Physician Certification  I have read and agreed with the plan of care for the above named patient who is under my care.     Medical Diagnosis: N 39.3 Stress Incontinence     Therapy Diagnosis: decreased bladder control, decreased bowel control, pelvic pain     Frequency: 1 times per week for 8 visits    Certification Period: 08/24/2022 - 11/22/2022  Therapeutic Interventions Planned: [id number] PT Re-Evaluation, 97110 Therapeutic Exercises, to include home exercise program., [id number] Manual Therapy, [id number] Therapeutic Activities, to include functional mobility training., [phone] Biofeedback PUG Initial 15 min, and 90913 Biofeedback PUG Each Addl 15 min     Physician Signature:  ________________________________ Date: __________    Physician NPI: _____________________________    Please fax completed document to [facility], [place] at [phone]      Treatment Plan:     Patient Goal(s): Improve bladder control     Short Term Therapy Goals: x 7 visits    Demonstrate correct technique of PFM contraction exercise for proper strengthening of pelvic floor muscles in order to reduce bowel and bladder incontinence.  2. Decrease urinary incontinence from currently daily to 3x/week  3. Decrease frequency of bowel incontinence from once weekly to once every other week  4. Pt to report greater ease with passing stool with only min to no straining required majority of the time, per pt self-reporting   5. Pt to verbalize understanding of correct body mechanics for ADLs to allow pt to be as active as possible while decreasing urine leakage with ADLs  6. Min to no pelvic floor muscle tightness and/or tenderness on palpation to indicate decreasing muscle spasm responsible for causing pt's c/o pelvic pain     Long Term Therapy Goal(s)  x 8 visits    1. Independent with HEP and symptom management        Assessment: Aimee Wade is a 77 y.o. female presenting to out patient pelvic floor PT with pain, urinary and  bowel incontinence. On PT exam today, pt presents with significant abdominal soft tissue mobility restrictions and potential PFM tension and weakness (pt not ready for internal assessment this visit 2/2 fear of pain). Pt would benefit from continued skilled PT as indicated below:   Pelvic floor muscle training and strengthening, enhanced with cuing, RUSI, biofeedback and electrical stimulation as indicated     Develop personalized HEP for pelvic floor muscle strengthening    HEP of stretching exercises  Manual therapy including intravaginal and external associated muscle groups as indicated  Instruction in behavior modification for urinary urgency    Instruction  in pelvic prolapse/pelvic floor protection  Functional constipation tips and  techniques    Signature: Clayton Lefort, DPT

## 2022-08-24 NOTE — PT Eval Note (Addendum)
[facility]     Physical Therapy Pelvic Floor Evaluation     Patient: CRESSA DISSINGER    MRN#: [id number]     Authorization: [place] for Eval & Follow Ups     Referring MD:   Verta Ellen, NP     Date of evaluation: 08/24/2022    Time of treatment:  10:00-11:05    Treatment number: 1    Consult received for Payton Doughty for PT Evaluation and Treatment.  Patient's medical condition is appropriate for Physical therapy intervention at this time. PT is agreeable to physical therapy intervention at this time.     Medical Diagnosis:     N 39.3 Stress Incontinence    Rehab Diagnosis: decreased bladder control, decreased bowel control, pelvic pain    HPI:  77 y o post-menopausal F  presents to out patient PT with chief complaint of urinary incontinence. Pt reports frequent UTIs where symptoms are worsened.  Date/timeframe of onset of chief complaint:       3-4 years  Progression of sx:  mildly worsening      Subjective:   Pain Intensity on 0-10/10 pain scale and location(s) of pain: not currently but reports history of pain with internal exams, intercourse and use of vaginal applicator     Pt reports history of dyspareunia but no longer sexually active      Bladder Habits:  Urinary urgency and frequency while experiencing UTI   Diurnal void intervals: every two and half hours, every 15 mins with UTI  Nocturnal voids: 2x night regular basis, up to 4x w/UTI  Incontinence type: stress and urge with UTI  Frequency of incontinence episodes:  Daily    Size of accidents:  Small        Type of protection from accidents and amount used:  Adult diaper - 3/day   Bladder irritants:  1 cup regular tea otherwise decaf tea, cut out Lemonade and tomato products     Bowel Habits:   twice per day  Straining at stool?: at times  Time to complete BM:  yes  Normal urge for BM is present?: yes  BM incomplete vs complete sensation: incomplete at times  Type of stool of Bristol stool scale:  type 3-4, at times 6  Fiber intake:  metamucil daily, probiotic   Water intake: 32 oz  Bowel accidents: yes  Frequency of accidents: 1/week  Type of accidents:  gas, liquid or loose stool  Type of protection from fecal accidents and amount used: depends  Pt reports active internal hemorrhoid     Exercise Habits:   walking the dogs      Precautions/Contraindications: frequent UTIs    Falls Risk:   no      Previous treatment for same condition:  no prior PT for current complaints     PFM exercises: Familiar with PFM exercises     Social:   Occupation:  retired     Functional limitation related to chief complaint:  participating in social and exercise activities in community               Childbearing hx: 2 children, vaginal     Pertinent medical testing/Imaging:    Renal US: 08/23 1. 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 There is no hydronephrosis, discrete  solid mass or discernible calculus within the kidney.  2. Limited evaluation of the urinary bladder with no gross abnormality.  There is a post void residual of 81 cc after the second  attempt.  3. Incidentally noted is hepatic steatosis and cholelithiasis.    Pertinent meds related to pt's referring dx:             pt reports no longer taking estrace    Pertinent medical hx related to pt's referring dx:   hysterectomy, multiple abdominal surgeries     Medications:  Current Outpatient Medications on File Prior to Encounter   Medication Sig Dispense Refill    acetaminophen (Tylenol 8 Hour Arthritis Pain) 650 MG CR tablet Take 1 tablet (650 mg) by mouth daily      Alpha-D-Galactosidase (Beano) Tab Take by mouth as needed  0    Apoaequorin (Prevagen) 10 MG Cap Take 1 capsule (10 mg) by mouth daily      atorvastatin (LIPITOR) 10 MG tablet Take 1 tablet (10 mg) by mouth daily 90 tablet 3    Clobetasol Propionate 0.025 % Cream Apply topically as needed      D-Mannose 500 MG Cap Take 2 capsules (1,000 mg) by mouth daily      estradiol (ESTRACE VAGINAL) 0.1 MG/GM vaginal cream Finger tip amount twice weekly  (Patient taking differently: Place vaginally twice a week Finger tip amount twice weekly; on Wednesdays and Saturdays) 42.5 g 2    famotidine (PEPCID) 20 MG tablet Take 1 tablet (20 mg) by mouth daily      loratadine (CLARITIN) 10 MG tablet Take 1 tablet (10 mg) by mouth as needed 30 tablet 11    losartan (COZAAR) 50 MG tablet Take 1.5 tablets (75 mg) by mouth daily 135 tablet 3    Melatonin 3 MG Cap Take 1 capsule (3 mg) by mouth nightly as needed (sleep aide)  0    Multiple Vitamin (multivitamin) capsule Take 1 capsule by mouth daily      NON FORMULARY Take 1 capsule by mouth daily CRANBERRY PACs      Phenazopyridine HCl (AZO TABS PO) Take by mouth as needed (for bladder infection)      Probiotic Product (PROBIOTIC PO) Take 1 capsule by mouth daily      risedronate (ACTONEL) 150 MG tablet Take 1 tablet (150 mg) by mouth every 30 (thirty) days 3 tablet 3    triamcinolone (KENALOG) 0.1 % cream 1 Application      vitamin D (cholecalciferol) 25 MCG (1000 UT) tablet Take 1 tablet (25 mcg) by mouth daily       No current facility-administered medications on file prior to encounter.         PMHx/PSHx:  Obtained from chart and reviewed with pt  Past Medical History:   Diagnosis Date    Acute left ankle pain 08/29/2021    AK (actinic keratosis) 03/26/2022    Colon polyp 1989?    At age 65 had my first  colonoscopy where the doctor found polyps which he removed. Have had colonoscopies  every 5 years.    Desmoid tumor of abdomen 1975    s/p left nephrectomy, radiation at [facility] fracture, left     Gastroesophageal reflux disease     Hepatic steatosis     History of melanoma     right thigh    History of right breast cancer     History of sebaceous gland carcinoma     left thigh    Hyperlipidemia     controlled with meds    Hypertension 04/2021    I noticed my blood pressure had gone up after a doctor appointment.  Inflammatory bowel disease     Lymphocytic colitis    Lymphedema     Left leg ; wears thigh high compression  stockings    Lymphocytic colitis     Lymphocytic colitis 03/28/2021    Osteoporosis     Peripheral vascular disease 1975    Surgery in left groin.    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 However when i moved up here and changed doctors the antibiotic  prescription was allowed to run out and now my uti's are back.    White coat syndrome with hypertension        Past Surgical History:   Procedure Laterality Date    BREAST IMPLANT Right     BUNIONECTOMY      COLON SURGERY  Began in 1974 ended 1980    Desmoid tumor    COLONOSCOPY  2018    COLONOSCOPY N/A 04/08/2022    Procedure: COLONOSCOPY;  Surgeon: Fonnie Mu, MD;  [facility];  Service: Gastroenterology;  Laterality: N/A;    HYSTERECTOMY  1979    MASTECTOMY Right     1996    NEPHRECTOMY Left 1979    desmoid tumor    OOPHORECTOMY  1979    Possibly just the left. Radiation followed the next year on the right side.    SMALL INTESTINE SURGERY  Begin in 1974    During my surgery (7) for my desmoid tumor.    TONSILLECTOMY  1957             Objective:   General: well developed lady, no outwardly visible impairments; ROM, gross extremity strength and mobility WNL   Cognitive: Alert, oriented and appropriate     Gait: WNL  Posture: Grossly good alignment in frontal and sagittal planes  Scars: Location:   LLQ    Appearance: raised in areas with areas of significant tissue restriction      Outcome measure: Pelvic Floor Disability Index (PFDI-20)  Pelvic Organ Prolapse Distress Inventory (POPDI-6):   Score:   4   (0=Best, 100=Worst)   Colorectal-Anal Distress Inventory 8 (CRAD-8):  Score:  50     (0=Best, 100=Worst)    Urinary Distress Inventory 6 (UDI-6):  Score:       45  (0=Best, 100=Worst)          Risks and benefits reviewed  with pt.   Pt not comfortable with internal exam this visit but agreeable to external exam.  Additional PPE: exam gloves      Palpation of superficial pelvic floor muscles: pt guarded but notes no pain or tenderness  PFM contraction: inferred from perineal lift however needs cuing for overflow to glutes and abdominals  Simulating cough (observed in supine hook lying position): no perineal lift noted  Valsalva: no descent noted           Treatment today:    PT evaluation  PFM isolation: training to decreased overflow to glutes and abdominals   Instructed pt in completing 7 day bladder/bowel diary      Instructed in functional constipation tips including morning routine, toileting setup and posture with use of squatty potty, role of water and fiber   Educated the patient to role of  physical therapy, plan of care and goals of therapy.        TIMED TREATMENT CODES min UNTIMED TREATMENT CODES Units    Manual therapy   PT Evaluation 55   Therapeutic Exercise 10  Biofeedback    Neuromuscular Re-Ed  Hot/Cold Packs    Therapeutic Activity  E-Stim (unattended)          Assessment:     BRYLIN BALOUN is a 77 y.o. female presenting to out patient pelvic floor PT with pain, urinary and bowel incontinence. On PT exam today, pt presents with significant abdominal soft tissue mobility restrictions and potential PFM tension and weakness (pt not ready for internal assessment this visit 2/2 fear of pain). Pt would benefit from continued skilled PT as indicated below:             PT Problems related to pt's current complaints:  PT interventions:   pelvic floor muscle weakness  SUI                             Pelvic floor muscle training and strengthening, enhanced with cuing, RUSI, biofeedback and electrical stimulation as indicated     Develop personalized HEP for pelvic floor muscle strengthening    HEP of stretching exercises  Manual therapy including intravaginal and external associated muscle groups as indicated  Instruction in  behavior modification for urinary urgency    Instruction in pelvic prolapse/pelvic floor protection  Functional constipation tips and  techniques       Patient Participation: good       Patient Goal(s): Improve bladder control    Short Term Therapy Goals: x 7 visits    Demonstrate correct technique of PFM contraction exercise for proper strengthening of pelvic floor muscles in order to reduce bowel and bladder incontinence.  2. Decrease urinary incontinence from currently daily to 3x/week  3. Decrease frequency of bowel incontinence from once weekly to once every other week  4. Pt to report greater ease with passing stool with only min to no straining required majority of the time, per pt self-reporting   5. Pt to verbalize understanding of correct body mechanics for ADLs to allow pt to be as active as possible while decreasing urine leakage with ADLs  6. Min to no pelvic floor muscle tightness and/or tenderness on palpation to indicate decreasing muscle spasm responsible for causing pt's c/o pelvic pain    Long Term Therapy Goal(s)  x 8 visits    1. Independent with HEP and symptom management    Potential for Improvement:  Good for above stated goals      Plan:   PT once/week as schedules allow for 8 visits:   Obtain initial and f/u bladder & bowel diaries to establish baseline and help assess progress  Manual therapy techniques including external and intravaginal techniques for pelvic floor muscles and associated muscle groups as indicated  Moist heat/ice as indicated for pain management, self-management and pre/post manual therapy  HEP of gentle stretches to promote relaxation of pelvic floor muscle spasm  Therapeutic exercise, develop and progress HEP of pelvic floor muscle strengthening  RUSI enhanced therapeutic exercise for pelvic floor muscles  Dual channel biofeedback with vaginal sensor for pelvic floor muscle re-education    Education in bladder control strategies for urinary urgency  Education in bladder  irritants and trial elimination of bladder irritants,   Education in activity and/or exercise modification for protecting pelvic floor from  strain/prolapse  Instruction in functional constipation techniques      Clayton Lefort, DPT  Department of [facility]

## 2022-08-25 ENCOUNTER — Ambulatory Visit (INDEPENDENT_AMBULATORY_CARE_PROVIDER_SITE_OTHER): Payer: Medicare Other

## 2022-08-25 DIAGNOSIS — I10 Essential (primary) hypertension: Secondary | ICD-10-CM

## 2022-08-25 DIAGNOSIS — E785 Hyperlipidemia, unspecified: Secondary | ICD-10-CM

## 2022-08-28 ENCOUNTER — Other Ambulatory Visit (INDEPENDENT_AMBULATORY_CARE_PROVIDER_SITE_OTHER): Payer: Self-pay | Admitting: Internal Medicine

## 2022-08-28 NOTE — Telephone Encounter (Signed)
Requesting refill for Risedronate     Last filled: 08/15/2021    Last appointment: 03/31/2022    Next appointment scheduled: none, advised to follow up around 09/30/2022 for HTN    Sent via on call provider given Dr. Roosevelt Locks is out of the office.

## 2022-08-31 ENCOUNTER — Ambulatory Visit
Admission: RE | Admit: 2022-08-31 | Discharge: 2022-08-31 | Disposition: A | Discharge status: Home / Self Care | Payer: Medicare Other | Source: Ambulatory Visit | Attending: Nurse Practitioner | Admitting: Nurse Practitioner

## 2022-08-31 DIAGNOSIS — R102 Pelvic and perineal pain: Secondary | ICD-10-CM | POA: Insufficient documentation

## 2022-08-31 NOTE — PT Progress Note (Signed)
[facility]  [address]  [address], [place], [place] [address]  [phone]  [phone]    DAILY TREATMENT NOTE    Patient:  Aimee Wade       MRN#:  [id number]  Referring MD:  Verta Ellen, NP            Certification Period: 08/24/2022 - 11/22/2022         Insurance Authorization: required for follow ups    Medical Dx: N 39.3 Stress Incontinence    Therapy Dx: decreased bladder control, decreased bowel control, pelvic pain      Date of Service: 08/31/2022  Time of Service: 16:00-16:54  Treatment number: 20-Aug-2022      TIMED TREATMENT CODES min UNTIMED TREATMENT CODES units   Manual therapy   PT Evaluation    Therapeutic Exercise 54  Biofeedback    Neuromuscular Re-Ed  Hot/Cold Packs    Therapeutic Activity  E-Stim (unattended)      Assessment: Pt instructed in PFM stretching and down-training techniques. Some difficulty with stretches 2/2 hip and back discomfort, modified as needed.     Plan:  Frequency of treatment: Once a week for 8 weeks  Overall plan for PT:    Pelvic floor muscle training and strengthening, enhanced with cuing, RUSI, biofeedback and electrical stimulation as indicated     Develop personalized HEP for pelvic floor muscle strengthening    HEP of stretching exercises  Manual therapy including intravaginal and external associated muscle groups as indicated  Instruction in behavior modification for urinary urgency    Instruction in pelvic prolapse/pelvic floor protection  Functional constipation tips and  techniques  Next visit:    PFM down-training  Review HEP  Goals:   Short Term Therapy Goals: x 7 visits    Demonstrate correct technique of PFM contraction exercise for proper strengthening of pelvic floor muscles in order to reduce bowel and bladder incontinence.  2. Decrease urinary incontinence from currently daily to 3x/week  3. Decrease frequency of bowel incontinence from once weekly to once every other week  4. Pt to report  greater ease with passing stool with only min to no straining required majority of the time, per pt self-reporting   5. Pt to verbalize understanding of correct body mechanics for ADLs to allow pt to be as active as possible while decreasing urine leakage with ADLs  6. Min to no pelvic floor muscle tightness and/or tenderness on palpation to indicate decreasing muscle spasm responsible for causing pt's c/o pelvic pain     Long Term Therapy Goal(s)  x 8 visits    1. Independent with HEP and symptom management    Precautions:   standard  Falls Risk:  no  Any pertinent medications?:  no  Change in medications?:   no   Change in condition/hospitalization?:  no      Subjective: Pt reports she has some questions re: constipation tips- reviewed handout    Pain scale and location (0-10/10):       N/A         Intervention specifically for pain:    N/A       Patient is appropriate for Physical Therapy intervention at this time.  Pt is agreeable to participation in the therapy session.     Objective/Treatment Activities:   Reviewed 3 day bladder diary:  Baseline  Total voids in 24 hr period: 5-6  Nocturia episodes: 1-3  Bladder leaks total:  once in 3 day period     Reviewed 3 day bowel diary: Baseline  Frequency of BMs: 1-3 per day  Amount of straining for stools:   min to mod a times  Time to complete BM: 5-15 minues  Type stools on Bristol Stool Scale: mostly formed, one episode of liquid stool  Typical amount of straining: min to mod  Bowel accidents: twice in 3 day period  Range of fiber intake: metamucil daily    Instruction in functional constipation techniques: reviewed items in handout as pertinent to pt.    Pt declined internal assessment this visit  Instructed on PFM stretches. Will initiate PFM down-training at this point as pt reports history of pain and is guarded through PFM:  Access Code: [email or web address]  URL: [email or web address]  Date: 08/31/2022  Prepared by: Mitch Arquette    Exercises  -  Supine Diaphragmatic Breathing  - 2 x daily - 7 x weekly - 1 sets - 10 reps  - Pelvic Tilt  - 2 x daily - 7 x weekly - 1-3 sets - 10 reps  - Supine Pelvic Floor Stretch  - 2 x daily - 7 x weekly - 1 sets - 3 reps - 20-30 sec hold  - Supine Butterfly Groin Stretch  - 2 x daily - 7 x weekly - 1 sets - 3 reps - 20-30 sec hold  - Cat Cow  - 1 x daily - 7 x weekly - 1 sets - 10 reps    Pt Education:     HEP of stretches as above  Education in functional constipation techniques  Education in bladder irritants and elimination diet of bladder irritants  Educated the patient on plan of care, goals of therapy and progression of exercises and/or activities as appropriate.    Clayton Lefort, DPT  Department of [facility]

## 2022-09-09 ENCOUNTER — Ambulatory Visit
Admission: RE | Admit: 2022-09-09 | Discharge: 2022-09-09 | Disposition: A | Discharge status: Home / Self Care | Payer: Medicare Other | Source: Ambulatory Visit

## 2022-09-09 NOTE — PT Progress Note (Addendum)
[facility]  [address]  [address], [place], [place] [address]  [phone]  [phone]    DAILY TREATMENT NOTE    Patient:  Aimee Wade       MRN#:  [id number]  Referring MD:  Verta Ellen, NP             Certification Period: 08/24/2022 - 11/22/2022                             Insurance Authorization: required for follow ups     Medical Dx: N 39.3 Stress Incontinence   Therapy Dx: decreased bladder control, decreased bowel control, pelvic pain       Date of Service: 09/09/2022  Time of Service: 16:02-16:50  Treatment number: 2022-09-15      TIMED TREATMENT CODES min UNTIMED TREATMENT CODES units   Manual therapy   PT Evaluation    Therapeutic Exercise  48 Biofeedback    Neuromuscular Re-Ed  Hot/Cold Packs    Therapeutic Activity  E-Stim (unattended)      Assessment:    Pt able to isolate PFM contract/relax phases through external palpation without c/o discomfort but needs some cuing for breath holding and overflow. Pt reports she is able to discern the contract more than the release.     Plan:  Frequency of treatment: Once a week for 8 weeks  Overall plan for PT:    Pelvic floor muscle training and strengthening, enhanced with cuing, RUSI, biofeedback and electrical stimulation as indicated     Develop personalized HEP for pelvic floor muscle strengthening    HEP of stretching exercises  Manual therapy including intravaginal and external associated muscle groups as indicated  Instruction in behavior modification for urinary urgency    Instruction in pelvic prolapse/pelvic floor protection  Functional constipation tips and  techniques  Next visit:    Cont PFM training  HEP  Goals:   Short Term Therapy Goals: x 7 visits    Demonstrate correct technique of PFM contraction exercise for proper strengthening of pelvic floor muscles in order to reduce bowel and bladder incontinence.  2. Decrease urinary incontinence from currently daily to 3x/week  3. Decrease  frequency of bowel incontinence from once weekly to once every other week  4. Pt to report greater ease with passing stool with only min to no straining required majority of the time, per pt self-reporting   5. Pt to verbalize understanding of correct body mechanics for ADLs to allow pt to be as active as possible while decreasing urine leakage with ADLs  6. Min to no pelvic floor muscle tightness and/or tenderness on palpation to indicate decreasing muscle spasm responsible for causing pt's c/o pelvic pain  Long Term Therapy Goal(s)  x 8 visits    1. Independent with HEP and symptom management    Precautions:   standard  Falls Risk:  no  Any pertinent medications?:  no  Change in medications?:   no   Change in condition/hospitalization?:  no    Risks and benefits of Rx reviewed with patient, pt agreeable to Rx.     Subjective: Pt reports she has been trying bladder training during the day, remains with incontinence     Pain scale and location (0-10/10):       N/A         Intervention specifically for pain:    N/A  Patient is appropriate for Physical Therapy intervention at this time.  Pt is agreeable to participation in the therapy session.     Objective/Treatment Activities:   Discussed water intake during the day and when to stop liquids before bed. Pt reports she is drinking about 32 oz water and 3-4 additional cups of tea. States she feels like she is drinking enough as she is monitoring the color of her urine     External assessment of PFM demonstrates discernible contract and relax phases, overflow and breath holding noted, improved with cuing  Established HEP of PFM exercises:  2 sec work/4 sec rest, 2 sets of 10 reps  5 sec work/10 sec rest, 5 reps    Education in behavior modification for urinary urgency    Pt Education:     HEP of PFM exercises as above  Education in behavior modification for urinary urgency  Educated the patient on plan of care, goals of therapy and progression of exercises and/or  activities as appropriate.    Clayton Lefort, DPT  Department of [facility]

## 2022-09-25 ENCOUNTER — Ambulatory Visit (INDEPENDENT_AMBULATORY_CARE_PROVIDER_SITE_OTHER): Payer: Medicare Other

## 2022-09-25 ENCOUNTER — Ambulatory Visit (INDEPENDENT_AMBULATORY_CARE_PROVIDER_SITE_OTHER): Payer: Self-pay

## 2022-09-25 DIAGNOSIS — I10 Essential (primary) hypertension: Secondary | ICD-10-CM

## 2022-09-25 DIAGNOSIS — E785 Hyperlipidemia, unspecified: Secondary | ICD-10-CM

## 2022-09-30 ENCOUNTER — Ambulatory Visit (INDEPENDENT_AMBULATORY_CARE_PROVIDER_SITE_OTHER): Payer: Medicare Other | Admitting: Internal Medicine

## 2022-10-07 ENCOUNTER — Ambulatory Visit (INDEPENDENT_AMBULATORY_CARE_PROVIDER_SITE_OTHER): Payer: Medicare Other | Admitting: Internal Medicine

## 2022-10-07 ENCOUNTER — Encounter (INDEPENDENT_AMBULATORY_CARE_PROVIDER_SITE_OTHER): Payer: Self-pay | Admitting: Internal Medicine

## 2022-10-07 VITALS — BP 130/82 | HR 79 | Temp 97.4°F | Ht 65.35 in | Wt 162.0 lb

## 2022-10-07 DIAGNOSIS — I10 Essential (primary) hypertension: Secondary | ICD-10-CM

## 2022-10-07 NOTE — Progress Notes (Signed)
Subjective:      Patient ID: Aimee Wade is a 77 y.o. female     Chief Complaint:  Chief Complaint   Patient presents with    Hypertension     Pt monitors BP at home and notes no concern     HPI:  10/07/22:  Overall feeling well.  Here for follow-up of hypertension.  Checks home blood pressure every 1 to 2 weeks or if she has a headache.  Reports home blood pressure systolic 120s diastolic 70s to 80s.  Reports diet and exercise could be better.  Walks her 2 dogs twice a day.  Wears compression stockings.  Follows urology for recurrent UTIs and doing pelvic floor physical therapy.  Was recently in [place] for a week for her niece's wedding.    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    Erosive lichen planus of vagina    Nocturia    Essential hypertension    Pelvic pain in female    History of colon polyps    Melanocytic nevi of trunk    Postmenopausal    Hyperlipidemia    Gastroesophageal reflux disease    Tubular adenoma of colon    Hypertension, unspecified type     Current Medications:  Current Outpatient Medications on File Prior to Visit   Medication Sig Dispense Refill    acetaminophen (Tylenol 8 Hour Arthritis Pain) 650 MG CR tablet Take 1 tablet (650 mg) by mouth daily      Alpha-D-Galactosidase (Beano) Tab Take by mouth as needed  0    Apoaequorin (Prevagen) 10 MG Cap Take 1 capsule (10 mg) by mouth daily      atorvastatin (LIPITOR) 10 MG tablet Take 1 tablet (10 mg) by mouth daily 90 tablet 3    D-Mannose 500 MG Cap Take 2 capsules (1,000 mg) by mouth daily       estradiol (ESTRACE VAGINAL) 0.1 MG/GM vaginal cream Finger tip amount twice weekly (Patient taking differently: Place vaginally twice a week Finger tip amount twice weekly; on Wednesdays and Saturdays) 42.5 g 2    famotidine (PEPCID) 20 MG tablet Take 1 tablet (20 mg) by mouth daily      loratadine (CLARITIN) 10 MG tablet Take 1 tablet (10 mg) by mouth as needed 30 tablet 11    losartan (COZAAR) 50 MG tablet Take 1.5 tablets (75 mg) by mouth daily 135 tablet 3    Melatonin 3 MG Cap Take 1 capsule (3 mg) by mouth nightly as needed (sleep aide)  0    Multiple Vitamin (multivitamin) capsule Take 1 capsule by mouth daily      NON FORMULARY Take 1 capsule by mouth daily CRANBERRY PACs      Phenazopyridine HCl (AZO TABS PO) Take by mouth as needed (for bladder infection)      Probiotic Product (PROBIOTIC PO) Take 1 capsule by mouth daily      risedronate (ACTONEL) 150 MG  tablet TAKE ONE TABLET BY MOUTH EVERY 30 DAYS 12 tablet 0    vitamin D (cholecalciferol) 25 MCG (1000 UT) tablet Take 1 tablet (25 mcg) by mouth daily      [DISCONTINUED] Clobetasol Propionate 0.025 % Cream Apply topically as needed (Patient not taking: Reported on 10/07/2022)      [DISCONTINUED] triamcinolone (KENALOG) 0.1 % cream 1 Application (Patient not taking: Reported on 10/07/2022)       No current facility-administered medications on file prior to visit.     Allergies:  Allergies   Allergen Reactions    Codeine Nausea Only     Other reaction(s): Unknown    Morphine Nausea Only     Other reaction(s): Unknown    Fructose Diarrhea    Sesame Oil     Soybean-Containing Drug Products      Vitals:  Vitals:    10/07/22 0830 10/07/22 0834 10/07/22 0836   BP: 142/88 140/86 130/82   BP Site: Left arm     Patient Position: Sitting     Cuff Size: Medium     Pulse: 79     Temp: 97.4 F (36.3 C)     TempSrc: Temporal     SpO2: 96%     Weight: 73.5 kg (162 lb)     Height: 1.66 m (5' 5.35")       BP Readings from Last 3 Encounters:   10/07/22 130/82   07/28/22  127/81   04/13/22 129/82     Wt Readings from Last 3 Encounters:   10/07/22 73.5 kg (162 lb)   07/28/22 73.5 kg (162 lb)   04/13/22 72.6 kg (160 lb)     Body mass index is 26.67 kg/m.     Objective:     Physical Exam:  Physical Exam  Constitutional:       General: She is not in acute distress.     Appearance: She is not ill-appearing.   HENT:      Head: Normocephalic.   Cardiovascular:      Rate and Rhythm: Normal rate and regular rhythm.      Heart sounds: Normal heart sounds.   Pulmonary:      Effort: Pulmonary effort is normal.      Breath sounds: Normal breath sounds.   Neurological:      Mental Status: She is alert.   Psychiatric:         Behavior: Behavior normal.       Assessment:     Aimee Wade was seen today for hypertension.    Diagnoses and all orders for this visit:    Essential hypertension      Plan:     Hypertension: Recommend more plant based, low salt diet.  Recommend checking home blood pressures.  On losartan 75 mg daily, may uptitrate in the future if blood pressure remains elevated.  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  On atorvastatin 10 mg daily  Osteoporosis: Recommend dietary calcium intake, vitamin D, weight bearing exercises, minimize alcohol.  On risedronate 150 mg monthly.  Consider DEXA every 2 years.  May follow up with endocrinology in the future    Return in about 6 months (around 04/08/2023) for physical, hypertension.    Concha Norway, MD

## 2022-10-12 ENCOUNTER — Ambulatory Visit
Admission: RE | Admit: 2022-10-12 | Discharge: 2022-10-12 | Disposition: A | Discharge status: Home / Self Care | Payer: Medicare Other | Source: Ambulatory Visit | Attending: Nurse Practitioner | Admitting: Nurse Practitioner

## 2022-10-12 DIAGNOSIS — R102 Pelvic and perineal pain: Secondary | ICD-10-CM | POA: Insufficient documentation

## 2022-10-12 NOTE — PT Progress Note (Signed)
[facility] Out[facility]  [address]  [address]  [place], [place] [address]  [phone]  [phone]    DAILY TREATMENT NOTE    Patient:  Aimee Wade       MRN#:  [id number]  Referring MD:  [name], NP             Certification Period: 08/24/2022 - 11/22/2022                           Insurance Authorization: required for follow ups     Medical Dx: N 39.3 Stress Incontinence   Therapy Dx:  decreased bladder control, decreased bowel control, pelvic pain       Date of Service: 10/12/2022  Time of Service: 4696-2952  Treatment number: Nov 02, 2022      TIMED TREATMENT CODES min UNTIMED TREATMENT CODES units   Manual therapy 15  PT Evaluation    Therapeutic Exercise 40  Biofeedback    Neuromuscular Re-Ed  Hot/Cold Packs    Therapeutic Activity  E-Stim (unattended)      Assessment: Pt is able to active PFM with some overflow to abdominal muscles. Diaphragmatic breath training performed with cuing for rib expansion. Pt with restrictions in left abdomen 2/2 scar tissue, mobilization performed and encouraged to continue at home.       Plan:  Frequency of treatment: Once a week for 8 weeks  Overall plan for PT:    Pelvic floor muscle training and strengthening, enhanced with cuing, RUSI, biofeedback and electrical stimulation as indicated     Develop personalized HEP for pelvic floor muscle strengthening    HEP of stretching exercises  Manual therapy including intravaginal and external associated muscle groups as indicated  Instruction in behavior modification for urinary urgency    Instruction in pelvic prolapse/pelvic floor protection  Functional constipation tips and  techniques  Next visit:    Cont PFM training  Manual therapy as needed  Review HEP   Goals:   Short Term Therapy Goals: x 7 visits    Demonstrate correct technique of PFM contraction exercise for proper strengthening of pelvic floor muscles in order to reduce bowel and bladder incontinence.  2. Decrease  urinary incontinence from currently daily to 3x/week  3. Decrease frequency of bowel incontinence from once weekly to once every other week  4. Pt to report greater ease with passing stool with only min to no straining required majority of the time, per pt self-reporting   5. Pt to verbalize understanding of correct body mechanics for ADLs to allow pt to be as active as possible while decreasing urine leakage with ADLs  6. Min to no pelvic floor muscle tightness and/or tenderness on palpation to indicate decreasing muscle spasm responsible for causing pt's c/o pelvic pain  Long Term Therapy Goal(s)  x 8 visits    1. Independent with HEP and symptom management    Precautions:   standard  Falls Risk:  no  Any pertinent medications?:  no  Change in medications?:   no   Change in condition/hospitalization?:  no      Subjective: Pt reports she isn't quite sure she is doing the exercises correctly    Pain scale and location (0-10/10):       N/A         Intervention specifically for pain:    N/A       Patient is appropriate for Physical Therapy intervention  at this time.  Pt is agreeable to participation in the therapy session.   Risks and benefits reviewed with pt. Verbal consent obtained.     Objective/Treatment Activities:     RUSI enhanced pelvic floor muscle training utilizing curvilinear sound head with transabdominal imaging via transverse views: Via observation of bladder base inferred quality of PFM contraction technique.  Provided verbal instruction and verbal cuing for correct technique.  2 sec work/4 sec rest, 3 sets of 10 reps    Diaphragmatic breath training with tactile cuing at stomach and ribs.   Access Code: [email or web address]  URL: [email or web address]  Date: 10/12/2022  Prepared by: Trisa Cranor    Exercises  - Supine Diaphragmatic Breathing  - 2 x daily - 7 x weekly - 1 sets - 10 reps  - Pelvic Tilt  - 2 x daily - 7 x weekly - 1-3 sets - 10 reps  - Supine Pelvic Floor Stretch  - 2 x daily -  7 x weekly - 1 sets - 3 reps - 20-30 sec hold  - Supine Butterfly Groin Stretch  - 2 x daily - 7 x weekly - 1 sets - 3 reps - 20-30 sec hold  - Cat Cow  - 1 x daily - 7 x weekly - 1 sets - 10 reps    Scar tissue mobilization to L abdominal surgical scar. Restrictions noted in all directions. Tenderness into pelvic region.    Pt Education:     HEP of PFM exercises as above  Diaphragmatic breath training  Scar tissue mobilization   Educated the patient on plan of care, goals of therapy and progression of exercises and/or activities as appropriate.    Freada Bergeron, DPT  Department of Rehabilitation  [facility]

## 2022-10-14 ENCOUNTER — Ambulatory Visit (INDEPENDENT_AMBULATORY_CARE_PROVIDER_SITE_OTHER): Payer: Medicare Other

## 2022-10-14 DIAGNOSIS — E785 Hyperlipidemia, unspecified: Secondary | ICD-10-CM

## 2022-10-14 DIAGNOSIS — I10 Essential (primary) hypertension: Secondary | ICD-10-CM

## 2022-10-21 ENCOUNTER — Ambulatory Visit
Admission: RE | Admit: 2022-10-21 | Discharge: 2022-10-21 | Disposition: A | Discharge status: Home / Self Care | Payer: Medicare Other | Source: Ambulatory Visit

## 2022-10-21 NOTE — PT Progress Note (Signed)
[facility] Outpatient Physical Therapy  [address]  [address]  [place], [place] [address]  [phone]  [phone]    DAILY TREATMENT NOTE    Patient:  Aimee Wade       MRN#:  [id number]  Referring MD:  [name], NP            Certification Period: 08/24/2022 - 11/22/2022                      Insurance Authorization: required for follow ups     Medical Dx: N 39.3 Stress Incontinence   Therapy Dx: decreased bladder control, decreased bowel control, pelvic pain       Date of Service: 10/21/2022  Time of Service: 12:02-12:44  Treatment number: 11/29/2022      TIMED TREATMENT CODES min UNTIMED TREATMENT CODES units   Manual therapy   PT Evaluation    Therapeutic Exercise 42  Biofeedback 14   Neuromuscular Re-Ed  Hot/Cold Packs    Therapeutic Activity  E-Stim (unattended)      Assessment: Pt inconsistent with HEP, has not made progress in symptom management as she feels she currently has a UTI and is experiencing increased frequency and burning (has MD appt next week). Reviewed PFM training in supine and sitting and instructed in additional strengthening exercises.        Plan:  Frequency of treatment: Once a week for 8 weeks  Overall plan for PT:    Pelvic floor muscle training and strengthening, enhanced with cuing, RUSI, biofeedback and electrical stimulation as indicated     Develop personalized HEP for pelvic floor muscle strengthening    HEP of stretching exercises  Manual therapy including intravaginal and external associated muscle groups as indicated  Instruction in behavior modification for urinary urgency    Instruction in pelvic prolapse/pelvic floor protection  Functional constipation tips and  techniques  Next visit:    Cont PFM training  Manual therapy as needed  Review and progress HEP   Goals:   Short Term Therapy Goals: x 7 visits    Demonstrate correct technique of PFM contraction exercise for proper strengthening of pelvic floor muscles in order to reduce bowel  and bladder incontinence.  2. Decrease urinary incontinence from currently daily to 3x/week  3. Decrease frequency of bowel incontinence from once weekly to once every other week  4. Pt to report greater ease with passing stool with only min to no straining required majority of the time, per pt self-reporting   5. Pt to verbalize understanding of correct body mechanics for ADLs to allow pt to be as active as possible while decreasing urine leakage with ADLs  6. Min to no pelvic floor muscle tightness and/or tenderness on palpation to indicate decreasing muscle spasm responsible for causing pt's c/o pelvic pain  Long Term Therapy Goal(s)  x 8 visits    1. Independent with HEP and symptom management    Precautions:   standard  Falls Risk:  no  Any pertinent medications?:  no  Change in medications?:   no   Change in condition/hospitalization?:  no    Subjective: Pt reports she is doing the exercises when she remembers. States since she returned from her trip a couple of weeks ago she has been having burning with urination and increased frequency, has appointment with MD next week.    Pain scale and location (0-10/10):       N/A  Intervention specifically for pain:    N/A       Patient is appropriate for Physical Therapy intervention at this time.  Pt is agreeable to participation in the therapy session.   Risks and benefits reviewed with pt. Verbal consent obtained.     Objective/Treatment Activities:   LE  Hip Flex  Hip Abd Knee flex Knee ext DF IR/ER    R  4 4  4 4 4  4/4   L  4- 3+  4- 4- 4- 3+/3+     PFM training with Biofeedback: two sensors peri-anal, ground on L buttock  Training performed in hooklying and in sitting. Focus on decreased resting tone and improved hold time during contract.   2 sec work/4 sec rest, 3 sets of 10 reps    Instructed in the following exercises:   Access Code: [email or web address]  URL: [email or web address]  Date: 10/21/2022  Prepared by: Shiloh Southern    Exercises  -  Supine Diaphragmatic Breathing  - 2 x daily - 7 x weekly - 1 sets - 10 reps  - Pelvic Tilt  - 2 x daily - 7 x weekly - 1-3 sets - 10 reps  - Supine Pelvic Floor Stretch  - 2 x daily - 7 x weekly - 1 sets - 3 reps - 20-30 sec hold  - Supine Butterfly Groin Stretch  - 2 x daily - 7 x weekly - 1 sets - 3 reps - 20-30 sec hold  - Supine Bridge  - 1 x daily - 7 x weekly - 1 sets - 10 reps  - Clamshell  - 1 x daily - 7 x weekly - 1 sets - 10 reps  - Clamshell (Mirrored)  - 1 x daily - 7 x weekly - 1 sets - 10 reps  - Quick Flick Pelvic Floor Contractions in Hooklying  - 3-5 x daily - 7 x weekly - 1-2 sets - 10 reps - 2 sec hold - 4 rest   - Seated Pelvic Floor Contraction  - 3-5 x daily - 7 x weekly - 1 sets - 10 reps - 2 sec hold    Pt Education:     HEP of PFM exercises as above  Educated the patient on plan of care, goals of therapy and progression of exercises and/or activities as appropriate.    Freada Bergeron, DPT  Department of Rehabilitation  [facility]

## 2022-10-26 ENCOUNTER — Ambulatory Visit
Admission: RE | Admit: 2022-10-26 | Discharge: 2022-10-26 | Disposition: A | Discharge status: Home / Self Care | Payer: Medicare Other | Source: Ambulatory Visit

## 2022-10-26 NOTE — PT Progress Note (Signed)
[facility] Outpatient Physical Therapy  [address]  [address]  [place], [place] [address]  [phone]  [phone]    DAILY TREATMENT NOTE    Patient:  Aimee Wade       MRN#:  [id number]  Referring MD:  [name], NP            Certification Period: 08/24/2022 - 11/22/2022                              Insurance Authorization: required for follow ups     Medical Dx: N 39.3 Stress Incontinence   Therapy Dx: decreased bladder control, decreased bowel control, pelvic pain       Date of Service: 10/26/2022  Time of Service: 11:01-11:43  Treatment number: 01-02-23      TIMED TREATMENT CODES min UNTIMED TREATMENT CODES units   Manual therapy   PT Evaluation    Therapeutic Exercise  42 Biofeedback    Neuromuscular Re-Ed  Hot/Cold Packs    Therapeutic Activity  E-Stim (unattended)      Assessment: Encouraged increased consistency of HEP for improvement of symptoms. Needs occasional cues for breath management.     Plan:  Frequency of treatment: Once a week for 8 weeks  Overall plan for PT:    Pelvic floor muscle training and strengthening, enhanced with cuing, RUSI, biofeedback and electrical stimulation as indicated     Develop personalized HEP for pelvic floor muscle strengthening    HEP of stretching exercises  Manual therapy including intravaginal and external associated muscle groups as indicated  Instruction in behavior modification for urinary urgency    Instruction in pelvic prolapse/pelvic floor protection  Functional constipation tips and  techniques  Next visit:    Cont PFM training  Diaphragmatic breath   Review HEP   Goals:   Short Term Therapy Goals: x 7 visits    Demonstrate correct technique of PFM contraction exercise for proper strengthening of pelvic floor muscles in order to reduce bowel and bladder incontinence.  2. Decrease urinary incontinence from currently daily to 3x/week  3. Decrease frequency of bowel incontinence from once weekly to once every other week  4.  Pt to report greater ease with passing stool with only min to no straining required majority of the time, per pt self-reporting   5. Pt to verbalize understanding of correct body mechanics for ADLs to allow pt to be as active as possible while decreasing urine leakage with ADLs  6. Min to no pelvic floor muscle tightness and/or tenderness on palpation to indicate decreasing muscle spasm responsible for causing pt's c/o pelvic pain  Long Term Therapy Goal(s)  x 8 visits    1. Independent with HEP and symptom management    Precautions:   standard  Falls Risk:  no  Any pertinent medications?:  no  Change in medications?:   no   Change in condition/hospitalization?:  no      Subjective: Pt reports she has been able to do the PFM exercise a little more consistently over the last week.  Discussed fluid intake, report she has already had 24 oz of water this am. Encouraged to maintain hydration as weather is warmer.    Pain scale and location (0-10/10):       N/A         Intervention specifically for pain:    N/A       Patient is  appropriate for Physical Therapy intervention at this time.  Pt is agreeable to participation in the therapy session.   Risks and benefits reviewed with pt. Verbal consent obtained.     Objective/Treatment Activities:   RUSI enhanced pelvic floor muscle training utilizing curvilinear sound head with transabdominal imaging via transverse views: Via observation of bladder base inferred quality of PFM contraction technique.  Provided verbal instruction and verbal cuing for correct technique.  2 sec work/4 sec rest, 2 sets of 10 reps  10 sec work/10 sec rest, 5 reps    Diaphragmatic breath training. Encouraged prolonged exhalation.  Instructed in scar tissue mobility and encouraged to continue at home  Instructed in TrA activation, review of HEP:  Exercises  - Supine Diaphragmatic Breathing  - 2 x daily - 7 x weekly - 1 sets - 10 reps  - Pelvic Tilt  - 2 x daily - 7 x weekly - 1-3 sets - 10 reps  -  Supine Pelvic Floor Stretch  - 2 x daily - 7 x weekly - 1 sets - 3 reps - 20-30 sec hold  - Supine Butterfly Groin Stretch  - 2 x daily - 7 x weekly - 1 sets - 3 reps - 20-30 sec hold  - Supine Bridge  - 1 x daily - 7 x weekly - 1 sets - 10 reps  - Clamshell  - 1 x daily - 7 x weekly - 1 sets - 10 reps  - Clamshell (Mirrored)  - 1 x daily - 7 x weekly - 1 sets - 10 reps  - Quick Flick Pelvic Floor Contractions in Hooklying  - 3-5 x daily - 7 x weekly - 1-2 sets - 10 reps - 2 sec hold - 4 rest   - Seated Pelvic Floor Contraction  - 3-5 x daily - 7 x weekly - 1 sets - 10 reps - 2 sec hold  - Hooklying Transversus Abdominis Palpation  - 1 x daily - 7 x weekly - 1 sets - 10 reps - 5 sec hold    Pt Education:     HEP of PFM exercises as above  Educated the patient on plan of care, goals of therapy and progression of exercises and/or activities as appropriate.    Freada Bergeron, DPT  Department of Rehabilitation  [facility]

## 2022-10-27 ENCOUNTER — Ambulatory Visit (INDEPENDENT_AMBULATORY_CARE_PROVIDER_SITE_OTHER): Payer: Medicare Other | Admitting: Nurse Practitioner

## 2022-10-27 ENCOUNTER — Encounter (INDEPENDENT_AMBULATORY_CARE_PROVIDER_SITE_OTHER): Payer: Self-pay | Admitting: Nurse Practitioner

## 2022-10-27 VITALS — BP 139/72 | HR 67 | Temp 97.4°F | Wt 164.0 lb

## 2022-10-27 DIAGNOSIS — R339 Retention of urine, unspecified: Secondary | ICD-10-CM

## 2022-10-27 DIAGNOSIS — Z905 Acquired absence of kidney: Secondary | ICD-10-CM

## 2022-10-27 DIAGNOSIS — N390 Urinary tract infection, site not specified: Secondary | ICD-10-CM

## 2022-10-27 DIAGNOSIS — N3946 Mixed incontinence: Secondary | ICD-10-CM

## 2022-10-27 LAB — URINALYSIS POC
Blood, UA POCT: NEGATIVE
POCT Urine Bilirubin: NEGATIVE
POCT Urine Glucose: NEGATIVE mg/dL
POCT Urine Ketones: NEGATIVE mg/dL
POCT Urine Nitrites: NEGATIVE
POCT Urine Urobilibogen: 0.2 mg/dL (ref 0.0–1.0)
POCT Urine pH: 5.5 (ref 5.0–8.0)
Protein, UR POCT: NEGATIVE mg/dL
Urine Specific Gravity POC: 1.01 (ref 1.001–1.035)

## 2022-10-27 NOTE — Patient Instructions (Addendum)
Timed voiding: urinate every 2-3 hours during the day  Double voiding: urinate then 5 minutes after urinate again during the day  Positional voiding (sit straight up at the end of voiding then lean forward to help empty your bladder)   Please continue D-mannose, Probiotics and Cranberry daily  Follow up in 3 months

## 2022-10-27 NOTE — Progress Notes (Signed)
Subjective:      Patient ID: Aimee Wade is a 77 y.o. female     Chief Complaint:  1. Incomplete bladder emptying    2. H/O left nephrectomy    3. Acquired solitary kidney    4. Mixed urge and stress incontinence    5. Recurrent UTI      1) Nocturia  2) Frequent UTI  3) Urinary incontinence     01/19/2022 Dr. 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  Estrace vaginal cream uses twice a month Wade to discomfort after, she reported that she has bothersome urinary symptoms when she uses the cream  Pelvic PT- she did not started, has problems with scheduling and insurance.She does kegels by herself and positional voiding and it helps with urinary incontinence.  RUS 01/29/2022: solitary right kidney, no hydronephrosis, PVR 81 cc  Today: no dysuria, no bladder pain, no pelvic pain, no urgency and frequency and no fever.  She is asymptomatic. She still has Bactrim Dr. Dimple Casey gave her just in case she develops symptoms.  Nocturia Q2 hours. Bedtime at 9:30 pm, last liquid at 8 pm water  She had colonoscopy last week- constipation on and off, last BM today  She snores     She reported that she uses scented and with lotion toilet paper for years         07/28/2022  She is here for 3 months follow up. No uti since last visit.  Takes daily D-Mannose, Probiotic, and cranberry pills  Nocturia Q2 hours, bedtime at 9 -10 pm, last liquid 7:30 pm( water)  She did not have Sleep study   Today: no dysuria, no hematuria, but she Reported bladder fullness/ pain and fatigue  She still leaks urine with cough, sneeze-never scheduled pelvic PT  She wears diapers 3x daily  Sometimes constipated- BM daily( sometimes she has to strain)  She stop Estrace vaginal cream 2 months ago Wade to vaginal itching and burning       TODAY 10/27/2022  Here for 3 months follow up.  Takes daily D-Mannose, Probiotic, and cranberry pills  She reported 3 UTI's since last visit.  Last UTI was few weeks ago, pt did not see provider and urine sample was not done. She double d-mannose and symptoms resolved.  Last week she had tooth infection and was started on Amoxicillin, she still takes abx. She denies UTI symptoms.    No constipation      The following portions of the patient's history were reviewed and updated as appropriate: allergies, current medications, past family history, past medical history, past social history, past surgical history and  problem list.    Review of Systems  As per HPI      Objective:   BP 139/72 (BP Site: Left arm, Patient Position: Sitting, Cuff Size: Medium)   Pulse 67   Temp 97.4 F (36.3 C) (Oral)   Wt 74.4 kg (164 lb)   BMI 27.00 kg/m   General appearance - alert, well appearing, and in no distress  Mental status - alert, oriented to person, place, and time, appropriate affect       PVR 151 ml    Lab Review             Component  Ref Range & Units 10/27/22  8:49 AM 07/28/22  8:58 AM 04/13/22 10:15 AM 01/19/22  9:41 AM   POCT Urine Color  Yellow Yellow Yellow Yellow Dark yellow   POCT Urine Clarity  Clear - Hazy Clear Cloudy Abnormal  Cloudy Abnormal  Cloudy Abnormal    POCT Urine pH  5.0 - 8.0 5.5 5.5 5.5 6.0   Urine leukocyte Esterase, POCT  Negative Trace Abnormal   Large Abnormal  Large Abnormal  Large Abnormal    POCT Urine Nitrites  Negative Negative Positive Abnormal  Positive Abnormal  Positive Abnormal    Protein, UR POCT  Negative mg/dL Negative Trace Abnormal  30 Abnormal  30 Abnormal    POCT Urine Glucose  Negative mg/dL Negative Negative Negative Negative   POCT Urine Ketones  Negative mg/dL Negative Negative Negative Negative   POCT Urine Urobilibogen  0.0 - 1.0 mg/dL 0.2 0.2 0.2 0.2   POCT Urine Bilirubin  Negative Negative Negative Negative Negative   Blood, UA POCT  Negative Negative Small Abnormal  Moderate Abnormal  Moderate Abnormal    Urine Specific Gravity POC  1.001 - 1.035 1.010          Radiology Review   Results for orders placed during the hospital encounter of 01/29/22    US Renal Kidney Bladder Complete    Narrative  HISTORY: Right-sided flank pain. Solitary right kidney.    COMPARISON: None.    FINDINGS:  Renal Measurements (long axis):  Right kidney: 12.3 cm  Left kidney: Status post nephrectomy.    Right kidney: Parenchymal echogenicity and thickness are normal. No  hydronephrosis. No solid renal mass. No sonographic evidence of renal  calculi.    Incidentally noted is hepatic steatosis and cholelithiasis.    Bladder: The bladder is imaged at a volume of 86 cc. The wall thickness is  normal for the degree of distention. No mass, calculi, or diverticula.  Post-void residual 81 cc after the second attempt. There is a right  ureteral jet.    Impression  1. 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 There is no hydronephrosis, discrete  solid mass or discernible calculus within the kidney.  2. Limited evaluation of the urinary bladder with no gross abnormality.  There is a post void residual of 81 cc after the second attempt.  3. Incidentally noted is hepatic steatosis and cholelithiasis.    Stephannie Peters, MD  01/29/2022 9:46 AM         Assessment:     1. Incomplete bladder emptying    2. H/O left nephrectomy    3. Acquired solitary kidney    4. Mixed urge and  stress incontinence    5. Recurrent UTI        Urinalysis today shows no infection and no blood. PVR 151 ml.  Pt was advised to practice double , timed and  positional voiding to help empty her bladder better.    We will order RUS during the follow up .    Management and care for this patient was reviewed with the supervising physician.          Plan:     Patient Instructions   Timed voiding: urinate every 2-3 hours during the day  Double voiding: urinate then 5 minutes after urinate again during the day  Positional voiding (sit straight up at the end of voiding then lean forward to help empty your bladder)   Please continue D-mannose, Probiotics and Cranberry daily  Follow up in 3 months        Orders  Orders Placed This Encounter   Procedures    Urinalysis POC

## 2022-11-04 ENCOUNTER — Ambulatory Visit
Admission: RE | Admit: 2022-11-04 | Discharge: 2022-11-04 | Disposition: A | Discharge status: Home / Self Care | Payer: Medicare Other | Source: Ambulatory Visit | Attending: Nurse Practitioner | Admitting: Nurse Practitioner

## 2022-11-04 DIAGNOSIS — R102 Pelvic and perineal pain: Secondary | ICD-10-CM | POA: Insufficient documentation

## 2022-11-04 DIAGNOSIS — N393 Stress incontinence (female) (male): Secondary | ICD-10-CM | POA: Insufficient documentation

## 2022-11-04 NOTE — PT Progress Note (Signed)
[facility] Outpatient Physical Therapy  [address]  [address] Floor  [place], [place] [address]  [phone]  [phone]    DAILY TREATMENT NOTE    Patient:  Aimee Wade       MRN#:  [id number]  Referring MD:  [name], NP            Certification Period: 08/24/2022 - 11/22/2022                           Insurance Authorization: required for follow ups     Medical Dx: N 39.3 Stress Incontinence   Therapy Dx: decreased bladder control, decreased bowel control, pelvic pai     Date of Service: 11/04/2022  Time of Service: 1200-1240  Treatment number: 01-28-23      TIMED TREATMENT CODES min UNTIMED TREATMENT CODES units   Manual therapy   PT Evaluation    Therapeutic Exercise  40 Biofeedback    Neuromuscular Re-Ed  Hot/Cold Packs    Therapeutic Activity  E-Stim (unattended)      Assessment: Pt reports improved consistency of HEP. Noted no bowel leakage this week. Needs cuing for breath holding during TrA training.     Plan:  Frequency of treatment: Once a week for 8 weeks  Overall plan for PT:    Pelvic floor muscle training and strengthening, enhanced with cuing, RUSI, biofeedback and electrical stimulation as indicated     Develop personalized HEP for pelvic floor muscle strengthening    HEP of stretching exercises  Manual therapy including intravaginal and external associated muscle groups as indicated  Instruction in behavior modification for urinary urgency    Instruction in pelvic prolapse/pelvic floor protection  Functional constipation tips and  techniques  Next visit:    Cont PFM training  Review and progress HEP   Goals:   Short Term Therapy Goals: x 7 visits    Demonstrate correct technique of PFM contraction exercise for proper strengthening of pelvic floor muscles in order to reduce bowel and bladder incontinence.  2. Decrease urinary incontinence from currently daily to 3x/week  3. Decrease frequency of bowel incontinence from once weekly to once every other week  4. Pt to  report greater ease with passing stool with only min to no straining required majority of the time, per pt self-reporting   5. Pt to verbalize understanding of correct body mechanics for ADLs to allow pt to be as active as possible while decreasing urine leakage with ADLs  6. Min to no pelvic floor muscle tightness and/or tenderness on palpation to indicate decreasing muscle spasm responsible for causing pt's c/o pelvic pain  Long Term Therapy Goal(s)  x 8 visits    1. Independent with HEP and symptom management    Precautions:   standard  Falls Risk:  no  Any pertinent medications?:  no  Change in medications?:   no   Change in condition/hospitalization?:  no    Subjective: Pt states she has been more consistent with PFM exercises this week. Pt reports she has been drinking 8 glasses of water a day.   Pt states she hasn't had any bowel accidents this week.     Pain scale and location (0-10/10):       N/A         Intervention specifically for pain:    N/A       Patient is appropriate for Physical Therapy intervention at this time.  Pt is agreeable to participation in the therapy session.   Risks and benefits reviewed with pt. Verbal consent obtained.     Objective/Treatment Activities:    Instruction in functional constipation techniques: states she has been more constipated lately, instructed in abdominal massage.     Education in behavior modification for urinary urgency    RUSI enhanced pelvic floor muscle training utilizing curvilinear sound head with transabdominal imaging via transverse views: Via observation of bladder base inferred quality of PFM contraction technique.  Provided verbal instruction and verbal cuing for correct technique.  2 sec work/4 sec rest, 2 sets of 10 reps  10 sec work/10 sec rest, 3 reps    TrA training, cuing needed for breath holding.   Access Code: [email or web address]  URL: [email or web address]  Date: 11/04/2022  Prepared by: Vibha Ferdig    Exercises  - Supine  Diaphragmatic Breathing  - 2 x daily - 7 x weekly - 1 sets - 10 reps  - Pelvic Tilt  - 2 x daily - 7 x weekly - 1-3 sets - 10 reps  - Supine Pelvic Floor Stretch  - 2 x daily - 7 x weekly - 1 sets - 3 reps - 20-30 sec hold  - Supine Butterfly Groin Stretch  - 2 x daily - 7 x weekly - 1 sets - 3 reps - 20-30 sec hold  - Supine Bridge  - 1 x daily - 7 x weekly - 1 sets - 10 reps  - Clamshell  - 1 x daily - 7 x weekly - 1 sets - 10 reps  - Clamshell (Mirrored)  - 1 x daily - 7 x weekly - 1 sets - 10 reps  - Quick Flick Pelvic Floor Contractions in Hooklying  - 3-5 x daily - 7 x weekly - 1-2 sets - 10 reps - 2 sec hold - 4 rest   - Seated Pelvic Floor Contraction  - 3-5 x daily - 7 x weekly - 1 sets - 10 reps - 2 sec hold  - Hooklying Transversus Abdominis Palpation  - 1 x daily - 7 x weekly - 1 sets - 10 reps - 5 sec hold  - Supine Hip Adduction Isometric with Ball  - 1 x daily - 7 x weekly - 1-2 sets - 10 reps - 5 sec hold  - Supine Transversus Abdominis Bracing with Double Leg Fallout  - 1 x daily - 7 x weekly - 1 sets - 10 reps    Pt Education:     HEP of PFM exercises as above  Education in behavior modification for urinary urgency  Educated the patient on plan of care, goals of therapy and progression of exercises and/or activities as appropriate.    Freada Bergeron, DPT  Department of Rehabilitation  [facility]

## 2022-11-09 ENCOUNTER — Ambulatory Visit: Payer: Medicare Other

## 2022-11-13 ENCOUNTER — Encounter (INDEPENDENT_AMBULATORY_CARE_PROVIDER_SITE_OTHER): Payer: Self-pay

## 2022-11-13 ENCOUNTER — Telehealth (FREE_STANDING_LABORATORY_FACILITY): Payer: Medicare Other | Admitting: Physician Assistant

## 2022-11-13 VITALS — BP 133/80 | HR 73 | Temp 97.6°F | Resp 18 | Ht 65.0 in | Wt 164.0 lb

## 2022-11-13 DIAGNOSIS — R300 Dysuria: Secondary | ICD-10-CM

## 2022-11-13 DIAGNOSIS — N390 Urinary tract infection, site not specified: Secondary | ICD-10-CM

## 2022-11-13 LAB — MCKESSON POCT UA 120
Bilirubin UA: NEGATIVE
Blood UA: NEGATIVE
Glucose UA: NEGATIVE
Ketone UA: NEGATIVE
Nitrite UA: NEGATIVE
Protein UA: NEGATIVE
Specific Gravity UA: <=1.005
Urobilinogen UA: NEGATIVE
pH UA: 6

## 2022-11-13 MED ORDER — PHENAZOPYRIDINE HCL 200 MG PO TABS
200.0000 mg | ORAL_TABLET | Freq: Three times a day (TID) | ORAL | 0 refills | Status: DC | PRN
Start: 2022-11-13 — End: 2022-11-13

## 2022-11-13 MED ORDER — NITROFURANTOIN MONOHYD MACRO 100 MG PO CAPS
100.0000 mg | ORAL_CAPSULE | Freq: Two times a day (BID) | ORAL | 0 refills | Status: DC
Start: 2022-11-13 — End: 2022-11-13

## 2022-11-13 MED ORDER — CEFPODOXIME PROXETIL 200 MG PO TABS
200.0000 mg | ORAL_TABLET | Freq: Two times a day (BID) | ORAL | 0 refills | Status: AC
Start: 2022-11-13 — End: 2022-11-23

## 2022-11-13 NOTE — Progress Notes (Signed)
Urgent Care Provider Note    Patient: Aimee Wade   Date: 11/13/2022   MRN: [id number]       Subjective     Chief Complaint   Patient presents with    Dysuria     Pt presents for dysuria. Sx include: dysuria, urgency, frequent urination, and pelvic pressure/pain. Sx have been onset for 7 days.        History of Present illness:  HPI    Aimee Wade is a 77 y.o. female who presents with moderate which started 1 week ago and has been stable until last night when it started to significantly worsen. Patient reports dysuria x 1 week with worsenign urinary frequency and urgency. No fever/abd pain/vomiting/flank pain. Only has one kidney. Previously on ppx meds for UTIs but off for a while, last Ucx showed sensitive to most abx.    No LMP recorded. Patient has had a hysterectomy.    Pertinent Past Medical, Surgical, Family and Social History were reviewed.      Current Outpatient Medications:     Alpha-D-Galactosidase (Beano) Tab, Take by mouth as needed, Disp: , Rfl: 0    Apoaequorin (Prevagen) 10 MG Cap, Take 1 capsule (10 mg) by mouth daily, Disp: , Rfl:     atorvastatin (LIPITOR) 10 MG tablet, Take 1 tablet (10 mg) by mouth daily, Disp: 90 tablet, Rfl: 3    D-Mannose 500 MG Cap, Take 2 capsules (1,000 mg) by mouth daily, Disp: , Rfl:     estradiol (ESTRACE VAGINAL) 0.1 MG/GM vaginal cream, Finger tip amount twice weekly (Patient taking differently: Place vaginally twice a week Finger tip amount twice weekly; on Wednesdays and Saturdays), Disp: 42.5 g, Rfl: 2    famotidine (PEPCID) 20 MG tablet, Take 1 tablet (20 mg) by mouth daily, Disp: , Rfl:     loratadine (CLARITIN) 10 MG tablet, Take 1 tablet (10 mg) by mouth as needed, Disp: 30 tablet, Rfl: 11    losartan (COZAAR) 50 MG tablet, Take 1.5 tablets (75 mg) by mouth daily, Disp: 135 tablet, Rfl: 3    Melatonin 3 MG Cap, Take 1 capsule (3 mg) by mouth nightly as needed (sleep aide), Disp: , Rfl: 0    Multiple Vitamin (multivitamin) capsule, Take 1 capsule by mouth  daily, Disp: , Rfl:     NON FORMULARY, Take 1 capsule by mouth daily CRANBERRY PACs, Disp: , Rfl:     Phenazopyridine HCl (AZO TABS PO), Take by mouth as needed (for bladder infection), Disp: , Rfl:     Probiotic Product (PROBIOTIC PO), Take 1 capsule by mouth daily, Disp: , Rfl:     risedronate (ACTONEL) 150 MG tablet, TAKE ONE TABLET BY MOUTH EVERY 30 DAYS, Disp: 12 tablet, Rfl: 0    vitamin D (cholecalciferol) 25 MCG (1000 UT) tablet, Take 1 tablet (25 mcg) by mouth daily, Disp: , Rfl:     acetaminophen (Tylenol 8 Hour Arthritis Pain) 650 MG CR tablet, Take 1 tablet (650 mg) by mouth daily, Disp: , Rfl:     amoxicillin (AMOXIL) 500 MG capsule, , Disp: , Rfl:     cefpodoxime (VANTIN) 200 MG tablet, Take 1 tablet (200 mg) by mouth 2 (two) times daily for 10 days, Disp: 20 tablet, Rfl: 0    Allergies   Allergen Reactions    Codeine Nausea Only     Other reaction(s): Unknown    Morphine Nausea Only     Other reaction(s): Unknown    Fructose Diarrhea  Sesame Oil     Soybean-Containing Drug Products        Medications and Allergies reviewed.         Objective     Vitals:    11/13/22 1501   BP: 133/80   Pulse: 73   Resp: 18   Temp: 97.6 F (36.4 C)   SpO2: 96%       Physical Exam    General: no acute distress, well developed, well nourished.    Heart: regular rate    Lungs: normal work of breathing    Abdomen: soft, non tender, and no CVA tenderness     Back: No CVA tenderness    UCC COURSE  LABS  The following POCT tests were ordered, reviewed and discussed with the patient/family.     Results       Procedure Component Value Units Date/Time    UA [[id number]]  (Abnormal) Collected: 11/13/22 1538    Specimen: Clean Catch Updated: 11/13/22 1539     Color, UA Light Yellow     Clarity, UA Cloudy     Leukocytes UA 3+ (500 Leu/ul)     Nitrite UA Negative     Urobilinogen UA Negative (0.2 mg/dl)     Protein UA Negative     pH UA 6.0     Blood UA Negative     Specific Gravity UA <=1.005     Ketone UA Negative     Bilirubin  UA Negative     Glucose UA Negative            There were no x-rays reviewed with this patient during the visit.    No current facility-administered medications for this visit.             PROCEDURES:  Procedures    MDM:  DDx: UTI, pyelonephritis, nephrolithiasis, urethritis, vaginitis, PID, cystitis    Patient with signs and symptoms consistent with a UTI.  VSS, exam is reassuring, no red flag symptoms.  UA shows +leuks. Given patient has one kidney will tx as complicated UTI and cover for vantin.   Culture sent.  Follow-up with PCP closely.  ER precautions discussed.        Assessment         Aimee Wade was seen today for dysuria.    Diagnoses and all orders for this visit:    Acute UTI  -     UA; Future  -     UA  -     Urine culture    Dysuria    Other orders  -     Discontinue: phenazopyridine (PYRIDIUM) 200 MG tablet; Take 1 tablet (200 mg) by mouth 3 (three) times daily as needed for Pain  -     Discontinue: nitrofurantoin, macrocrystal-monohydrate, (MACROBID) 100 MG capsule; Take 1 capsule (100 mg) by mouth 2 (two) times daily for 5 days  -     cefpodoxime (VANTIN) 200 MG tablet; Take 1 tablet (200 mg) by mouth 2 (two) times daily for 10 days        Plan and follow-up discussed with patient. See AVS for further documentation.

## 2022-11-13 NOTE — Patient Instructions (Signed)
Take antibiotics as prescribed to completion and do not miss doses.  Your urine culture is pending, you will get a call from us if there is resistance to the antibiotics that we have prescribed today.  You can use over-the-counter Azo for symptom control.    Stay well-hydrated.  Follow-up with your primary care doctor if not improving in 1 week.  Return or go to the ED you have worsening symptoms including severe pain, flank pain, fever, vomiting, or other acute concerns.

## 2022-11-18 ENCOUNTER — Ambulatory Visit
Admission: RE | Admit: 2022-11-18 | Discharge: 2022-11-18 | Disposition: A | Discharge status: Home / Self Care | Payer: Medicare Other | Source: Ambulatory Visit

## 2022-11-18 NOTE — PT Progress Note (Signed)
[facility] Outpatient Physical Therapy  [address]  [address]  [place], [place] [address]  [phone]  [phone]    DAILY TREATMENT NOTE    Patient:  Aimee Wade       MRN#:  [id number]  Referring MD:  [name], NP            Certification Period: 08/24/2022 - 11/22/2022         Insurance Authorization:  required for follow ups     Medical Dx: N 39.3 Stress Incontinence   Therapy Dx: decreased bladder control, decreased bowel control, pelvic pain    Date of Service: 11/18/2022  Time of Service: 14:02-14:50  Treatment number: February 06, 2023      TIMED TREATMENT CODES min UNTIMED TREATMENT CODES units   Manual therapy 15  PT Evaluation    Therapeutic Exercise  33 Biofeedback    Neuromuscular Re-Ed  Hot/Cold Packs    Therapeutic Activity  E-Stim (unattended)      Assessment: Pt with some difficulty isolating PFM contraction this visit with overflow to abdominals, glutes and breath holding noted. Focused on submax contraction followed by full relaxation and elongating PMF.         Plan:  Frequency of treatment: Once a week for 8 weeks  Overall plan for PT:    Pelvic floor muscle training and strengthening, enhanced with cuing, RUSI, biofeedback and electrical stimulation as indicated     Develop personalized HEP for pelvic floor muscle strengthening    HEP of stretching exercises  Manual therapy including intravaginal and external associated muscle groups as indicated  Instruction in behavior modification for urinary urgency    Instruction in pelvic prolapse/pelvic floor protection  Functional constipation tips and  techniques  Next visit:    Cont PFM training  Review and progress HEP   Goals:   Short Term Therapy Goals: x 7 visits    Demonstrate correct technique of PFM contraction exercise for proper strengthening of pelvic floor muscles in order to reduce bowel and bladder incontinence.  2. Decrease urinary incontinence from currently daily to 3x/week  3. Decrease frequency of bowel  incontinence from once weekly to once every other week  4. Pt to report greater ease with passing stool with only min to no straining required majority of the time, per pt self-reporting   5. Pt to verbalize understanding of correct body mechanics for ADLs to allow pt to be as active as possible while decreasing urine leakage with ADLs  6. Min to no pelvic floor muscle tightness and/or tenderness on palpation to indicate decreasing muscle spasm responsible for causing pt's c/o pelvic pain  Long Term Therapy Goal(s)  x 8 visits    1. Independent with HEP and symptom management    Precautions:   standard  Falls Risk:  no  Any pertinent medications?:  no  Change in medications?:   no   Change in condition/hospitalization?:  no      Subjective: Pt had episode of diarrhea followed by UTI. Pt reports last urine leakage was two or three days ago.    Pain scale and location (0-10/10):       N/A         Intervention specifically for pain:    N/A       Patient is appropriate for Physical Therapy intervention at this time.  Pt is agreeable to participation in the therapy session.   Risks and benefits reviewed with pt. Verbal consent obtained.  Objective/Treatment Activities:   Reviewed use of squatty potty even for urinating to encourage relaxation, reduce retention     Education in behavior modification for urinary urgency, finds it difficult to use bladder training and uses bathroom "just in case" often     RUSI enhanced pelvic floor muscle training utilizing curvilinear sound head with transabdominal imaging via transverse views: Via observation of bladder base inferred quality of PFM contraction technique. Good image quality but little to no lift through bladder base visualized. Cuing for overflow and breath holding.   Provided verbal instruction and verbal cuing for correct technique.  2 sec work/4 sec rest, 2 sets of 10 reps    Performed and instructed pt in scar tissue mobilization    Review of HEP:   Access Code:  [email or web address]  URL: [email or web address]  Date: 11/18/2022  Prepared by: Ulysess Witz     Exercises  - Supine Diaphragmatic Breathing  - 2 x daily - 7 x weekly - 1 sets - 10 reps  - Pelvic Tilt  - 2 x daily - 7 x weekly - 1-3 sets - 10 reps  - Supine Pelvic Floor Stretch  - 2 x daily - 7 x weekly - 1 sets - 3 reps - 20-30 sec hold  - Supine Butterfly Groin Stretch  - 2 x daily - 7 x weekly - 1 sets - 3 reps - 20-30 sec hold  - Supine Bridge  - 1 x daily - 7 x weekly - 1 sets - 10 reps  - Clamshell  - 1 x daily - 7 x weekly - 1 sets - 10 reps  - Clamshell (Mirrored)  - 1 x daily - 7 x weekly - 1 sets - 10 reps  - Quick Flick Pelvic Floor Contractions in Hooklying  - 3-5 x daily - 7 x weekly - 1-2 sets - 10 reps - 2 sec hold - 4 rest   - Seated Pelvic Floor Contraction  - 3-5 x daily - 7 x weekly - 1 sets - 10 reps - 2 sec hold  - Hooklying Transversus Abdominis Palpation  - 1 x daily - 7 x weekly - 1 sets - 10 reps - 5 sec hold  - Supine Hip Adduction Isometric with Ball  - 1 x daily - 7 x weekly - 1-2 sets - 10 reps - 5 sec hold  - Supine Transversus Abdominis Bracing with Double Leg Fallout  - 1 x daily - 7 x weekly - 1 sets - 10 reps    Pt Education:     HEP of PFM exercises as above  Education in functional constipation techniques  Education in behavior modification for urinary urgency  Educated the patient on plan of care, goals of therapy and progression of exercises and/or activities as appropriate.    Freada Bergeron, DPT  Department of Rehabilitation  [facility]

## 2022-11-19 ENCOUNTER — Ambulatory Visit (INDEPENDENT_AMBULATORY_CARE_PROVIDER_SITE_OTHER): Payer: Medicare Other

## 2022-11-19 DIAGNOSIS — I10 Essential (primary) hypertension: Secondary | ICD-10-CM

## 2022-11-19 DIAGNOSIS — E785 Hyperlipidemia, unspecified: Secondary | ICD-10-CM

## 2022-11-20 ENCOUNTER — Telehealth (INDEPENDENT_AMBULATORY_CARE_PROVIDER_SITE_OTHER): Payer: Self-pay

## 2022-11-20 NOTE — Telephone Encounter (Signed)
Chartspan Message:    UC visit.  Patient Aimee Wade, DOB Nov 10, 1945, stated she went to urgent care on 11/13/2022 for a UTI and was given some antibiotics that she is still taking.  No further details were provided.  We wanted to update the provider.  Please let us know if we can be of any further assistance. Thank you.

## 2022-12-23 ENCOUNTER — Ambulatory Visit (INDEPENDENT_AMBULATORY_CARE_PROVIDER_SITE_OTHER): Payer: Medicare Other

## 2022-12-23 DIAGNOSIS — E785 Hyperlipidemia, unspecified: Secondary | ICD-10-CM

## 2022-12-23 DIAGNOSIS — I10 Essential (primary) hypertension: Secondary | ICD-10-CM

## 2023-01-02 ENCOUNTER — Ambulatory Visit (INDEPENDENT_AMBULATORY_CARE_PROVIDER_SITE_OTHER): Payer: Medicare Other

## 2023-01-02 DIAGNOSIS — E785 Hyperlipidemia, unspecified: Secondary | ICD-10-CM

## 2023-01-02 DIAGNOSIS — I10 Essential (primary) hypertension: Secondary | ICD-10-CM

## 2023-01-04 ENCOUNTER — Telehealth (INDEPENDENT_AMBULATORY_CARE_PROVIDER_SITE_OTHER): Payer: Self-pay

## 2023-01-04 NOTE — Telephone Encounter (Signed)
Chartspan message:    Lorain Childes - We spoke with Patient Aimee Wade DOB 01/24/1946, for a nurse triage call.  Chief Complaint: 2 weeks ago she hit the inside of her ring finger on her left hand ( a ring is still on it) and she felt the vein pop. She held it up and put ice on it. Now she has a lump under the skin and it's painful and red but not inflamed.  --- The nurse contacted the patient to triage, she reported mild pain and stated the bruising had resolved but the swelling and lump remained. She stated she cannot take off her ring, but this is not new. The call has been dispositioned as See in Office Within 3 Days, however, the patient has elected to try to use Home Care advice first, please see the close note for full triage details. -- We wanted to notify the provider of this call. Please let us know if we can be of further assistance. -- Thank you. Joanne Gavel

## 2023-01-08 ENCOUNTER — Ambulatory Visit (FREE_STANDING_LABORATORY_FACILITY): Payer: Medicare Other | Admitting: Physician Assistant

## 2023-01-08 ENCOUNTER — Other Ambulatory Visit (INDEPENDENT_AMBULATORY_CARE_PROVIDER_SITE_OTHER): Payer: Self-pay | Admitting: Internal Medicine

## 2023-01-08 ENCOUNTER — Encounter (INDEPENDENT_AMBULATORY_CARE_PROVIDER_SITE_OTHER): Payer: Self-pay

## 2023-01-08 VITALS — BP 147/84 | HR 68 | Temp 98.0°F | Resp 19 | Wt 164.0 lb

## 2023-01-08 DIAGNOSIS — N3090 Cystitis, unspecified without hematuria: Secondary | ICD-10-CM

## 2023-01-08 DIAGNOSIS — N3091 Cystitis, unspecified with hematuria: Secondary | ICD-10-CM

## 2023-01-08 LAB — MCKESSON POCT UA 120
Bilirubin UA: NEGATIVE
Glucose UA: NEGATIVE
Ketone UA: NEGATIVE
Nitrite UA: POSITIVE — AB
Specific Gravity UA: <=1.005
Urobilinogen UA: NEGATIVE
pH UA: 6

## 2023-01-08 MED ORDER — NITROFURANTOIN MONOHYD MACRO 100 MG PO CAPS
100.0000 mg | ORAL_CAPSULE | Freq: Two times a day (BID) | ORAL | 0 refills | Status: AC
Start: 2023-01-08 — End: 2023-01-13

## 2023-01-08 NOTE — Progress Notes (Signed)
[facility]  URGENT  CARE  PROGRESS NOTE     Patient: Aimee Wade   Date: 01/08/2023   MRN: [id number]       Aimee Wade is a 77 y.o. female      HISTORY     History obtained from: Patient    Chief Complaint   Patient presents with    Urinary Tract Infection Symptoms     Pt with UTI symptoms since this morning with pain and urgency. Denies fever         HPI patient presents with urinary discomfort that began this morning.  She took Azo, denies any fevers or back pain, due to the symptoms she is coming for concern for UTI.    Review of Systems ROS negative unless noted in HPI    History:    Pertinent Past Medical, Surgical, Family and Social History were reviewed.        Current Outpatient Medications:     acetaminophen (Tylenol 8 Hour Arthritis Pain) 650 MG CR tablet, Take 1 tablet (650 mg) by mouth daily, Disp: , Rfl:     Alpha-D-Galactosidase (Beano) Tab, Take by mouth as needed, Disp: , Rfl: 0    Apoaequorin (Prevagen) 10 MG Cap, Take 1 capsule (10 mg) by mouth daily, Disp: , Rfl:     atorvastatin (LIPITOR) 10 MG tablet, Take 1 tablet (10 mg) by mouth daily, Disp: 90 tablet, Rfl: 3    D-Mannose 500 MG Cap, Take 2 capsules (1,000 mg) by mouth daily, Disp: , Rfl:     famotidine (PEPCID) 20 MG tablet, Take 1 tablet (20 mg) by mouth daily, Disp: , Rfl:     loratadine (CLARITIN) 10 MG tablet, Take 1 tablet (10 mg) by mouth as needed, Disp: 30 tablet, Rfl: 11    losartan (COZAAR) 50 MG tablet, Take 1.5 tablets (75 mg) by mouth daily, Disp: 135 tablet, Rfl: 3    Melatonin 3 MG Cap, Take 1 capsule (3 mg) by mouth nightly as needed (sleep aide), Disp: , Rfl: 0    Multiple Vitamin (multivitamin) capsule, Take 1 capsule by mouth daily, Disp: , Rfl:     NON FORMULARY, Take 1 capsule by mouth daily CRANBERRY PACs, Disp: , Rfl:     Phenazopyridine HCl (AZO TABS PO), Take by mouth as needed (for bladder infection), Disp: , Rfl:     Probiotic Product (PROBIOTIC PO), Take 1 capsule by mouth daily, Disp: , Rfl:     risedronate  (ACTONEL) 150 MG tablet, TAKE ONE TABLET BY MOUTH EVERY 30 DAYS, Disp: 12 tablet, Rfl: 0    vitamin D (cholecalciferol) 25 MCG (1000 UT) tablet, Take 1 tablet (25 mcg) by mouth daily, Disp: , Rfl:     amoxicillin (AMOXIL) 500 MG capsule, , Disp: , Rfl:     estradiol (ESTRACE VAGINAL) 0.1 MG/GM vaginal cream, Finger tip amount twice weekly (Patient taking differently: Place vaginally twice a week Finger tip amount twice weekly; on Wednesdays and Saturdays), Disp: 42.5 g, Rfl: 2    nitrofurantoin, macrocrystal-monohydrate, (MACROBID) 100 MG capsule, Take 1 capsule (100 mg) by mouth 2 (two) times daily for 5 days, Disp: 10 capsule, Rfl: 0    Allergies   Allergen Reactions    Codeine Nausea Only     Other reaction(s): Unknown    Morphine Nausea Only     Other reaction(s): Unknown    Fructose Diarrhea    Sesame Oil     Soybean-Containing Drug Products  Medications and Allergies reviewed.    PHYSICAL EXAM     Vitals:    01/08/23 1435   BP: 147/84   Pulse: 68   Resp: 19   Temp: 98 F (36.7 C)   SpO2: 99%   Weight: 74.4 kg (164 lb)       Physical Exam Vitals and nursing note reviewed.   Constitutional:       General: Not in acute distress.     Appearance: Normal appearance. Not ill-appearing or toxic-appearing.   HENT:      Head: Normocephalic and atraumatic.   Eyes:      Conjunctiva/sclera: Conjunctivae normal.   Neck:      Musculoskeletal: Normal range of motion.   Respiratory:      Normal effort. Able to speak in full sentences.  Neurological:      Mental Status: Alert and oriented.  Psychiatric:         Mood and Affect: Mood normal.         Behavior: Behavior normal.        UCC COURSE     LABS  The following POCT tests were ordered, reviewed and discussed with the patient/family.     Results       Procedure Component Value Units Date/Time    Culture, Urine [[id number]] Collected: 01/08/23 1516    Specimen: Urine, Clean Catch Updated: 01/08/23 1516    [employer] POCT UA [[id number]]  (Abnormal) Collected: 01/08/23  1444    Specimen: Clean Catch Updated: 01/08/23 1445     Color, UA Orange     Clarity, UA Cloudy     Leukocytes UA 3+ (500 Leu/ul)     Nitrite UA Positive     Urobilinogen UA Negative (0.2 mg/dl)     Protein UA Trace (15mg /dl)     pH UA 6.0     Blood UA 3+ (200 Ery/ul)     Specific Gravity UA <=1.005     Ketone UA Negative     Bilirubin UA Negative     Glucose UA Negative            There were no x-rays reviewed with this patient during the visit.    No current facility-administered medications for this visit.       PROCEDURES     Procedures    MEDICAL DECISION MAKING     History, physical, labs/studies most consistent with UTI as the diagnosis.        Chart Review:  Prior PCP, Specialist and/or ED notes reviewed today: No  Prior labs/images/studies reviewed today: No    Differential Diagnosis: pyelonephritis, nephrolithiasis, urethritis, vaginitis, PID, cystitis        ASSESSMENT     Encounter Diagnoses   Name Primary?    Bladder infection     Cystitis with hematuria Yes                PLAN      PLAN: treating for uti due to urine.              Orders Placed This Encounter   Procedures    Culture, Urine    [employer] POCT UA     Requested Prescriptions     Signed Prescriptions Disp Refills    nitrofurantoin, macrocrystal-monohydrate, (MACROBID) 100 MG capsule 10 capsule 0     Sig: Take 1 capsule (100 mg) by mouth 2 (two) times daily for 5 days       Discussed results and diagnosis with patient/family.  Reviewed warning signs for worsening condition, as well as, indications for follow-up with primary care physician and return to urgent care clinic.   Patient/family expressed understanding of instructions.     An After Visit Summary was provided to the patient.

## 2023-01-08 NOTE — Patient Instructions (Signed)
Take the Macrobid twice a day with food and a full glass of water.  Drink plenty of fluids.   If you experience fevers, nausea, back pain, or worsening symptoms, please go to the emergency room.

## 2023-01-09 LAB — CULTURE, URINE

## 2023-01-13 ENCOUNTER — Ambulatory Visit
Admission: RE | Admit: 2023-01-13 | Discharge: 2023-01-13 | Disposition: A | Discharge status: Home / Self Care | Payer: Medicare Other | Source: Ambulatory Visit | Attending: Nurse Practitioner | Admitting: Nurse Practitioner

## 2023-01-13 DIAGNOSIS — N393 Stress incontinence (female) (male): Secondary | ICD-10-CM | POA: Insufficient documentation

## 2023-01-13 DIAGNOSIS — R102 Pelvic and perineal pain: Secondary | ICD-10-CM | POA: Insufficient documentation

## 2023-01-13 NOTE — PT Progress Note (Signed)
[facility] Outpatient Physical Therapy  [address]  [address] Floor  [place], [place] [address]  [phone]  [phone]    DAILY TREATMENT NOTE    Patient:  Aimee Wade       MRN#:  [id number]  Referring MD:  [name], NP            Certification Period: 01/13/2023-04/15/2023        Insurance Authorization: required for follow ups     Medical Dx: N 39.3 Stress Incontinence   Therapy Dx: decreased bladder control, decreased bowel control, pelvic pain    Date of Service: 01/13/2023  Time of Service: 1200-1240  Treatment number: 07/29/22      TIMED TREATMENT CODES min UNTIMED TREATMENT CODES units   Manual therapy   PT Evaluation    Therapeutic Exercise 40  Biofeedback    Neuromuscular Re-Ed  Hot/Cold Packs    Therapeutic Activity  E-Stim (unattended)      Assessment: Pt continues to have difficulty with breath holding, especially during bed mobility and transfers. Minimal PFM contraction visualized via RUSI despite max cuing.    Plan:  Frequency of treatment: Once a week for 4 weeks  Overall plan for PT:    Pelvic floor muscle training and strengthening, enhanced with cuing, RUSI, biofeedback and electrical stimulation as indicated     Develop personalized HEP for pelvic floor muscle strengthening    HEP of stretching exercises  Manual therapy including intravaginal and external associated muscle groups as indicated  Instruction in behavior modification for urinary urgency    Instruction in pelvic prolapse/pelvic floor protection  Functional constipation tips and  techniques  Next visit:    Cont PFM training  Review pelvic floor protection/breath holding  Goals:   Short Term Therapy Goals: x 7 visits    Demonstrate correct technique of PFM contraction exercise for proper strengthening of pelvic floor muscles in order to reduce bowel and bladder incontinence.  2. Decrease urinary incontinence from currently daily to 3x/week  3. Decrease frequency of bowel incontinence from once weekly  to once every other week  4. Pt to report greater ease with passing stool with only min to no straining required majority of the time, per pt self-reporting   5. Pt to verbalize understanding of correct body mechanics for ADLs to allow pt to be as active as possible while decreasing urine leakage with ADLs  6. Min to no pelvic floor muscle tightness and/or tenderness on palpation to indicate decreasing muscle spasm responsible for causing pt's c/o pelvic pain  Long Term Therapy Goal(s)  x 8 visits    1. Independent with HEP and symptom management    Precautions:   standard  Falls Risk:  no  Any pertinent medications?:  no  Change in medications?:   no   Change in condition/hospitalization?:  no    Subjective: Pt reports she has been doing the exercises when she remembers. Had episode of diarrhea with subsequent UTI, hadn't had one in awhile.   Pt states urinary frequency improved, urinating every three hours or so. Reports before recent UTI wasn't experiencing much urine leakage. Still getting up twice a night to urinate.     Pain scale and location (0-10/10):       N/A         Intervention specifically for pain:    N/A       Patient is appropriate for Physical Therapy intervention at this time.  Pt is agreeable  to participation in the therapy session.   Risks and benefits reviewed with pt. Verbal consent obtained.     Objective/Treatment Activities:   Instruction in functional constipation techniques: reviewed use of squatty potty     Education in behavior modification for urinary urgency, reinforced use of bladder training/urge suppression     RUSI enhanced pelvic floor muscle training utilizing curvilinear sound head with transabdominal imaging via transverse views: Via observation of bladder base inferred quality of PFM contraction technique.  Provided verbal instruction and verbal cuing for correct technique.  2 sec work/4 sec rest, 2 sets of 10 reps  10 sec work/10 sec rest, 2 reps    TE as follows:  Access  Code: [email or web address]  URL: [email or web address]  Date: 01/13/2023  Prepared by: Shayli Altemose    Exercises  - Supine Diaphragmatic Breathing  - 2 x daily - 7 x weekly - 1 sets - 10 reps  - Quick Flick Pelvic Floor Contractions in Hooklying  - 3-5 x daily - 7 x weekly - 1-2 sets - 10 reps - 2 sec hold - 4 rest   - Seated Pelvic Floor Contraction  - 3-5 x daily - 7 x weekly - 1 sets - 10 reps - 2 sec hold  - Supine Bridge  - 1 x daily - 7 x weekly - 1-3 sets - 10 reps  - Clamshell  - 1 x daily - 7 x weekly - 1-3 sets - 10 reps  - Supine Hip Adduction Isometric with Ball  - 1 x daily - 7 x weekly - 1-3 sets - 10 reps - 5 sec hold    Pt Education:     HEP of PFM and core exercises as above  Education in functional constipation techniques  Education in behavior modification for urinary urgency  Education in pelvic floor protection  Educated the patient on plan of care, goals of therapy and progression of exercises and/or activities as appropriate.    Freada Bergeron, DPT  Department of Rehabilitation  [facility]

## 2023-01-13 NOTE — PT Plan of Care Note (Signed)
[facility]  [address]  [address]  [place], [place] [address]  P: [phone]  F: [phone]      Patient: Aimee Wade       MRN: [id number]   Date of Birth: Nov 07, 1945  Age: 77 y.o.    Physician Agreement of Patient Plan of Care:   Date: 01/13/2023    Dear [name], NP     Thank you for allowing [place] to participate in the care of Aimee Wade. Enclosed is the Plan of Care for Physical Therapy for this patient.     Regulations from the Center for Medicare and Medicaid Services (CMS) require your review and approval of this plan of care. Please note that since a prescription or order for therapy does not legally constitute approval of plan of care, we will not be able to proceed with therapy until we receive your approval of the plan of care.       Please co-sign this document (if you are an Armed forces technical officer) or print, sign, and date the Physician Certification Statement below and fax this document back to [place] within 3 business days of receipt so we can initiate the therapy treatment plan.     Sincerely,     Elon Jester  Outpatient Clinical Manager   Senior Director  Rehabilitation [facility]            Physician Certification  I have read and agreed with the plan of care for the above named patient who is under my care.     Medical Dx: N 39.3 Stress Incontinence   Therapy Dx: decreased bladder control, decreased bowel control, pelvic pain    Frequency: 1 times per week for 4 visits    Certification Period: 01/13/2023 - 04/15/2023  Therapeutic Interventions Planned: 81829 PT Re-Evaluation, 97110 Therapeutic Exercises, to include home exercise program., 97140 Manual Therapy, 97530 Therapeutic Activities, to include functional mobility training., 93716 Biofeedback PUG Initial 15 min, 90913 Biofeedback PUG Each Addl 15 min, 97014 Estim unattended, and 97032 EStim Manual, Attended      Physician Signature: ________________________________ Date: __________    Physician NPI: _____________________________    Please fax completed document to [facility], Department of Rehabilitation at [phone]      Treatment Plan:   Short Term Therapy Goals: x 4 visits    Demonstrate correct technique of PFM contraction exercise for proper strengthening of pelvic floor muscles in order to reduce bowel and bladder incontinence.  2. Decrease urinary incontinence from currently weekly to couple times per month.  3. Decrease frequency of bowel incontinence to 1-2x/month.  4. Pt to report greater ease with passing stool with only min to no straining required majority of the time, per pt self-reporting -partially met  5. Pt to verbalize understanding of correct body mechanics for ADLs to allow pt to be as active as possible while decreasing urine leakage with ADLs  6. Min to no pelvic floor muscle tightness and/or tenderness on palpation to indicate decreasing muscle spasm responsible for causing pt's c/o pelvic pain  Long Term Therapy Goal(s)  x 4 visits    1. Independent with HEP and symptom management    Assessment: Pt progressing with symptom management, reports prior to recent UTI, urinary incontinence was closer to weekly rather than daily. Continues to have difficulty with PFM isolation and breath holding. Will benefit from cont PT to  address these deficits as indicated below:  Pelvic floor muscle training and strengthening, enhanced with cuing, RUSI, biofeedback and electrical stimulation as indicated     Develop personalized HEP for pelvic floor muscle strengthening    HEP of stretching exercises  Manual therapy including intravaginal and external associated muscle groups as indicated  Instruction in behavior modification for urinary urgency    Instruction in pelvic prolapse/pelvic floor protection  Functional constipation tips and  techniques      Signature: Freada Bergeron, DPT

## 2023-01-18 ENCOUNTER — Ambulatory Visit
Admission: RE | Admit: 2023-01-18 | Discharge: 2023-01-18 | Disposition: A | Discharge status: Home / Self Care | Payer: Medicare Other | Source: Ambulatory Visit

## 2023-01-18 NOTE — PT Progress Note (Signed)
[facility] Outpatient Physical Therapy  [address]  [address]  [place], [place] [address]  [phone]  [phone]    DAILY TREATMENT NOTE    Patient:  Aimee Wade       MRN#:  [id number]  Referring MD:  [name], NP            Certification Period: 01/13/2023 - 04/15/2023       Insurance Authorization: required for follow ups     Medical Dx: N 39.3 Stress Incontinence   Therapy Dx: decreased bladder control, decreased bowel control, pelvic pain    Date of Service: 01/18/2023  Time of Service: 10:02-10:48  Treatment number: 2/4      TIMED TREATMENT CODES min UNTIMED TREATMENT CODES units   Manual therapy   PT Evaluation    Therapeutic Exercise 46  Biofeedback    Neuromuscular Re-Ed  Hot/Cold Packs    Therapeutic Activity  E-Stim (unattended)      Assessment: Pt remains with some breath holding during Tra training, but reports she has been more aware of her breathing during transfers at home. Reviewed bladder training especially for nocturia.         Plan:  Frequency of treatment: Once a week for 4 weeks  Overall plan for PT:    Pelvic floor muscle training and strengthening, enhanced with cuing, RUSI, biofeedback and electrical stimulation as indicated     Develop personalized HEP for pelvic floor muscle strengthening    HEP of stretching exercises  Manual therapy including intravaginal and external associated muscle groups as indicated  Instruction in behavior modification for urinary urgency    Instruction in pelvic prolapse/pelvic floor protection  Functional constipation tips and  techniques  Next visit:    Cont PFM training  Review bladder training/urge suppression   Goals:   Short Term Therapy Goals: x 4 visits    Demonstrate correct technique of PFM contraction exercise for proper strengthening of pelvic floor muscles in order to reduce bowel and bladder incontinence.  2. Decrease urinary incontinence from currently weekly to couple times per month.  3. Decrease  frequency of bowel incontinence to 1-2x/month.  4. Pt to report greater ease with passing stool with only min to no straining required majority of the time, per pt self-reporting -partially met  5. Pt to verbalize understanding of correct body mechanics for ADLs to allow pt to be as active as possible while decreasing urine leakage with ADLs  6. Min to no pelvic floor muscle tightness and/or tenderness on palpation to indicate decreasing muscle spasm responsible for causing pt's c/o pelvic pain  Long Term Therapy Goal(s)  x 4 visits    1. Independent with HEP and symptom management    Precautions:   standard  Falls Risk:  no  Any pertinent medications?:  no  Change in medications?:   no   Change in condition/hospitalization?:  no      Subjective: pt reports she was still feeling symptoms of her UTI so started additional antibiotics and is feeling better now, less frequency     Pain scale and location (0-10/10):       N/A         Intervention specifically for pain:    N/A       Patient is appropriate for Physical Therapy intervention at this time.  Pt is agreeable to participation in the therapy session.   Risks and benefits reviewed with pt. Verbal consent obtained.  Objective/Treatment Activities:   Reviewed education in behavior modification for nocturia, instructed in bladder training and urge suppression techniques, handout provided  Reviewed use of squatty potty and diaphragmatic breathing while toileting    RUSI enhanced pelvic floor muscle training utilizing curvilinear sound head with transabdominal imaging via transverse views: Via observation of bladder base inferred quality of PFM contraction technique.  Provided verbal instruction and verbal cuing for correct technique.  2 sec work/4 sec rest, 2 sets of 10 reps  10 sec work/10 sec rest, 5 reps    TrA training with theraball, verbal and tactile cuing performed. TE as follows.  Access Code: [email or web address]  URL: [email or web address]  Date:  01/13/2023  Prepared by: Rosha Cocker     Exercises  - Supine Diaphragmatic Breathing  - 2 x daily - 7 x weekly - 1 sets - 10 reps  - Quick Flick Pelvic Floor Contractions in Hooklying  - 3-5 x daily - 7 x weekly - 1-2 sets - 10 reps - 2 sec hold - 4 rest   - Seated Pelvic Floor Contraction  - 3-5 x daily - 7 x weekly - 1 sets - 10 reps - 2 sec hold  - Supine Bridge  - 1 x daily - 7 x weekly - 1-3 sets - 10 reps  - Clamshell  - 1 x daily - 7 x weekly - 1-3 sets - 10 reps  - Supine Hip Adduction Isometric with Ball  - 1 x daily - 7 x weekly - 1-3 sets - 10 reps - 5 sec hold    Pt Education:     HEP of PFM exercises as above  Education in behavior modification for urinary urgency  Educated the patient on plan of care, goals of therapy and progression of exercises and/or activities as appropriate.    Freada Bergeron, DPT  Department of Rehabilitation  [facility]

## 2023-01-20 ENCOUNTER — Encounter (INDEPENDENT_AMBULATORY_CARE_PROVIDER_SITE_OTHER): Payer: Self-pay | Admitting: Internal Medicine

## 2023-01-20 ENCOUNTER — Institutional Professional Consult (permissible substitution) (INDEPENDENT_AMBULATORY_CARE_PROVIDER_SITE_OTHER): Payer: Medicare Other | Admitting: Internal Medicine

## 2023-01-20 VITALS — BP 108/66 | HR 73 | Temp 97.4°F | Resp 16 | Ht 65.0 in | Wt 164.4 lb

## 2023-01-20 DIAGNOSIS — Z01818 Encounter for other preprocedural examination: Secondary | ICD-10-CM

## 2023-01-20 MED ORDER — RISEDRONATE SODIUM 150 MG PO TABS
150.0000 mg | ORAL_TABLET | ORAL | 0 refills | Status: AC
Start: 2023-01-20 — End: ?

## 2023-01-20 NOTE — Progress Notes (Signed)
[facility] INTERNAL MEDICINE - AN [place] PARTNER                  Date of Exam: 01/20/2023         Patient ID: Aimee Wade is a 77 y.o. female.        Chief Complaint:    Chief Complaint   Patient presents with    Pre-op Exam     Cataracts 8/7, EKG and physical needed             HPI:    HPI  Visit Type: Pre-operative Evaluation  Date of Surgery: 02/03/23 and 02/23/23  Surgeon: Dr. Josephine Igo MD  Fax Number (Required): [phone]  Recent Health (admits): urinary frequency chronic  Recent Health (denies): fever, chest pain, cough, nausea, vomiting, diarrhea, dyspnea, dysuria, abdominal pain, easy bruising, and LE swelling  Exercise Tolerance: 4 met (i.e. climbing stairs )  Surgical Risk Factors: hypertension  Prior Anesthesia: Patient reports adverse reaction to anesthesia in the past consisting of the following symptoms: nausea    01/20/23:  Overall feeling well, here for pre-op cataract surgery.        ROS:      PREOPERATIVE REVIEW OF SYSTEMS:    Positive:  Patient reports difficulty walking up and down 8 steps without stopping due to right right knee pain.  Patient reports not being able to carry 25 lb up 8 steps without stopping due to right knee pain.  Patient reports sleeping on 2 pillows due to GERD, denies orthopnea.   Patient reports getting up to urinate 2-3 times nightly, chronic.    Negative:  Patient reports no history of MRSA.  Patient reports not fainting in the past couple of years.  Patient reports never having had a seizure.  Patient reports never having a stroke, near stroke or TIA.  Patient reports never having had phlebitis or blood clots in arms or legs.  Patient reports no cough.  Patient reports no pressure, pain, tightness or other chest discomfort.  Patient reports being able to walk on level at a normal pace for 20 minutes.  Patient reports not stopping to rest after walking one or two blocks.  Patient reports not stopping to rest going from room to room in the house.  Patient  reports not being short of breath just sitting.  Patient reports not having sleep apnea.  Patient reports not having been OBSERVED stopping breathing during sleep.  Patient reports no problems with bowel movements.  Patient reports no abdominal discomfort.  Patient reports no recent vomiting.   Patient reports no problems chewing or swallowing.  Patient reports no problem with rashes.  Patient reports no aches.  Patient reports no leg cramps.              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    Erosive lichen planus of vagina    Nocturia  Essential hypertension    Pelvic pain in female    History of colon polyps    Melanocytic nevi of trunk    Postmenopausal    Hyperlipidemia    Gastroesophageal reflux disease    Tubular adenoma of colon    Hypertension, unspecified type             Current Meds:    Medications Taking[2]  Outpatient Medications Marked as Taking for the 01/20/23 encounter (Surgical Consult) with Iris Pert, MD   Medication Sig Dispense Refill    acetaminophen (Tylenol 8 Hour Arthritis Pain) 650 MG CR tablet Take 1 tablet (650 mg) by mouth daily        Alpha-D-Galactosidase (Beano) Tab Take by mouth as needed   0    Apoaequorin (Prevagen) 10 MG Cap Take 1 capsule (10 mg) by mouth daily        atorvastatin (LIPITOR) 10 MG tablet Take 1 tablet (10 mg) by mouth daily 90 tablet 3    D-Mannose 500 MG Cap Take 2 capsules (1,000 mg) by mouth daily        famotidine (PEPCID) 20 MG tablet Take 1 tablet (20 mg) by mouth daily        loratadine  (CLARITIN) 10 MG tablet Take 1 tablet (10 mg) by mouth as needed 30 tablet 11    losartan (COZAAR) 50 MG tablet Take 1.5 tablets (75 mg) by mouth daily 135 tablet 3    Melatonin 3 MG Cap Take 1 capsule (3 mg) by mouth nightly as needed (sleep aide)   0    Multiple Vitamin (multivitamin) capsule Take 1 capsule by mouth daily        mupirocin (BACTROBAN) 2 % ointment          NON FORMULARY Take 1 capsule by mouth daily CRANBERRY PACs        Phenazopyridine HCl (AZO TABS PO) Take by mouth as needed (for bladder infection)        Probiotic Product (PROBIOTIC PO) Take 1 capsule by mouth daily        risedronate (ACTONEL) 150 MG tablet TAKE ONE TABLET BY MOUTH EVERY 30 DAYS 12 tablet 0    sulfamethoxazole-trimethoprim (BACTRIM) 400-80 MG per tablet 1 tablet As needed for UTI        vitamin D (cholecalciferol) 25 MCG (1000 UT) tablet Take 1 tablet (25 mcg) by mouth daily             Allergies:    Allergies[3]        Allergen Reactions    Codeine Nausea Only       Other reaction(s): Unknown    Morphine Nausea Only       Other reaction(s): Unknown    Fructose Diarrhea    Sesame Oil      Soybean-Containing Drug Products               Past Surgical History:    Past Surgical History:   Procedure Laterality Date    BREAST IMPLANT Right     BUNIONECTOMY      COLON SURGERY  Began in 1974 ended 1980    Desmoid tumor    COLONOSCOPY, DIAGNOSTIC (SCREENING)  2018    COLONOSCOPY, DIAGNOSTIC (SCREENING) N/A 04/08/2022    Procedure: COLONOSCOPY;  Surgeon: Huntley Estelle, MD;  Location: [facility];  Service: Gastroenterology;  Laterality: N/A;    HYSTERECTOMY  1979    MASTECTOMY Right     1996  NEPHRECTOMY Left 1979    desmoid tumor    OOPHORECTOMY  1979    Possibly just the left. Radiation followed the next year on the right side.    SMALL INTESTINE SURGERY  Begin in 1974    During my surgery (7) for my desmoid tumor.    TONSILLECTOMY  1957           Family History:    Family History   Problem Relation Age of Onset    Thyroid  disease Mother     Macular degeneration Mother     Dementia Mother     Arthritis Mother     Clotting disorder Mother     Glaucoma Father     Macular degeneration Father     Hypertension Father         Treated w/medication. Later he didn't need meds.    Prostate cancer Father         Slow growing    Ovarian cancer Sister 51    Colon cancer Maternal Grandmother 32    Cancer Maternal Grandmother         Colon    Breast cancer Paternal Grandmother     Cancer Paternal Grandmother         Breast cancer    Valvular heart disease Son     Valve Surgery Son            Social History:    Social History[4]       Vital Signs:    Vitals:    01/20/23 1131   BP: 108/66   BP Site: Left arm   Patient Position: Sitting   Cuff Size: Medium   Pulse: 73   Resp: 16   Temp: 97.4 F (36.3 C)   TempSrc: Temporal   SpO2: 96%   Weight: 74.6 kg (164 lb 6.4 oz)   Height: 1.651 m (5\' 5" )      BP Readings from Last 3 Encounters:   01/20/23 108/66   01/08/23 147/84   11/13/22 133/80     Wt Readings from Last 3 Encounters:   01/20/23 74.6 kg (164 lb 6.4 oz)   01/08/23 74.4 kg (164 lb)   11/13/22 74.4 kg (164 lb)     Body mass index is 27.36 kg/m.        Physical Exam:    Physical Exam  Constitutional:       General: She is not in acute distress.     Appearance: She is not ill-appearing.   HENT:      Head: Normocephalic.      Mouth/Throat:      Mouth: Mucous membranes are moist.      Pharynx: Oropharynx is clear.   Eyes:      Extraocular Movements: Extraocular movements intact.      Conjunctiva/sclera: Conjunctivae normal.      Pupils: Pupils are equal, round, and reactive to light.   Cardiovascular:      Rate and Rhythm: Normal rate and regular rhythm.      Pulses: Normal pulses.      Heart sounds: Normal heart sounds.   Pulmonary:      Effort: Pulmonary effort is normal.      Breath sounds: Normal breath sounds.   Abdominal:      Palpations: Abdomen is soft.      Tenderness: There is no abdominal tenderness.   Musculoskeletal:      Cervical back:  Neck supple.      Right lower  leg: No edema.      Left lower leg: No edema.   Skin:     General: Skin is warm and dry.   Neurological:      General: No focal deficit present.      Mental Status: She is alert.              Assessment/Plan:      Aimee Wade was seen today for pre-op exam.    Diagnoses and all orders for this visit:    Pre-op evaluation  -     ECG 12 lead    Other orders  -     risedronate (ACTONEL) 150 MG tablet; Take 1 tablet (150 mg) by mouth every 30 (thirty) days      Preoperative Evaluation:  Surgery Specific Risk:  Low (endoscopic, superficial, breast, opthalmologic, minor orthopedic)  ASA Classification: Class 2 - Mild systemic disease, no functional limitations.    EKG Findings:   normal EKG, normal sinus rhythm, no acute ST-T changes HR 64, Qtc 451  Preoperative Status:  Exercise tolerance is greater than 4 mets.  Hypertension is controlled on the current regimen.  Recommendations:  ASA - Pt adivsed to stop ASA at least numbers: 7 days prior to surgery. NSAID's - Pt advised to stop NSAID at least numbers: 7 days prior to surgery.  Pt is at low risk for peri-operative cardiovascular complications during planned low risk surgical procedure.    Aimee Wade is here for preoperative evaluation.  This patient is in acceptable medical condition and can proceed to surgery without further risk stratification.    RCRI class I risk (no history of ischemic heart disease, congestive heart failure, TIA, stroke, insulin use, or creatinine >2, not elevated risk surgery)  METS 4          Follow-up:    Return in about 3 months (around 04/22/2023) for Medicare wellness, no need to fast.         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    Essential hypertension    Postmenopausal    Hyperlipidemia    Gastroesophageal reflux disease    Tubular adenoma of colon   [2]   Outpatient Medications Marked as Taking for the 01/20/23 encounter (Surgical Consult) with Iris Pert, MD   Medication Sig Dispense Refill    acetaminophen (Tylenol 8 Hour Arthritis Pain) 650 MG CR tablet Take 1 tablet (650 mg) by mouth daily      Alpha-D-Galactosidase (Beano) Tab Take by mouth as needed  0    Apoaequorin (Prevagen) 10 MG Cap Take 1 capsule (10 mg) by mouth daily      atorvastatin (LIPITOR) 10 MG tablet Take 1 tablet (10 mg) by mouth daily 90 tablet 3    D-Mannose 500 MG Cap Take 2 capsules (1,000 mg) by mouth daily      famotidine (PEPCID) 20 MG tablet Take 1  tablet (20 mg) by mouth daily      loratadine (CLARITIN) 10 MG tablet Take 1 tablet (10 mg) by mouth as needed 30 tablet 11    losartan (COZAAR) 50 MG tablet Take 1.5 tablets (75 mg) by mouth daily 135 tablet 3    Melatonin 3 MG Cap Take 1 capsule (3 mg) by mouth nightly as needed (sleep aide)  0    Multiple Vitamin (multivitamin) capsule Take 1 capsule by mouth daily      mupirocin (BACTROBAN) 2 % ointment       NON FORMULARY Take 1 capsule by mouth daily CRANBERRY PACs      Phenazopyridine HCl (AZO TABS PO) Take by mouth as needed (for bladder infection)      Probiotic Product (PROBIOTIC PO) Take 1 capsule by mouth daily      sulfamethoxazole-trimethoprim (BACTRIM) 400-80 MG per tablet 1 tablet As needed for UTI      vitamin D (cholecalciferol) 25 MCG (1000 UT) tablet Take 1 tablet (25 mcg) by mouth daily      [DISCONTINUED] risedronate (ACTONEL) 150 MG tablet TAKE ONE TABLET BY MOUTH EVERY 30 DAYS 12 tablet 0   [3]   Allergies  Allergen Reactions    Codeine Nausea Only     Other reaction(s): Unknown    Morphine Nausea Only     Other reaction(s): Unknown     Fructose Diarrhea    Sesame Oil     Soybean-Containing Drug Products    [4]   Social History  Tobacco Use    Smoking status: Never    Smokeless tobacco: Never   Vaping Use    Vaping status: Never Used   Substance Use Topics    Alcohol use: Not Currently    Drug use: Never

## 2023-01-20 NOTE — Progress Notes (Signed)
Health Maintenance Due   Topic Date Due    Advance Directive on File  Never done    HEPATITIS C SCREENING  Never done

## 2023-01-22 ENCOUNTER — Telehealth (INDEPENDENT_AMBULATORY_CARE_PROVIDER_SITE_OTHER): Payer: Self-pay

## 2023-01-22 NOTE — Telephone Encounter (Signed)
Pt calling for clarification on after visit summary from preop visit    Was not clear what ASA was in context of medications to stop before surgery--told her it was aspirin which she does not routinely take    Pt had additional questions about supplements she's taking:    Multivitamin  D-3supplement,  D-Montos something ?OTC urology advised her to take  Prevagen    Pt advised would double check with Dr Chipper Herb if she definitely needs to stop these prior to surgery    Pt verbalized understanding and agrees to plan of care.

## 2023-01-22 NOTE — Telephone Encounter (Signed)
Called pt back, informed per Dr Chipper Herb her supplements are fine with her surgery    Pt verbalized understanding and agrees to plan of care.

## 2023-01-26 ENCOUNTER — Ambulatory Visit (FREE_STANDING_LABORATORY_FACILITY): Payer: Medicare Other | Admitting: Nurse Practitioner

## 2023-01-26 ENCOUNTER — Encounter (INDEPENDENT_AMBULATORY_CARE_PROVIDER_SITE_OTHER): Payer: Self-pay | Admitting: Nurse Practitioner

## 2023-01-26 VITALS — BP 113/83 | HR 74 | Temp 98.0°F | Ht 65.51 in | Wt 167.0 lb

## 2023-01-26 DIAGNOSIS — R339 Retention of urine, unspecified: Secondary | ICD-10-CM

## 2023-01-26 DIAGNOSIS — N390 Urinary tract infection, site not specified: Secondary | ICD-10-CM

## 2023-01-26 DIAGNOSIS — Z905 Acquired absence of kidney: Secondary | ICD-10-CM

## 2023-01-26 DIAGNOSIS — N3946 Mixed incontinence: Secondary | ICD-10-CM

## 2023-01-26 LAB — COMPREHENSIVE METABOLIC PANEL
ALT: 22 U/L (ref 0–55)
AST (SGOT): 22 U/L (ref 5–41)
Albumin/Globulin Ratio: 1.4 (ref 0.9–2.2)
Albumin: 3.8 g/dL (ref 3.5–5.0)
Alkaline Phosphatase: 66 U/L (ref 37–117)
Anion Gap: 8 (ref 5.0–15.0)
BUN: 16 mg/dL (ref 7–21)
Bilirubin, Total: 0.3 mg/dL (ref 0.2–1.2)
CO2: 25 mEq/L (ref 17–29)
Calcium: 9.3 mg/dL (ref 7.9–10.2)
Chloride: 106 mEq/L (ref 99–111)
Creatinine: 1 mg/dL (ref 0.4–1.0)
GFR: 58.1 mL/min/{1.73_m2} — ABNORMAL LOW (ref >=60.0)
Globulin: 2.7 g/dL (ref 2.0–3.6)
Glucose: 111 mg/dL — ABNORMAL HIGH (ref 70–100)
Hemolysis Index: 12 Index
Potassium: 4.4 mEq/L (ref 3.5–5.3)
Protein, Total: 6.5 g/dL (ref 6.0–8.3)
Sodium: 139 mEq/L (ref 135–145)

## 2023-01-26 LAB — URINALYSIS POCT
Urine Bilirubin POCT: NEGATIVE
Urine Blood POCT: NEGATIVE
Urine Glucose POCT: NEGATIVE mg/dL
Urine Ketones POCT: NEGATIVE mg/dL
Urine Leukocyte Esterase POCT: NEGATIVE
Urine Nitrites POCT: NEGATIVE
Urine Protein POCT: NEGATIVE mg/dL
Urine Specific Gravity POCT: 1.01
Urine Urobilinogen POCT: 0.2 mg/dL (ref 0.2–2.0)
Urine pH POCT: 5.5 (ref 5.0–8.0)

## 2023-01-26 NOTE — Patient Instructions (Addendum)
Continue D-mannose, Cranberry, and Probiotic  Drink plenty of water up to 2 liters daily  Timed voiding: urinate every 2-3 hours during the day  Positional voiding (sit straight up at the end of voiding then lean forward to help empty your bladder)   Renal ultrasound was ordered.Call [facility] at [phone] to schedule radiology test(s).       To have your urine checked when new bothersome symptoms arise:  Our office accepts "Urgent Urine Visits" every week, Monday through Friday, between the hours of 8:30 am - 11:00 am. Please call ahead and we can accommodate you on the schedule of one of our Providers. Phone # [phone]

## 2023-01-26 NOTE — Progress Notes (Signed)
Subjective:      Patient ID: Aimee Wade is a 77 y.o. female     Chief Complaint:  1. Incomplete bladder emptying    2. H/O left nephrectomy    3. Acquired solitary kidney    4. Mixed urge and stress incontinence    5. Recurrent UTI        01/19/2022 Dr. 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  Estrace vaginal cream uses twice a month due to discomfort after, she reported that she has bothersome urinary symptoms when she uses the cream  Pelvic PT- she did not started, has problems with scheduling and insurance.She does kegels by herself and positional voiding and it helps with urinary incontinence.  RUS 01/29/2022: solitary right kidney, no hydronephrosis, PVR 81 cc  Today: no dysuria, no bladder pain, no pelvic pain, no urgency and frequency and no fever.  She is asymptomatic. She still has Bactrim Dr. Dimple Casey gave her just in case she develops symptoms.  Nocturia Q2 hours. Bedtime at 9:30 pm, last liquid at 8 pm water  She had colonoscopy last week- constipation on and off, last BM today  She snores     She reported that she uses scented and with lotion toilet paper for years        07/28/2022  She is here for 3 months follow up. No uti  since last visit.  Takes daily D-Mannose, Probiotic, and cranberry pills  Nocturia Q2 hours, bedtime at 9 -10 pm, last liquid 7:30 pm( water)  She did not have Sleep study   Today: no dysuria, no hematuria, but she Reported bladder fullness/ pain and fatigue  She still leaks urine with cough, sneeze-never scheduled pelvic PT  She wears diapers 3x daily  Sometimes constipated- BM daily( sometimes she has to strain)  She stop Estrace vaginal cream 2 months ago due to vaginal itching and burning         10/27/2022  Here for 3 months follow up.  Takes daily D-Mannose, Probiotic, and cranberry pills  She reported 3 UTI's since last visit.  Last UTI was few weeks ago, pt did not see provider and urine sample was not done. She double d-mannose and symptoms resolved.  Last week she had tooth infection and was started on Amoxicillin, she still takes abx. She denies UTI symptoms.     No constipation      TODAY 01/26/2023  Aimee Wade presents today for 6 months follow up. She had 2 UTI's since last visit. In May ucx shows E.coli and on 01/08/2023 ucx shows 1-9 K Gram negative rods. In July pt was started  on Macrobid and symptoms got worse, then pt self tx herself with Bactrim (which she had just in case of emergency) All symptoms resolved.    She takes D-mannose, Probiotics and Cranberry daily     Today pt is asymptomatic  Pt experiences  constipation sometimes    The following portions of the patient's history were reviewed and updated as appropriate: allergies, current medications, past family history, past medical history, past social history, past surgical history and problem list.    Review of Systems  As per HPI      Objective:   BP 113/83 (BP Site: Left arm, Patient Position: Sitting, Cuff Size: Medium)   Pulse 74   Temp 98 F (36.7 C) (Oral)   Ht 1.664 m (5' 5.51")   Wt 75.8 kg (167 lb)   BMI 27.36 kg/m   General appearance - alert, well appearing, and in no distress  Mental status - alert, oriented to person,  place, and time, appropriate affect  Skin: Normal temperature, turgor and texture; no rash or ulcers       PVR 28 ml    Lab Review                   Component  Ref Range & Units 09:44 2 wk ago 2 mo ago 3 mo ago 6 mo ago 9 mo ago 1 yr ago    Urine Color POCT  Light Yellow, Yellow, Dark yellow Yellow   Yellow R Yellow R Yellow R Dark yellow R    Urine Clarity POCT  Clear Clear   Clear R Cloudy Abnormal  R Cloudy Abnormal  R Cloudy Abnormal  R    Urine pH POCT  5.0 - 8.0 5.5   5.5 5.5 5.5 6.0    Urine Leukocyte Esterase POCT  Negative Negative   Trace Abnormal  Large Abnormal  Large Abnormal  Large Abnormal     Urine Nitrites POCT  Negative Negative   Negative Positive Abnormal  Positive Abnormal  Positive Abnormal     Urine Protein POCT  Negative mg/dL Negative          Urine Glucose POCT  Negative mg/dL Negative   Negative Negative Negative Negative    Urine Ketones POCT  Negative mg/dL Negative   Negative Negative Negative Negative    Urine Urobilinogen POCT  0.2 - 2.0 mg/dL 0.2          Urine Bilirubin POCT  Negative Negative   Negative Negative Negative Negative    Urine Blood POCT  Negative Negative 3+ (200 Ery/ul) Abnormal  Negative Negative Small Abnormal  Moderate Abnormal  Moderate Abnormal     Urine Specific Gravity POCT 1.010                Radiology Review   Results for orders placed during the hospital encounter of 01/29/22    US Renal Kidney Bladder Complete    Narrative  HISTORY: Right-sided flank pain. Solitary right kidney.    COMPARISON: None.    FINDINGS:  Renal Measurements (long axis):  Right kidney: 12.3 cm  Left kidney: Status post nephrectomy.    Right kidney: Parenchymal echogenicity and thickness are normal. No  hydronephrosis. No solid renal mass. No sonographic evidence of renal  calculi.    Incidentally noted is hepatic steatosis and cholelithiasis.    Bladder: The bladder is imaged at a volume of 86 cc. The wall thickness is  normal for the degree of distention. No mass, calculi, or  diverticula.  Post-void residual 81 cc after the second attempt. There is a right  ureteral jet.    Impression  1. 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 There is no hydronephrosis, discrete  solid mass or discernible calculus within the kidney.  2. Limited evaluation of the urinary bladder with no gross abnormality.  There is a post void residual of 81 cc after the second attempt.  3. Incidentally noted is hepatic steatosis and cholelithiasis.    Stephannie Peters, MD  01/29/2022 9:46 AM         Assessment:     1. Incomplete bladder emptying    2. H/O left nephrectomy    3. Acquired solitary kidney    4. Mixed urge and stress incontinence    5. Recurrent UTI      To help prevent future UTI's pt was advised to continue daily D-mannose, Cranberry, and Probiotics  CMP was ordered to check kidney fx  RUS was ordered for evaluation of GU tract     Management and care for this patient was reviewed with the supervising physician.        Plan:     Patient Instructions   Continue D-mannose, Cranberry, and Probiotic  Drink plenty of water up to 2 liters daily  Timed voiding: urinate every 2-3 hours during the day  Positional voiding (sit straight up at the end of voiding then lean forward to help empty your bladder)   Renal ultrasound was ordered.Call [facility] at [phone] to schedule radiology test(s).       To have your urine checked when new bothersome symptoms arise:  Our office accepts "Urgent Urine Visits" every week, Monday through Friday, between the hours of 8:30 am - 11:00 am. Please call ahead and we can accommodate you on the schedule of one of our Providers. Phone # [phone]         Orders  Orders Placed This Encounter   Procedures    US Renal Kidney Bladder Complete     Scheduling Instructions:      To schedule your procedure please call your chosen [facility] Scheduling Number:      [facility] Scheduling [phone]      [facility] Scheduling [phone]      Order Specific Question:   Clinical info for radiologist     Answer:   recurrent UTI, solitary kidney     Order Specific Question:   Release to patient     Answer:   Immediate     Order Specific Question:   Reason for Exam:     Answer:   recurrent uti, solitary kidney    Comprehensive Metabolic Panel     Standing Status:   Future     Standing Expiration Date:   01/26/2024     Order Specific Question:   Has the patient fasted?     Answer:   No     Order Specific Question:   Release to patient     Answer:   Immediate

## 2023-01-27 ENCOUNTER — Encounter (INDEPENDENT_AMBULATORY_CARE_PROVIDER_SITE_OTHER): Payer: Self-pay | Admitting: Internal Medicine

## 2023-01-27 DIAGNOSIS — R7301 Impaired fasting glucose: Secondary | ICD-10-CM | POA: Insufficient documentation

## 2023-02-03 ENCOUNTER — Ambulatory Visit: Payer: Medicare Other

## 2023-02-03 ENCOUNTER — Other Ambulatory Visit (INDEPENDENT_AMBULATORY_CARE_PROVIDER_SITE_OTHER): Payer: Self-pay | Admitting: Internal Medicine

## 2023-02-03 HISTORY — PX: CATARACT EXTRACTION BILATERAL W/ ANTERIOR VITRECTOMY: SHX1304

## 2023-02-05 ENCOUNTER — Ambulatory Visit
Admission: RE | Admit: 2023-02-05 | Discharge: 2023-02-05 | Disposition: A | Discharge status: Home / Self Care | Payer: Medicare Other | Source: Ambulatory Visit | Attending: Nurse Practitioner | Admitting: Nurse Practitioner

## 2023-02-05 DIAGNOSIS — Z905 Acquired absence of kidney: Secondary | ICD-10-CM | POA: Insufficient documentation

## 2023-02-05 DIAGNOSIS — N390 Urinary tract infection, site not specified: Secondary | ICD-10-CM | POA: Insufficient documentation

## 2023-02-08 ENCOUNTER — Ambulatory Visit: Payer: Medicare Other

## 2023-02-16 ENCOUNTER — Ambulatory Visit (INDEPENDENT_AMBULATORY_CARE_PROVIDER_SITE_OTHER): Payer: Medicare Other

## 2023-02-16 DIAGNOSIS — I10 Essential (primary) hypertension: Secondary | ICD-10-CM

## 2023-02-16 DIAGNOSIS — E785 Hyperlipidemia, unspecified: Secondary | ICD-10-CM

## 2023-02-17 ENCOUNTER — Telehealth (INDEPENDENT_AMBULATORY_CARE_PROVIDER_SITE_OTHER): Payer: Self-pay

## 2023-02-17 NOTE — Telephone Encounter (Signed)
Chartspan message:     FYI: On February 16, 2023, a Patient Care Coordinator spoke with patient Aimee Wade (DOB: March 25, 1946). The patient stated she stated she had outpatient cataract surgery in her right eye on 02/03/2023. She stated that she was home and recovering well. No further details were provided. We wanted to notify the provider of this call. Thank you. Glean Hess

## 2023-03-02 ENCOUNTER — Encounter (INDEPENDENT_AMBULATORY_CARE_PROVIDER_SITE_OTHER): Payer: Self-pay | Admitting: Internal Medicine

## 2023-03-02 ENCOUNTER — Encounter (INDEPENDENT_AMBULATORY_CARE_PROVIDER_SITE_OTHER): Payer: Self-pay

## 2023-03-02 ENCOUNTER — Ambulatory Visit (FREE_STANDING_LABORATORY_FACILITY): Payer: Medicare Other | Admitting: Internal Medicine

## 2023-03-02 VITALS — BP 140/84 | HR 72 | Temp 97.2°F | Resp 16 | Ht 65.51 in | Wt 166.0 lb

## 2023-03-02 DIAGNOSIS — E785 Hyperlipidemia, unspecified: Secondary | ICD-10-CM

## 2023-03-02 DIAGNOSIS — I10 Essential (primary) hypertension: Secondary | ICD-10-CM

## 2023-03-02 DIAGNOSIS — E538 Deficiency of other specified B group vitamins: Secondary | ICD-10-CM

## 2023-03-02 DIAGNOSIS — Z1159 Encounter for screening for other viral diseases: Secondary | ICD-10-CM

## 2023-03-02 LAB — LAB USE ONLY - CBC WITH DIFFERENTIAL
Absolute Basophils: 0.03 10*3/uL (ref 0.00–0.08)
Absolute Eosinophils: 0.14 10*3/uL (ref 0.00–0.44)
Absolute Immature Granulocytes: 0.02 10*3/uL (ref 0.00–0.07)
Absolute Lymphocytes: 1.32 10*3/uL (ref 0.42–3.22)
Absolute Monocytes: 0.38 10*3/uL (ref 0.21–0.85)
Absolute Neutrophils: 3.6 10*3/uL (ref 1.10–6.33)
Absolute nRBC: 0 10*3/uL (ref <=0.00)
Basophils %: 0.5 %
Eosinophils %: 2.6 %
Hematocrit: 42.5 % (ref 34.7–43.7)
Hemoglobin: 13.8 g/dL (ref 11.4–14.8)
Immature Granulocytes %: 0.4 %
Lymphocytes %: 24 %
MCH: 29.3 pg (ref 25.1–33.5)
MCHC: 32.5 g/dL (ref 31.5–35.8)
MCV: 90.2 fL (ref 78.0–96.0)
MPV: 10.7 fL (ref 8.9–12.5)
Monocytes %: 6.9 %
Neutrophils %: 65.6 %
Platelet Count: 248 10*3/uL (ref 142–346)
Preliminary Absolute Neutrophil Count: 3.6 10*3/uL (ref 1.10–6.33)
RBC: 4.71 10*6/uL (ref 3.90–5.10)
RDW: 13 % (ref 11–15)
WBC: 5.49 10*3/uL (ref 3.10–9.50)
nRBC %: 0 /100{WBCs} (ref <=0.0)

## 2023-03-02 LAB — BASIC METABOLIC PANEL
Anion Gap: 7 (ref 5.0–15.0)
BUN: 15 mg/dL (ref 7–21)
CO2: 24 meq/L (ref 17–29)
Calcium: 9.4 mg/dL (ref 7.9–10.2)
Chloride: 109 meq/L (ref 99–111)
Creatinine: 0.9 mg/dL (ref 0.4–1.0)
GFR: >60 mL/min/{1.73_m2} (ref >=60.0)
Glucose: 98 mg/dL (ref 70–100)
Hemolysis Index: 20 {index}
Potassium: 4.8 meq/L (ref 3.5–5.3)
Sodium: 140 meq/L (ref 135–145)

## 2023-03-02 LAB — LIPID PANEL
Cholesterol / HDL Ratio: 1.8 {index}
Cholesterol: 129 mg/dL (ref <=199)
HDL: 73 mg/dL (ref >=40)
LDL Calculated: 41 mg/dL (ref 0–99)
Triglycerides: 77 mg/dL (ref 34–149)
VLDL Calculated: 15 mg/dL (ref 10–40)

## 2023-03-02 LAB — LAB USE ONLY - HEPATITIS C ANTIBODY, TOTAL: Hepatitis C Antibody: NONREACTIVE

## 2023-03-02 LAB — VITAMIN B12: Vitamin B-12: 214 pg/mL (ref 211–911)

## 2023-03-02 LAB — LAB USE ONLY - GOLD SST HOLD TUBE

## 2023-03-02 NOTE — Progress Notes (Signed)
 Health Maintenance Due   Topic Date Due    Advance Directive on File  Never done    HEPATITIS C SCREENING  Never done    INFLUENZA VACCINE  01/28/2023

## 2023-03-02 NOTE — Progress Notes (Deleted)
[facility] - AN [place] [facility]    Aimee Wade is a 77 y.o. female who presents today for the following Medicare Wellness Visit:  []  Initial Preventive Physical Exam (IPPE) - "Welcome to Medicare" preventive visit (Vision Screening required)   []  Annual Wellness Visit - Initial  [x]  Annual Wellness Visit - Subsequent  {Vanishing Tip Click a link below to be taken to that activity or part of the chart   Chart Review  Order Review  Review Flowsheets  Labs  Health Maintenance  Immunizations  Allergies  Medications  Problem List  History :55325}     Health Risk Assessment:   During the past month, how would you rate your general health?:  (P) Good  Which of the following tasks can you do without assistance - drive or take the bus alone; shop for groceries or clothes; prepare your own meals; do your own housework/laundry; handle your own finances/pay bills; eat, bathe or get around your home?: (P) Drive or take the bus alone, Shop for groceries or clothes, Prepare your own meals, Do your own housework/laundry, Handle your own finances/pay bills, Eat, bathe, dress or get around your home  Which of the following problems have you been bothered by in the past month - dizzy when standing up; problems using the phone; feeling tired or fatigued; moderate or severe body pain?: (P) Dizzy when standing up, Feeling tired or fatigued  Do you exercise for about 20 minutes 3 or more days per week?:(P) No  During the past month was someone available to help if you needed and wanted help?  For example, if you felt nervous, lonely, got sick and had to stay in bed, needed someone to talk to, needed help with daily chores or needed help just taking care of yourself.: (P) Yes  Do you always wear a seat belt?: (P) Yes  Do you have any trouble taking medications the way you have been told to take them?: (P) No  Have you been given any information that can help you with keeping track of your medications?:  (P) No  Do you have trouble paying for your medications?: (P) No  Have you been given any information that can help you with hazards in your house, such as scatter rugs, furniture, etc?: (P) No  Do you feel unsteady when standing or walking?: (P) Yes  Do you worry about falling?: (P) Yes  Have you fallen two or more times in the past year?: (P) Yes  Did you suffer any injuries from your falls in the past year?: (P) No     Care Team:   Patient Care Team:  Iris Pert, MD as PCP - General (Internal Medicine)  Tresa Res, MD as Consulting Physician (Urology)  Vernie Murders, MD as Consulting Physician (Dermatology)      Hospitalizations:   Hospitalization within past year: [x]  No  []  Yes     Diagnosis:      Screenings:       07/28/2022 01/26/2023 02/25/2023   Ambulatory Screenings   Falls Risk: [place] more than 2 times in past year   Y   Falls Risk: Suffer any injuries?   N   Depression: PHQ2 Total Score 0 0    Depression: PHQ9 Total Score 0 0            Substance Use Disorder Screen:  In the past year, how often have you used the following?  1) Alcohol (For men, 5 or more  drinks a day. For women, 4 or more drinks a day)  [x]  Never []  Once or Twice []  Monthly []  Weekly []  Daily or Almost Daily  2) Tobacco Products  [x]  Never []  Once or Twice []  Monthly []  Weekly []  Daily or Almost Daily  3) Prescription Drugs for Non-Medical Reasons  [x]  Never []  Once or Twice []  Monthly []  Weekly []  Daily or Almost Daily  4) Illegal Drugs  [x]  Never []  Once or Twice []  Monthly []  Weekly []  Daily or Almost Daily     {Alcohol Risk Questionnaire (Optional):52817}  {Opioid Misuse Screen - Addiction Behavior Checklist (Optional):52815}  {DAST10 (Optional):52816}    Functional Ability/Level of Safety:   Falls Risk/Home Safety Assessment:  ( see HRA and Screenings sections for additional assessment)  Home Safety: [x]  Stair handrails  [x]  Skid-resistant rugs/remove throw rugs   [x]  Grab bars  [x]  Clear pathways between rooms  [x]  Proper  lighting stairs/ bathrooms/bedrooms  Get Up and Go (optional):  [x]   <20 secs  []   >20 secs    []   High risk for falls - Home Safety/Falls Risk Precautions reviewed with pt/family    Hearing Assessment:  Concerns for hearing loss: []  Yes  [x]   No  Hearing aids:   []   Right  []   Left  []   Bilateral   []   None  Whisper Test (optional):  []  Normal  []   Slightly decreased  []   Significantly decreased    Exercise:  Frequency:  [x]   No formal exercise  []   1-2x/wk  []   3-4x/wk  []   >4x/wk  Duration:  []   15-30 mins/day  []   30-45 mins/day  []   45+ mins/day  Intensity:  []   Light  []   Moderate  []   Heavy        Activities of Daily Living:   ADL's Independent Minimal  Assistance Moderate  Assistance Total   Assistance   Bathing [x]  []  []  []    Dressing [x]  []  []  []    Mobility   [x]  []  []  []    Transfer [x]  []  []  []    Eating [x]  []  []  []    Toileting [x]  []  []  []      IADL's Independent Minimal  Assistance Moderate  Assistance Total   Assistance   Phone [x]  []  []  []    Housekeeping [x]  []  []  []    Laundry [x]  []  []  []    Transportation [x]  []  []  []    Medications [x]  []  []  []    Finances [x]  []  []  []       ADL assistance: [x]  No assistance needed  []  Spouse  []  Sibling  []  Son   []  Daughter []  Children  []  Home Health Aide []  Other:       Advance Care Planning:   Discussion of Advance Directives:   []  Advance Directive in chart  []  Advance Directive not in chart - requested to provide []  No Advance Directive.  Form Provided  []  No Advance Directive.  Pt declines. []  Not addressed today  []  Other:     Exam:   There were no vitals taken for this visit.     Physical Exam     {Vision Screen(IPPE)/EKG Findings (IPPE)/ Additional Exam - Text (Optional):52818}     Evaluation of Cognitive Function:   Mood/affect: [x]  Appropriate  []   Other:   Appearance: [x]  Neatly groomed  [x]  Adequately nourished  []  Other:  Family member/caregiver input: []  Present - no concerns  []   Not present in room  []   Present - concerns:    Cognitive  Assessment:  Mini-Cog Result (three word registration- banana, sunrise, chair / clock drawing):   []   > 3 points - negative screen for dementia   []  3 recalled words - negative screen for dementia   [x]  1-2 recalled words and normal clock draw - negative for cognitive impairment   []  1-2 recalled words and abnormal clock draw - positive for cognitive impairment   []  0 recalled words - positive for cognitive impairment    {Additional Cognitive tests - SLUMS, MOCA, MMSE (Optional):52819}     Assessment/Plan:   There are no diagnoses linked to this encounter.    {Additional Education and Counseling Documentation (Optional):52822}     Nanine Means, [place]    03/02/2023     The following sections were reviewed this encounter by the provider:         History:   Problem List[1]   Medical History[2]  Past Surgical History:   Procedure Laterality Date    BREAST IMPLANT Right     BUNIONECTOMY      COLON SURGERY  Began in 1974 ended 1980    Desmoid tumor    COLONOSCOPY, DIAGNOSTIC (SCREENING)  2018    COLONOSCOPY, DIAGNOSTIC (SCREENING) N/A 04/08/2022    Procedure: COLONOSCOPY;  Surgeon: Huntley Estelle, MD;  Location: [facility];  Service: Gastroenterology;  Laterality: N/A;    HYSTERECTOMY  1979    MASTECTOMY Right     1996    NEPHRECTOMY Left 1979    desmoid tumor    OOPHORECTOMY  1979    Possibly just the left. Radiation followed the next year on the right side.    SMALL INTESTINE SURGERY  Begin in 1974    During my surgery (7) for my desmoid tumor.    TONSILLECTOMY  1957     Allergies[3]   Medications Taking[4]  Social History[5]   Family History   Problem Relation Age of Onset    Thyroid disease Mother     Macular degeneration Mother     Dementia Mother     Arthritis Mother     Clotting disorder Mother     Glaucoma Father     Macular degeneration Father     Hypertension Father         Treated w/medication. Later he didn't need meds.    Prostate cancer Father         Slow growing    Ovarian cancer Sister 36     Colon cancer Maternal Grandmother 80    Cancer Maternal Grandmother         Colon    Breast cancer Paternal Grandmother     Cancer Paternal Grandmother         Breast cancer    Valvular heart disease Son     Valve Surgery Son            ===================================================================    Additional Documentation:     {Modifier25 Separate Note Documentation (Optional):52820}       { Optional Reference (will not populate note after signing)  Modifier 25 is defined as a significant, separately identifiable, medically necessary Evaluation and Management (E/M) service by the same provider on the same day of the other service or procedure.  That portion of the visit must be medically necessary and reasonable to treat the beneficiary illness or injury.  E&M documentation must support that there has been a significant amount of additional work above and beyond what the provider  would normally provide, and the visit can stand alone as a medically necessary billable service.     If the provider renders an E/M service (for example [phone]) in addition to a preventive service (G0402, [phone] or [phone]),  - Provider should inform member of their potential responsibility to pay for the deductible/copay for the E/M portion of the service   - Submit the CPT code with modifier -25 along with the "G" code as part of the claims encounter submission (for example [phone] and link 29528 to modifier 25) :39607}           [1]   Patient Active Problem List  Diagnosis    History of right breast cancer    History of melanoma    History of squamous cell carcinoma of skin    Osteoporosis without current pathological fracture, unspecified osteoporosis type    Desmoid tumor of abdomen    History of left nephrectomy    Recurrent UTI    Cholelithiasis    Hepatic steatosis    Aortic atherosclerosis    Right middle lobe pulmonary nodule    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    At age 59 had my first  colonoscopy where the doctor found polyps which he removed. Have had colonoscopies  every 5 years.    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    Hypertension, unspecified type 05/06/2022    Inflammatory bowel disease     Lymphocytic colitis    Lymphedema     Left leg ; wears thigh high compression stockings    Lymphocytic colitis     Lymphocytic colitis 03/28/2021    Melanocytic nevi of trunk 03/26/2022    Nocturia 06/19/2021    Osteoporosis     Pelvic pain in female 01/19/2022    Peripheral vascular disease 1975    Surgery in left groin.    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 However when i moved up here and changed doctors the antibiotic  prescription was allowed to run out and now my uti's are back.    White coat syndrome with hypertension    [3]   Allergies  Allergen Reactions    Codeine Nausea Only  Other reaction(s): Unknown    Morphine Nausea Only     Other reaction(s): Unknown    Fructose Diarrhea    Sesame Oil     Soybean-Containing Drug Products    [4]   No  outpatient medications have been marked as taking for the 03/02/23 encounter (Office Visit) with Iris Pert, MD.   [5]   Social History  Tobacco Use    Smoking status: Never    Smokeless tobacco: Never   Vaping Use    Vaping status: Never Used   Substance Use Topics    Alcohol use: Not Currently    Drug use: Never

## 2023-03-02 NOTE — Progress Notes (Signed)
Subjective:      Patient ID: Aimee Wade is a 77 y.o. female     Chief Complaint:  Chief Complaint   Patient presents with    Fatigue     Pt wondering if B12 supplement is needed for increased fatigue    Follow-up     HTN, fasting labs     HPI:  03/02/23:  Reports home blood pressures 120s systolic.  Report some fatigue, thinks age related however requesting vitamin B12 level checked.    10/07/22:  Overall feeling well.  Here for follow-up of hypertension.  Checks home blood pressure every 1 to 2 weeks or if she has a headache.  Reports home blood pressure systolic 120s diastolic 70s to 80s.  Reports diet and exercise could be better.  Walks her 2 dogs twice a day.  Wears compression stockings.  Follows urology for recurrent UTIs and doing pelvic floor physical therapy.  Was recently in [place] for a week for her niece's wedding.    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    Essential hypertension    Postmenopausal    Hyperlipidemia    Gastroesophageal reflux disease    Tubular adenoma of colon    Impaired fasting glucose    Vitamin B12 deficiency     Current Medications:  Current Outpatient Medications on File Prior to Visit   Medication Sig Dispense Refill    acetaminophen (Tylenol 8 Hour Arthritis Pain) 650 MG CR tablet Take 1 tablet (650 mg) by mouth daily      Alpha-D-Galactosidase (Beano) Tab Take by mouth as needed  0    Apoaequorin (Prevagen) 10 MG Cap Take 1 capsule (10 mg) by mouth daily      atorvastatin (LIPITOR) 10 MG tablet Take 1 tablet (10 mg) by mouth daily 90  tablet 3    D-Mannose 500 MG Cap Take 2 capsules (1,000 mg) by mouth daily      famotidine (PEPCID) 20 MG tablet Take 1 tablet (20 mg) by mouth daily      loratadine (CLARITIN) 10 MG tablet Take 1 tablet (10 mg) by mouth as needed 30 tablet 11    losartan (COZAAR) 50 MG tablet Take 1.5 tablets (75 mg) by mouth daily 135 tablet 3    Melatonin 3 MG Cap Take 1 capsule (3 mg) by mouth nightly as needed (sleep aide)  0    Multiple Vitamin (multivitamin) capsule Take 1 capsule by mouth daily      NON FORMULARY Take 1 capsule by mouth daily CRANBERRY PACs      Phenazopyridine HCl (AZO TABS PO) Take by mouth as needed (for bladder infection)      Probiotic Product (PROBIOTIC PO) Take 1 capsule by mouth daily      risedronate (ACTONEL) 150 MG tablet Take 1 tablet (150 mg) by mouth every 30 (thirty) days 12 tablet 0    vitamin D (cholecalciferol) 25 MCG (1000 UT) tablet  Take 1 tablet (25 mcg) by mouth daily       No current facility-administered medications on file prior to visit.     Allergies:  Allergies   Allergen Reactions    Codeine Nausea Only     Other reaction(s): Unknown    Morphine Nausea Only     Other reaction(s): Unknown    Fructose Diarrhea    Sesame Oil     Soybean-Containing Drug Products      Vitals:  Vitals:    03/02/23 1032 03/02/23 1040   BP: 140/80 140/84   BP Site: Left arm Left arm   Patient Position: Sitting Sitting   Cuff Size: Medium Medium   Pulse: 72    Resp: 16    Temp: 97.2 F (36.2 C)    TempSrc: Temporal    SpO2: 94%    Weight: 75.3 kg (166 lb)    Height: 1.664 m (5' 5.51")      BP Readings from Last 3 Encounters:   03/02/23 140/84   01/26/23 113/83   01/20/23 108/66     Wt Readings from Last 3 Encounters:   03/02/23 75.3 kg (166 lb)   01/26/23 75.8 kg (167 lb)   01/20/23 74.6 kg (164 lb 6.4 oz)     Body mass index is 27.2 kg/m.     Objective:     Physical Exam:  Physical Exam  Constitutional:       General: She is not in acute distress.     Appearance: She is not ill-appearing.   HENT:       Head: Normocephalic.   Cardiovascular:      Rate and Rhythm: Normal rate and regular rhythm.      Heart sounds: Normal heart sounds.   Pulmonary:      Effort: Pulmonary effort is normal.      Breath sounds: Normal breath sounds.   Neurological:      Mental Status: She is alert.   Psychiatric:         Behavior: Behavior normal.       Assessment:     Solie was seen today for fatigue and follow-up.    Diagnoses and all orders for this visit:    Essential hypertension  -     Basic Metabolic Panel; Future  -     Basic Metabolic Panel    Hyperlipidemia, unspecified hyperlipidemia type  -     Lipid Panel; Future  -     Lipid Panel    Vitamin B12 deficiency  -     CBC with Differential (Order); Future  -     Vitamin B12; Future  -     CBC with Differential (Order)  -     Vitamin B12  -     Basic Metabolic Panel    Need for hepatitis C screening test  -     Hepatitis C Antibody, Total (Order); Future  -     Hepatitis C Antibody, Total (Order)      Plan:     Hypertension: Recommend more plant based, low salt diet.  Recommend checking home blood pressures.  On losartan 75 mg daily, may uptitrate in the future if blood pressure remains elevated.  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  On atorvastatin 10 mg daily  Vitamin B12 deficiency: Noted on labs.  Recommend over the counter supplement  Osteoporosis: Recommend dietary calcium intake, vitamin D, weight bearing exercises, minimize alcohol.  On risedronate 150 mg monthly.  Consider DEXA every 2 years.  May follow up with endocrinology in the future    Return in about 6 months (around 08/30/2023) for Medicare wellness, no need to fast.    Concha Norway, MD

## 2023-03-04 ENCOUNTER — Encounter (INDEPENDENT_AMBULATORY_CARE_PROVIDER_SITE_OTHER): Payer: Self-pay | Admitting: Internal Medicine

## 2023-03-04 DIAGNOSIS — E538 Deficiency of other specified B group vitamins: Secondary | ICD-10-CM | POA: Insufficient documentation

## 2023-03-15 ENCOUNTER — Ambulatory Visit
Admission: RE | Admit: 2023-03-15 | Discharge: 2023-03-15 | Disposition: A | Discharge status: Home / Self Care | Payer: Medicare Other | Source: Ambulatory Visit | Attending: Nurse Practitioner | Admitting: Nurse Practitioner

## 2023-03-15 DIAGNOSIS — N393 Stress incontinence (female) (male): Secondary | ICD-10-CM | POA: Insufficient documentation

## 2023-03-15 DIAGNOSIS — R102 Pelvic and perineal pain: Secondary | ICD-10-CM | POA: Insufficient documentation

## 2023-03-15 NOTE — PT Progress Note (Addendum)
[facility] Physical Therapy  [address]  [address] Floor  [place], [place] [address]  [phone]  [phone]    DAILY TREATMENT NOTE    Patient:  JIOVANNA DROLLINGER       MRN#:  [id number]  Referring MD:  [name], NP                                                                                     Certification Period: 01/13/2023 - 04/15/2023                                              Insurance Authorization: required for follow ups      Medical Dx: N 39.3 Stress Incontinence   Therapy Dx: decreased bladder control, decreased bowel control, pelvic pain    Date of Service: 03/15/2023  Time of Service: 7169-6789  Treatment number: 3/4      TIMED TREATMENT CODES min UNTIMED TREATMENT CODES units   Manual therapy   PT Evaluation    Therapeutic Exercise 44 Biofeedback    Neuromuscular Re-Ed  Hot/Cold Packs    Therapeutic Activity  E-Stim (unattended)      Assessment: 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 Continued instruction in PFM and breath training as she tends to hold breath.     Plan:  Frequency of treatment: Once a week for 4 weeks  Overall plan for PT:    Pelvic floor muscle training and strengthening, enhanced with cuing, RUSI, biofeedback and electrical stimulation as indicated     Develop personalized HEP for pelvic floor muscle strengthening    HEP of stretching exercises  Manual therapy including intravaginal and external associated muscle groups as indicated  Instruction in behavior modification for urinary urgency    Instruction in pelvic prolapse/pelvic floor protection  Functional constipation tips and  techniques  Next visit:    Cont PFM training  Review HEP  Review bladder training, urge suppression  Breath training  Goals:   Short Term Therapy Goals: x 4 visits    Demonstrate correct technique of PFM contraction exercise for proper strengthening of pelvic floor muscles in order  to reduce bowel and bladder incontinence.  2. Decrease urinary incontinence from currently weekly to couple times per month.  3. Decrease frequency of bowel incontinence to 1-2x/month.  4. Pt to report greater ease with passing stool with only min to no straining required majority of the time, per pt self-reporting -partially met  5. Pt to verbalize understanding of correct body mechanics for ADLs to allow pt to be as active as possible while decreasing urine leakage with ADLs  6. Min to no pelvic floor muscle tightness and/or tenderness on palpation to indicate decreasing muscle spasm responsible for causing pt's c/o pelvic pain  Long Term Therapy Goal(s)  x 4 visits    1. Independent with HEP and symptom management    Precautions:   standard  Falls Risk:  no  Any pertinent medications?:  womens probiotic, metamucil capsule   Change in medications?:   no   Change in condition/hospitalization?:  no    Subjective: Pt had cataract surgery x 2 since last Rx. Reports she has not been able to do much of the exercises since then.   Getting up twice a night to urinate. Urinating every hour and a half during the day. Remains with mixed UI.   Reports no recent bowel leakage, feels stool is better formed. Has been more aware of what she is eating. Doesn't have squatty potty.     Pain scale and location (0-10/10):       N/A         Intervention specifically for pain:    N/A       Patient is appropriate for Physical Therapy intervention at this time.  Pt is agreeable to participation in the therapy session.   Risks and benefits reviewed with pt. Verbal consent obtained.     Objective/Treatment Activities:   Reviewed education in behavior modification for urinary urgency including bladder training, urge suppression techniques.     RUSI enhanced pelvic floor muscle training utilizing curvilinear sound head with transabdominal imaging via transverse views: Via observation of bladder base inferred quality of PFM contraction  technique.  Provided verbal instruction and verbal cuing for correct technique.  2 sec work/4 sec rest, 1 sets of 10 reps, remains with difficulty with breath holding. Verbal and tactile cuing provided for breath work. Best able to activate PFM with 5 quick squeezes used in urge suppression with quick exhale/inhale.     Continues to need frequent cues to maintain breathing, tends to hold breath with isometrics and with turning sides in bed:   Access Code: [email or web address]  URL: [email or web address]  Date: 03/15/2023  Prepared by: Media Pizzini     Exercises  - Supine Diaphragmatic Breathing  - 2 x daily - 7 x weekly - 1 sets - 10 reps  - Quick Flick Pelvic Floor Contractions in Hooklying  - 3-5 x daily - 7 x weekly - 1-2 sets - 10 reps - 2 sec hold - 4 rest   - Seated Pelvic Floor Contraction  - 3-5 x daily - 7 x weekly - 1 sets - 10 reps - 2 sec hold  - Supine Bridge  - 1 x daily - 7 x weekly - 1-3 sets - 10 reps  - Clamshell  - 1 x daily - 7 x weekly - 1-3 sets - 10 reps  - Supine Hip Adduction Isometric with Ball  - 1 x daily - 7 x weekly - 1-3 sets - 10 reps - 5 sec hold      Pt Education:     HEP of PFM exercises as above  Education in behavior modification for urinary urgency  Educated the patient on plan of care, goals of therapy and progression of exercises and/or activities as appropriate.    Freada Bergeron, DPT  Department of Rehabilitation  [facility]

## 2023-03-17 ENCOUNTER — Ambulatory Visit (INDEPENDENT_AMBULATORY_CARE_PROVIDER_SITE_OTHER): Payer: Medicare Other

## 2023-03-17 DIAGNOSIS — I10 Essential (primary) hypertension: Secondary | ICD-10-CM

## 2023-03-17 DIAGNOSIS — E785 Hyperlipidemia, unspecified: Secondary | ICD-10-CM

## 2023-03-18 ENCOUNTER — Telehealth (INDEPENDENT_AMBULATORY_CARE_PROVIDER_SITE_OTHER): Payer: Self-pay

## 2023-03-18 NOTE — Telephone Encounter (Signed)
Chartspan message:     Nurse: On 03/17/23, a Patient Care Coordinator spoke with patient Aimee Wade (DOB: January 25, 1946). The patient stated that she had a second cataract surgery done on August 27th with Dr. Melton Krebs at [facility]. She stated that her first surgery in her right eye was performed on August 7th, and the most recent surgery in her left eye was on August 27th. She stated that everything was going well. The patient has an upcoming appointment with her eye specialist on 8/26. We wanted to notify the provider of this call. Thank you. Glean Hess

## 2023-03-29 ENCOUNTER — Ambulatory Visit: Payer: Medicare Other

## 2023-03-31 ENCOUNTER — Other Ambulatory Visit (INDEPENDENT_AMBULATORY_CARE_PROVIDER_SITE_OTHER): Payer: Self-pay | Admitting: Internal Medicine

## 2023-04-05 ENCOUNTER — Encounter (INDEPENDENT_AMBULATORY_CARE_PROVIDER_SITE_OTHER): Payer: Self-pay | Admitting: Internal Medicine

## 2023-04-05 ENCOUNTER — Ambulatory Visit
Admission: RE | Admit: 2023-04-05 | Discharge: 2023-04-05 | Disposition: A | Discharge status: Home / Self Care | Payer: Medicare Other | Source: Ambulatory Visit | Attending: Nurse Practitioner | Admitting: Nurse Practitioner

## 2023-04-05 DIAGNOSIS — R102 Pelvic and perineal pain: Secondary | ICD-10-CM | POA: Insufficient documentation

## 2023-04-05 DIAGNOSIS — N393 Stress incontinence (female) (male): Secondary | ICD-10-CM | POA: Insufficient documentation

## 2023-04-05 NOTE — PT Progress Note (Signed)
[facility] Outpatient Physical Therapy  [address]  [place], [address]  [place], [place] [address]  [phone]  [phone]    Discharge/DAILY TREATMENT NOTE    Patient:  Aimee Wade       MRN#:  [id number]  Referring MD:  [name], NP                                                                                     Certification Period: 01/13/2023 - 04/15/2023                                              Insurance Authorization: required for follow ups      Medical Dx: N 39.3 Stress Incontinence   Therapy Dx: decreased bladder control, decreased bowel control, pelvic pain    Date of Service: 04/05/2023  Time of Service: 1007-1052  Treatment number: Oct 06, 2022      TIMED TREATMENT CODES min UNTIMED TREATMENT CODES units   Manual therapy   PT Evaluation    Therapeutic Exercise 45  Biofeedback    Neuromuscular Re-Ed  Hot/Cold Packs    Therapeutic Activity  E-Stim (unattended)      Assessment: Pt has made some improvement in urinary frequency and nocturia but remains with mixed UI. Pt reports she has not been able to consistently perform PFM exercises. At this time pt will plan to perform HEP with increased consistency and DCPT.       Plan: DCPT at this time  Goals:   Short Term Therapy Goals: x 4 visits    Demonstrate correct technique of PFM contraction exercise for proper strengthening of pelvic floor muscles in order to reduce bowel and bladder incontinence.- partially met  2. Decrease urinary incontinence from currently weekly to couple times per month.- not met  3. Decrease frequency of bowel incontinence to 1-2x/month.- partiall met  4. Pt to report greater ease with passing stool with only min to no straining required majority of the time, per pt self-reporting -partially met  5. Pt to verbalize understanding of correct body mechanics for ADLs to allow pt to be as active as possible while decreasing urine leakage with ADLs- partially met  6. Min to no pelvic floor muscle tightness  and/or tenderness on palpation to indicate decreasing muscle spasm responsible for causing pt's c/o pelvic pain- partially met  Long Term Therapy Goal(s)  x 4 visits    1. Independent with HEP and symptom management- partially met    Precautions:   standard  Falls Risk:  no  Any pertinent medications?:  women's probiotic, metamucil   Change in medications?:   no   Change in condition/hospitalization?:  no    Subjective: Pt reports she hasn't had any recent UTIs. Only getting up once per night. Still having mixed UI daily. Pt reports she has not been consistent with exercises.     Pain scale and location (0-10/10):       N/A         Intervention specifically for pain:  N/A       Patient is appropriate for Physical Therapy intervention at this time.  Pt is agreeable to participation in the therapy session.   Risks and benefits reviewed with pt. Verbal consent obtained.     Objective/Treatment Activities:   Reviewed bladder habits:  Total voids in 24 hr period: about 7  Nocturia episodes: 1  Bladder leaks total:   mixed UI  Pads/protection per 24 hr period: at least 2 per day    Reviewed bowel habits:   Frequency of BMs: daily   Amount of straining for stools: min to moderate  Type stools on Bristol Stool Scale: type 2 to 4, type 6 maybe once per week/depending on what she's eating/going out to restaurants  Bowel accidents: maybe 1 per week    RUSI enhanced pelvic floor muscle training utilizing curvilinear sound head with transabdominal imaging via transverse views: Via observation of bladder base inferred quality of PFM contraction technique.  Provided verbal instruction and verbal cuing for correct technique.  2 sec work/4 sec rest, 2 sets of 10 reps  5 quick contracts - for urge suppression     Access Code: [email or web address]  URL: [email or web address]  Date: 010/12/2022  Prepared by: Pricsilla Lindvall     Exercises  - Supine Diaphragmatic Breathing  - 2 x daily - 7 x weekly - 1 sets - 10 reps  - Quick  Flick Pelvic Floor Contractions in Hooklying  - 3-5 x daily - 7 x weekly - 1-2 sets - 10 reps - 2 sec hold - 4 rest   - Seated Pelvic Floor Contraction  - 3-5 x daily - 7 x weekly - 1 sets - 10 reps - 2 sec hold  - Supine Bridge  - 1 x daily - 7 x weekly - 1-3 sets - 10 reps  - Clamshell  - 1 x daily - 7 x weekly - 1-3 sets - 10 reps  - Supine Hip Adduction Isometric with Ball  - 1 x daily - 7 x weekly - 1-3 sets - 10 reps - 5 sec hold    Outcome measure: Pelvic Floor Disability Index (PFDI-20)  Pelvic Organ Prolapse Distress Inventory (POPDI-6):   Score:   4 (4 on eval)   (0=Best, 100=Worst)   Colorectal-Anal Distress Inventory 8 (CRAD-8):  Score:  46 (50 on eval)     (0=Best, 100=Worst)               Urinary Distress Inventory 6 (UDI-6):  Score:     41  (45 on eval)  (0=Best, 100=Worst)     Pt Education:     HEP of PFM exercises as above  Educated the patient on plan of care, goals of therapy and progression of exercises and/or activities as appropriate.    Freada Bergeron, DPT  Department of Rehabilitation  [facility]

## 2023-04-15 ENCOUNTER — Ambulatory Visit (INDEPENDENT_AMBULATORY_CARE_PROVIDER_SITE_OTHER): Payer: Medicare Other

## 2023-04-15 DIAGNOSIS — I10 Essential (primary) hypertension: Secondary | ICD-10-CM

## 2023-04-15 DIAGNOSIS — E785 Hyperlipidemia, unspecified: Secondary | ICD-10-CM

## 2023-05-11 LAB — ECG 12-LEAD
Atrial Rate: 64 {beats}/min
IHS MUSE NARRATIVE AND IMPRESSION: NORMAL
P Axis: 50 degrees
P-R Interval: 124 ms
Q-T Interval: 438 ms
QRS Duration: 76 ms
QTC Calculation (Bezet): 451 ms
R Axis: -29 degrees
T Axis: 40 degrees
Ventricular Rate: 64 {beats}/min

## 2023-05-19 ENCOUNTER — Ambulatory Visit (INDEPENDENT_AMBULATORY_CARE_PROVIDER_SITE_OTHER): Payer: Medicare Other

## 2023-05-19 DIAGNOSIS — I10 Essential (primary) hypertension: Secondary | ICD-10-CM

## 2023-05-19 DIAGNOSIS — E785 Hyperlipidemia, unspecified: Secondary | ICD-10-CM

## 2023-05-20 ENCOUNTER — Telehealth (INDEPENDENT_AMBULATORY_CARE_PROVIDER_SITE_OTHER): Payer: Self-pay

## 2023-05-20 ENCOUNTER — Other Ambulatory Visit (INDEPENDENT_AMBULATORY_CARE_PROVIDER_SITE_OTHER): Payer: Self-pay | Admitting: Internal Medicine

## 2023-05-20 ENCOUNTER — Encounter (INDEPENDENT_AMBULATORY_CARE_PROVIDER_SITE_OTHER): Payer: Self-pay | Admitting: Internal Medicine

## 2023-05-20 DIAGNOSIS — S92353A Displaced fracture of fifth metatarsal bone, unspecified foot, initial encounter for closed fracture: Secondary | ICD-10-CM | POA: Insufficient documentation

## 2023-05-20 NOTE — Telephone Encounter (Signed)
 Chartspan message:    On 05/19/2023 a Patient Care Coordinator spoke with patient Aimee Wade DOB: [birth year],  Patient reports she fell on 05/10/2023 and cracked a bone in her ankle. She did not go to the emergency room,  but called a physician assistan

## 2023-06-14 ENCOUNTER — Other Ambulatory Visit (INDEPENDENT_AMBULATORY_CARE_PROVIDER_SITE_OTHER): Payer: Self-pay | Admitting: Internal Medicine

## 2023-06-18 ENCOUNTER — Ambulatory Visit (INDEPENDENT_AMBULATORY_CARE_PROVIDER_SITE_OTHER): Payer: Medicare Other

## 2023-06-18 DIAGNOSIS — I10 Essential (primary) hypertension: Secondary | ICD-10-CM

## 2023-06-18 DIAGNOSIS — E785 Hyperlipidemia, unspecified: Secondary | ICD-10-CM

## 2023-07-19 ENCOUNTER — Encounter (INDEPENDENT_AMBULATORY_CARE_PROVIDER_SITE_OTHER): Payer: Self-pay

## 2023-07-19 ENCOUNTER — Ambulatory Visit (FREE_STANDING_LABORATORY_FACILITY): Payer: Medicare Other | Admitting: Family Medicine

## 2023-07-19 ENCOUNTER — Ambulatory Visit (INDEPENDENT_AMBULATORY_CARE_PROVIDER_SITE_OTHER): Payer: Medicare Other

## 2023-07-19 VITALS — BP 138/87 | HR 77 | Temp 97.0°F | Resp 14 | Ht 65.0 in | Wt 165.0 lb

## 2023-07-19 DIAGNOSIS — A499 Bacterial infection, unspecified: Secondary | ICD-10-CM

## 2023-07-19 DIAGNOSIS — I10 Essential (primary) hypertension: Secondary | ICD-10-CM

## 2023-07-19 DIAGNOSIS — R350 Frequency of micturition: Secondary | ICD-10-CM

## 2023-07-19 DIAGNOSIS — E785 Hyperlipidemia, unspecified: Secondary | ICD-10-CM

## 2023-07-19 DIAGNOSIS — N390 Urinary tract infection, site not specified: Secondary | ICD-10-CM

## 2023-07-19 LAB — MCKESSON POCT UA 120
Bilirubin UA: NEGATIVE
Glucose UA: NEGATIVE
Ketone UA: NEGATIVE
Nitrite UA: NEGATIVE
Protein UA: NEGATIVE
Specific Gravity UA: <=1.005
Urobilinogen UA: NEGATIVE
pH UA: 6

## 2023-07-19 MED ORDER — CEPHALEXIN 500 MG PO CAPS
500.0000 mg | ORAL_CAPSULE | Freq: Two times a day (BID) | ORAL | 0 refills | Status: AC
Start: 2023-07-19 — End: 2023-07-26

## 2023-07-19 NOTE — Patient Instructions (Signed)
 Return to clinic in 3 days if symptoms fail to improve or worsen.

## 2023-07-19 NOTE — Progress Notes (Signed)
 [facility]  URGENT  CARE  PROGRESS NOTE     Patient: Aimee Wade   Date: 07/19/2023   MRN: [id number]       Aimee Wade is a 78 y.o. female      HISTORY     History obtained from: Patient    Chief Complaint   Patient presents with    Urinary Tract Infection Symptoms     3x days frequent urination and burning sensation with urination.         78 yo female presents with 3 days of dysuria. Denies fever, abdominal pain or flank pain.       Urinary Tract Infection Symptoms   Pertinent negatives include no flank pain.    Medical History[1]     Review of Systems   Constitutional:  Negative for fever.   Genitourinary:  Positive for dysuria. Negative for flank pain.       History:    Pertinent Past Medical, Surgical, Family and Social History were reviewed.      Current Medications[2]    Allergies[3]    Medications and Allergies reviewed.    PHYSICAL EXAM     Vitals:    07/19/23 1301   BP: 138/87   BP Site: Left arm   Patient Position: Sitting   Cuff Size: Medium   Pulse: 77   Resp: 14   Temp: 97 F (36.1 C)   TempSrc: Tympanic   SpO2: 96%   Weight: 74.8 kg (165 lb)   Height: 1.651 m (5' 5)       Physical Exam  Constitutional:       Appearance: Normal appearance.   HENT:      Head: Normocephalic and atraumatic.   Cardiovascular:      Rate and Rhythm: Normal rate and regular rhythm.   Pulmonary:      Effort: Pulmonary effort is normal. No respiratory distress.      Breath sounds: Normal breath sounds. No stridor. No wheezing, rhonchi or rales.   Abdominal:      General: Abdomen is flat.      Tenderness: There is no right CVA tenderness, left CVA tenderness, guarding or rebound.   Musculoskeletal:      Cervical back: Normal range of motion and neck supple.   Neurological:      Mental Status: She is alert.         UCC COURSE     LABS  The following POCT tests were ordered, reviewed and discussed with the patient/family.     Results       Procedure Component Value Units Date/Time    Culture, Urine [[id number]] Collected:  07/19/23 1327    Specimen: Urine, Clean Catch Updated: 07/19/23 1327    [employer] POCT UA [[id number]]  (Abnormal) Collected: 07/19/23 1322    Specimen: Clean Catch Updated: 07/19/23 1326     Color, UA Yellow     Clarity, UA Slightly Cloudy     Leukocytes UA 3+ (500 Leu/ul)     Nitrite UA Negative     Urobilinogen UA Negative (0.2 mg/dl)     Protein UA Negative     pH UA 6.0     Blood UA 1+ (25 Ery/ul)     Specific Gravity UA <=1.005     Ketone UA Negative     Bilirubin UA Negative     Glucose UA Negative            There were no x-rays reviewed with this patient  during the visit.    Current Inpatient Medications with Last Dose Taken[4]    PROCEDURES     Procedures    MEDICAL DECISION MAKING     History, physical, labs/studies most consistent with UTI as the diagnosis.        Chart Review:  Prior PCP, Specialist and/or ED notes reviewed today: No  Prior labs/images/studies reviewed today: No    Differential Diagnosis:   No evidence of pyelonephritis or urosepsis.       ASSESSMENT     Encounter Diagnoses   Name Primary?    Frequent urination     UTI (urinary tract infection), bacterial Yes                PLAN      PLAN:   Empiric Tx with Keflex . Urine culture pending. Return to clinic in 3 days if symptoms fail to improve or worsen.               Orders Placed This Encounter   Procedures    Culture, Urine    [employer] POCT UA     Requested Prescriptions     Signed Prescriptions Disp Refills    cephALEXin  (KEFLEX ) 500 MG capsule 14 capsule 0     Sig: Take 1 capsule (500 mg) by mouth 2 (two) times daily for 7 days       Discussed results and diagnosis with patient/family.  Reviewed warning signs for worsening condition, as well as, indications for follow-up with primary care physician and return to urgent care clinic.   Patient/family expressed understanding of instructions.     An After Visit Summary was provided to the patient.           [1]   Past Medical History:  Diagnosis Date    Acute left ankle pain 08/29/2021     AK (actinic keratosis) 03/26/2022    Colon polyp 1989?    At age 51 had my first  colonoscopy where the doctor found polyps which he removed. Have had colonoscopies  every 5 years.    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    Hypertension, unspecified type 05/06/2022    Inflammatory bowel disease     Lymphocytic colitis    Lymphedema     Left leg ; wears thigh high compression stockings    Lymphocytic colitis     Lymphocytic colitis 03/28/2021    Melanocytic nevi of trunk 03/26/2022    Nocturia 06/19/2021    Osteoporosis     Pelvic pain in female 01/19/2022    Peripheral vascular disease 1975    Surgery in left groin.    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 However when i moved up here and changed doctors the antibiotic  prescription was allowed to run out and now my uti's are back.    White coat syndrome with hypertension    [2]   Current Outpatient Medications:     acetaminophen (Tylenol 8 Hour Arthritis Pain) 650 MG CR tablet, Take 1 tablet (650 mg) by mouth daily, Disp: , Rfl:     Alpha-D-Galactosidase (Beano) Tab, Take by mouth as needed, Disp: , Rfl: 0    Apoaequorin (Prevagen) 10 MG Cap, Take 1 capsule (10 mg) by mouth daily, Disp: , Rfl:     atorvastatin  (LIPITOR) 10 MG tablet, TAKE 1 TABLET BY MOUTH DAILY, Disp: 90  tablet, Rfl: 3    D-Mannose 500 MG Cap, Take 2 capsules (1,000 mg) by mouth daily, Disp: , Rfl:     famotidine (PEPCID) 20 MG tablet, Take 1 tablet (20 mg) by mouth daily, Disp: , Rfl:     loratadine (CLARITIN) 10 MG tablet, Take 1 tablet (10 mg) by mouth as needed, Disp: 30 tablet, Rfl: 11    losartan  (COZAAR ) 50 MG tablet, TAKE 1 AND 1/2 TABLET BY MOUTH DAILY, Disp: 135 tablet, Rfl: 0    Melatonin 3 MG Cap, Take 1 capsule (3 mg) by mouth nightly as needed (sleep aide), Disp: , Rfl: 0    Multiple Vitamin (multivitamin) capsule, Take 1 capsule by mouth daily, Disp: , Rfl:     NON FORMULARY, Take 1 capsule by mouth daily CRANBERRY PACs, Disp: , Rfl:     Phenazopyridine  HCl (AZO TABS PO), Take by mouth as needed (for bladder infection), Disp: , Rfl:     Probiotic Product (PROBIOTIC PO), Take 1 capsule by mouth daily, Disp: , Rfl:     risedronate  (ACTONEL ) 150 MG tablet, Take 1 tablet (150 mg) by mouth every 30 (thirty) days, Disp: 12 tablet, Rfl: 0    vitamin D (cholecalciferol) 25 MCG (1000 UT) tablet, Take 1 tablet (25 mcg) by mouth daily, Disp: , Rfl:     cephALEXin  (KEFLEX ) 500 MG capsule, Take 1 capsule (500 mg) by mouth 2 (two) times daily for 7 days, Disp: 14 capsule, Rfl: 0    Multiple Vitamins-Minerals (multivitamin) Liquid, Orally (Patient not taking: Reported on 07/19/2023), Disp: , Rfl:     triamcinolone (KENALOG) 0.1 % cream, Apply 1 Application topically (Patient not taking: Reported on 07/19/2023), Disp: , Rfl:   [3]   Allergies  Allergen Reactions    Codeine Nausea Only     Other reaction(s): Unknown    Morphine Nausea Only     Other reaction(s): Unknown    Fructose Diarrhea    Sesame Oil     Soybean-Containing Drug Products    [4]   No current facility-administered medications for this visit.

## 2023-07-20 ENCOUNTER — Telehealth (INDEPENDENT_AMBULATORY_CARE_PROVIDER_SITE_OTHER): Payer: Self-pay

## 2023-07-20 NOTE — Telephone Encounter (Signed)
 Chartspan message:    On 07/20/2023 we spoke with patient Aimee Wade DOB: 02-05-1946 to follow up after a triage assessment was completed on  07/19/2023. The patient reported she was seen at [facility] care and diagnosed with a UTI. She has been placed on Cephalexin  for treatment.   - We wanted to notify the provider of this call. Please let us  know if we can be of further assistance. - Thank you

## 2023-07-21 LAB — CULTURE, URINE

## 2023-07-29 ENCOUNTER — Ambulatory Visit (FREE_STANDING_LABORATORY_FACILITY): Payer: Medicare Other | Admitting: Nurse Practitioner

## 2023-07-29 ENCOUNTER — Encounter (INDEPENDENT_AMBULATORY_CARE_PROVIDER_SITE_OTHER): Payer: Self-pay | Admitting: Nurse Practitioner

## 2023-07-29 VITALS — BP 132/87 | HR 82 | Temp 98.4°F | Ht 64.41 in | Wt 165.0 lb

## 2023-07-29 DIAGNOSIS — N393 Stress incontinence (female) (male): Secondary | ICD-10-CM

## 2023-07-29 DIAGNOSIS — D4819 Other specified neoplasm of uncertain behavior of connective and other soft tissue: Secondary | ICD-10-CM

## 2023-07-29 DIAGNOSIS — R300 Dysuria: Secondary | ICD-10-CM

## 2023-07-29 DIAGNOSIS — R3989 Other symptoms and signs involving the genitourinary system: Secondary | ICD-10-CM

## 2023-07-29 DIAGNOSIS — Z905 Acquired absence of kidney: Secondary | ICD-10-CM

## 2023-07-29 DIAGNOSIS — N390 Urinary tract infection, site not specified: Secondary | ICD-10-CM

## 2023-07-29 LAB — URINALYSIS POCT
Urine Bilirubin POCT: NEGATIVE
Urine Glucose POCT: NEGATIVE mg/dL
Urine Ketones POCT: NEGATIVE mg/dL
Urine Nitrites POCT: NEGATIVE
Urine Protein POCT: 100 mg/dL — AB
Urine Specific Gravity POCT: 1.01
Urine Urobilinogen POCT: 0.2 mg/dL (ref 0.2–2.0)
Urine pH POCT: 6 (ref 5.0–8.0)

## 2023-07-29 LAB — URINE MICROSCOPIC

## 2023-07-29 MED ORDER — SULFAMETHOXAZOLE-TRIMETHOPRIM 800-160 MG PO TABS
1.0000 | ORAL_TABLET | Freq: Two times a day (BID) | ORAL | 0 refills | Status: AC
Start: 2023-07-29 — End: 2023-08-03

## 2023-07-29 NOTE — Patient Instructions (Addendum)
 Timed voiding: urinate every 2-3 hours during the day or sooner if needed  Double voiding: urinate then 5 minutes after urinate again:  in the morning, around lunch time, and before bedtime  Positional voiding (sit straight up at the end of voiding then lean forward to help empty your bladder)    Continue D-mannose, Cranberry, and Probiotics  Urine culture was ordered. Bactrim  was prescribed.

## 2023-07-29 NOTE — Progress Notes (Signed)
 Subjective:      Patient ID: NESIAH JUMP is a 78 y.o. female     Chief Complaint:  1. Desmoid tumor of abdomen    2. H/O left nephrectomy    3. Recurrent UTI    4. SUI (stress urinary incontinence, female)    5. Suspected UTI    6. Dysuria    7. Bladder pain        01/19/2022 Dr. 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  Estrace  vaginal cream uses twice a month due to discomfort after, she reported that she has bothersome urinary symptoms when she uses the cream  Pelvic PT- she did not started, has problems with scheduling and insurance.She does kegels by herself and positional voiding and it helps with urinary incontinence.  RUS 01/29/2022: solitary right kidney, no hydronephrosis, PVR 81 cc  Today: no dysuria, no bladder pain, no pelvic pain, no urgency and frequency and no fever.  She is asymptomatic. She still has Bactrim  Dr. Jeannetta gave her just in case she develops symptoms.  Nocturia Q2 hours. Bedtime at 9:30 pm, last liquid at 8 pm water  She had colonoscopy last week- constipation on and off, last BM today  She snores     She reported that she uses scented and with lotion toilet paper for years        07/28/2022  She is here for 3  months follow up. No uti since last visit.  Takes daily D-Mannose, Probiotic, and cranberry pills  Nocturia Q2 hours, bedtime at 9 -10 pm, last liquid 7:30 pm( water)  She did not have Sleep study   Today: no dysuria, no hematuria, but she Reported bladder fullness/ pain and fatigue  She still leaks urine with cough, sneeze-never scheduled pelvic PT  She wears diapers 3x daily  Sometimes constipated- BM daily( sometimes she has to strain)  She stop Estrace  vaginal cream 2 months ago due to vaginal itching and burning         10/27/2022  Here for 3 months follow up.  Takes daily D-Mannose, Probiotic, and cranberry pills  She reported 3 UTI's since last visit.  Last UTI was few weeks ago, pt did not see provider and urine sample was not done. She double d-mannose and symptoms resolved.  Last week she had tooth infection and was started on Amoxicillin, she still takes abx. She denies UTI symptoms.     No constipation      01/26/2023  Mrs. Swickard presents today for 6 months follow up. She had 2 UTI's since last visit. In May ucx shows E.coli and on 01/08/2023 ucx shows  1-9 K Gram negative rods. In July pt was started on Macrobid  and symptoms got worse, then pt self tx herself with Bactrim  (which she had just in case of emergency) All symptoms resolved.     She takes D-mannose, Probiotics and Cranberry daily      Today pt is asymptomatic  Pt experiences  constipation sometimes.    TODAY 07/29/2023  Mrs. Davidson presents today for follow up.She has hx of recurrent UTI's.Status post left nephrectomy 2/2 desmoid tumor.     Since the last visit pt had 1 UTI in January 2025, which shows E.coli. UTI symptoms at that time: dysuria and urinary frequency. Pt was tx with keflex . Pt had diarrhea in the pants few days before  UTI started.  Pt completed abx few days ago. Since early morning pt has dysuria, bladder pain, frequency and urgency.  Pt takes daily D-mannose, Cranberry, and Probiotics  RUS 02/07/2023: Normal right kidney. No  hydronephrosis. No solid renal mass. No sonographic evidence of renal calculi.PVR 15 ml  Pt leaks urine with coughing and sneezing.She wears 2 depends daily. Pt tried pelvic PT, with not much improvement.         The following portions of the patient's history were reviewed and updated as appropriate: allergies, current medications, past family history, past medical history, past social history, past surgical history and problem list.    Review of Systems  As per HPI      Objective:   BP 132/87 (BP Site: Left arm, Patient Position: Sitting, Cuff Size: Medium)   Pulse 82   Temp 98.4 F (36.9 C) (Oral)   Ht 1.636 m (5' 4.41)   Wt 74.8 kg (165 lb)   BMI 27.96 kg/m   General appearance - alert, well appearing, and in no distress  Mental status - alert, oriented to person, place, and time, appropriate affect       PVR 145 ml    Lab Review        Component  Ref Range & Units 10:10  (07/29/23) 10 d ago  (07/19/23) 6 mo ago  (01/26/23) 6 mo ago  (01/08/23) 8 mo ago  (11/13/22) 9 mo ago  (10/27/22) 1 yr ago  (07/28/22)     Urine Color POCT  Light Yellow, Yellow, Dark yellow Yellow  Yellow   Yellow R Yellow R    Urine Clarity POCT  Clear Cloudy Abnormal   Clear   Clear R Cloudy Abnormal  R    Urine pH POCT  5.0 - 8.0 6.0  5.5   5.5 5.5    Urine Leukocyte Esterase POCT  Negative Large Abnormal   Negative   Trace Abnormal  Large Abnormal     Urine Nitrites POCT  Negative Negative  Negative   Negative Positive Abnormal     Urine Protein POCT  Negative mg/dL 899 Abnormal   Negative        Urine Glucose POCT  Negative mg/dL Negative  Negative   Negative Negative    Urine Ketones POCT  Negative mg/dL Negative  Negative   Negative Negative    Urine Urobilinogen POCT  0.2 - 2.0 mg/dL 0.2  0.2        Urine Bilirubin POCT  Negative Negative  Negative   Negative Negative    Urine Blood POCT  Negative Large Abnormal  1+ (25 Ery/ul) Abnormal  Negative 3+ (200 Ery/ul) Abnormal  Negative Negative Small Abnormal     Urine Specific Gravity  POCT 1.010  Radiology Review   Results for orders placed in visit on 01/26/23    US  Renal Kidney Bladder Complete    Narrative  HISTORY: Recurrent UTI. Status post left nephrectomy.    COMPARISON: 01/29/2022    FINDINGS:  Renal Measurements (long axis):  Right kidney: 11.4 cm  Left kidney is absent    Right kidney: Parenchymal echogenicity and thickness are normal. No  hydronephrosis. No solid renal mass. No sonographic evidence of renal  calculi.    Bladder: The bladder is imaged at a volume of 394 ml. The wall thickness is  normal for the degree of distention. No mass, calculi, or diverticulum is  detected. Post-void residual 15 ml.    Impression  1. Normal right kidney.  2. Normal urinary bladder with 13 cc post void residual.    Lamarr Bellini, MD  02/07/2023 10:56 AM         Assessment:     1. Desmoid tumor of abdomen    2. H/O left nephrectomy    3. Recurrent UTI    4. SUI (stress urinary incontinence, female)    5. Suspected UTI    6. Dysuria    7. Bladder pain        Urinalysis today shows large blood and large leukocytes. Ucx was ordered. Because pt is symptomatic. Bactrim  was prescribed.  PVR 145 ml, pt was advised to practice double,timed, and positional voiding to help empty her bladder better  Because pelvic PT did not help with SUI, we discussed surgical tx options such as Bulkaimd and urethral sling, but pt is not interested to have surgery.     Management and care for this patient was reviewed with the supervising physician.         Plan:     Patient Instructions   Timed voiding: urinate every 2-3 hours during the day or sooner if needed  Double voiding: urinate then 5 minutes after urinate again:  in the morning, around lunch time, and before bedtime  Positional voiding (sit straight up at the end of voiding then lean forward to help empty your bladder)    Continue D-mannose, Cranberry, and Probiotics  Urine culture was ordered. Bactrim  was prescribed.        Orders  Orders Placed This  Encounter   Procedures    Culture, Urine     Standing Status:   Future     Number of Occurrences:   1     Standing Expiration Date:   07/28/2024     Order Specific Question:   Release to patient     Answer:   Immediate    Urine Microscopic    Bladder scan

## 2023-07-30 LAB — CULTURE, URINE

## 2023-08-02 ENCOUNTER — Telehealth (INDEPENDENT_AMBULATORY_CARE_PROVIDER_SITE_OTHER): Payer: Self-pay | Admitting: Nurse Practitioner

## 2023-08-02 NOTE — Telephone Encounter (Signed)
 1,000-9,000 CFU/mL Escherichia coli Abnormal    ,1000-9,000 CFU/mL Enterococcus faecalis Abnormal      Pt was tx with Bactrim. All her bothersome urinary symptoms resolved.

## 2023-08-06 ENCOUNTER — Other Ambulatory Visit: Payer: Self-pay | Admitting: General Acute Care Hospital

## 2023-08-06 ENCOUNTER — Ambulatory Visit (INDEPENDENT_AMBULATORY_CARE_PROVIDER_SITE_OTHER): Payer: Self-pay

## 2023-08-06 DIAGNOSIS — Z1231 Encounter for screening mammogram for malignant neoplasm of breast: Secondary | ICD-10-CM

## 2023-08-06 DIAGNOSIS — I10 Essential (primary) hypertension: Secondary | ICD-10-CM

## 2023-08-06 DIAGNOSIS — E785 Hyperlipidemia, unspecified: Secondary | ICD-10-CM

## 2023-08-09 ENCOUNTER — Telehealth (INDEPENDENT_AMBULATORY_CARE_PROVIDER_SITE_OTHER): Payer: Self-pay

## 2023-08-09 NOTE — Telephone Encounter (Signed)
 Chartspan message:    On 08/06/2023 a Patient Care Coordinator spoke with patient Aimee Wade DOB: 17-Mar-1946. Patient reports after speaking with us  last month in regards to experiencing UTI symptoms the patient visited a new Urgent care. The patient states she was prescribed a new antibiotic that has resolved her symptoms. She was unable to provide us  the exact name of the medication at the time of the call. We wanted to notify the provider of this call. Please let us  know if we can be of further assistance. - Thank you.

## 2023-08-11 ENCOUNTER — Telehealth (INDEPENDENT_AMBULATORY_CARE_PROVIDER_SITE_OTHER): Payer: Self-pay | Admitting: Internal Medicine

## 2023-08-11 NOTE — Telephone Encounter (Signed)
 Aimee Wade  Called with [employer] in regards to patient. Patient requesting call back to get info on billing.  [phone]     Ref # [id number]    Please Advise

## 2023-08-25 ENCOUNTER — Ambulatory Visit (FREE_STANDING_LABORATORY_FACILITY): Payer: Medicare Other | Admitting: Family Medicine

## 2023-08-25 ENCOUNTER — Telehealth (INDEPENDENT_AMBULATORY_CARE_PROVIDER_SITE_OTHER): Payer: Self-pay

## 2023-08-25 ENCOUNTER — Encounter (INDEPENDENT_AMBULATORY_CARE_PROVIDER_SITE_OTHER): Payer: Self-pay

## 2023-08-25 VITALS — BP 134/42 | HR 77 | Temp 97.0°F | Resp 18 | Ht 64.0 in | Wt 165.0 lb

## 2023-08-25 DIAGNOSIS — R350 Frequency of micturition: Secondary | ICD-10-CM

## 2023-08-25 DIAGNOSIS — N390 Urinary tract infection, site not specified: Secondary | ICD-10-CM

## 2023-08-25 DIAGNOSIS — A499 Bacterial infection, unspecified: Secondary | ICD-10-CM

## 2023-08-25 LAB — MCKESSON POCT UA 120
Bilirubin UA: NEGATIVE
Glucose UA: NEGATIVE
Ketone UA: NEGATIVE
Nitrite UA: NEGATIVE
Specific Gravity UA: 1.01
Urobilinogen UA: NEGATIVE
pH UA: 6

## 2023-08-25 MED ORDER — CEFDINIR 300 MG PO CAPS
300.0000 mg | ORAL_CAPSULE | Freq: Two times a day (BID) | ORAL | 0 refills | Status: AC
Start: 2023-08-25 — End: 2023-09-01

## 2023-08-25 NOTE — Progress Notes (Signed)
 [facility]  URGENT  CARE  PROGRESS NOTE     Patient: Aimee Wade   Date: 08/25/2023   MRN: [id number]       Aimee Wade is a 78 y.o. female      HISTORY     History obtained from: Patient    Chief Complaint   Patient presents with    Urinary Tract Infection Symptoms     Pt with UTI symptoms with burning along with fullness and frequency since this morning denies fever or chills.        78 yo female with dysuria and urinary frequency this AM. Denies fever, abdominal or flank pain. No fever.       Urinary Tract Infection Symptoms   Associated symptoms include frequency.        Review of Systems   Genitourinary:  Positive for dysuria and frequency.       History:    Pertinent Past Medical, Surgical, Family and Social History were reviewed.      Current Medications[1]    Allergies[2]    Medications and Allergies reviewed.    PHYSICAL EXAM     Vitals:    08/25/23 1222   BP: 134/42   Pulse: 77   Resp: 18   Temp: 97 F (36.1 C)   SpO2: 99%   Weight: 74.8 kg (165 lb)   Height: 1.626 m (5' 4)       Physical Exam  Constitutional:       Appearance: Normal appearance.   HENT:      Head: Normocephalic and atraumatic.   Cardiovascular:      Rate and Rhythm: Normal rate and regular rhythm.   Pulmonary:      Effort: Pulmonary effort is normal.      Breath sounds: Normal breath sounds.   Abdominal:      General: Abdomen is flat.      Tenderness: There is no right CVA tenderness, left CVA tenderness, guarding or rebound.   Musculoskeletal:      Cervical back: Normal range of motion and neck supple.   Neurological:      Mental Status: She is alert.         UCC COURSE     LABS  The following POCT tests were ordered, reviewed and discussed with the patient/family.     Results       Procedure Component Value Units Date/Time    Culture, Urine [[id number]] Collected: 08/25/23 1248    Specimen: Urine, Clean Catch Updated: 08/25/23 1248    [employer] POCT UA [[id number]]  (Abnormal) Collected: 08/25/23 1218    Specimen: Clean Catch  Updated: 08/25/23 1219     Color, UA Yellow     Clarity, UA Cloudy     Leukocytes UA 3+ (500 Leu/ul)     Nitrite UA Negative     Urobilinogen UA Negative (0.2 mg/dl)     Protein UA Trace (15mg /dl)     pH UA 6.0     Blood UA 3+ (200 Ery/ul)     Specific Gravity UA 1.010     Ketone UA Negative     Bilirubin UA Negative     Glucose UA Negative            There were no x-rays reviewed with this patient during the visit.    Current Inpatient Medications with Last Dose Taken[3]    PROCEDURES     Procedures    MEDICAL DECISION MAKING     History,  physical, labs/studies most consistent with UTI as the diagnosis.        Chart Review:  Prior PCP, Specialist and/or ED notes reviewed today: No  Prior labs/images/studies reviewed today: No    Differential Diagnosis:   No evidence of urosepsis or pyelonephritis.       ASSESSMENT     Encounter Diagnoses   Name Primary?    Urinary frequency     UTI (urinary tract infection), bacterial Yes                PLAN      PLAN:   Empiric Tx with Cefdinir . Culture pending. Return to clinic in 3 days if symptoms fail to improve or worsen.               Orders Placed This Encounter   Procedures    Culture, Urine    [employer] POCT UA     Requested Prescriptions     Signed Prescriptions Disp Refills    cefdinir  (OMNICEF ) 300 MG capsule 14 capsule 0     Sig: Take 1 capsule (300 mg) by mouth 2 (two) times daily for 7 days       Discussed results and diagnosis with patient/family.  Reviewed warning signs for worsening condition, as well as, indications for follow-up with primary care physician and return to urgent care clinic.   Patient/family expressed understanding of instructions.     An After Visit Summary was provided to the patient.           [1]   Current Outpatient Medications:     acetaminophen (Tylenol 8 Hour Arthritis Pain) 650 MG CR tablet, Take 1 tablet (650 mg) by mouth daily, Disp: , Rfl:     Alpha-D-Galactosidase (Beano) Tab, Take by mouth as needed, Disp: , Rfl: 0    Apoaequorin  (Prevagen) 10 MG Cap, Take 1 capsule (10 mg) by mouth daily, Disp: , Rfl:     atorvastatin  (LIPITOR) 10 MG tablet, TAKE 1 TABLET BY MOUTH DAILY, Disp: 90 tablet, Rfl: 3    D-Mannose 500 MG Cap, Take 2 capsules (1,000 mg) by mouth daily, Disp: , Rfl:     famotidine (PEPCID) 20 MG tablet, Take 1 tablet (20 mg) by mouth daily, Disp: , Rfl:     loratadine (CLARITIN) 10 MG tablet, Take 1 tablet (10 mg) by mouth as needed, Disp: 30 tablet, Rfl: 11    losartan  (COZAAR ) 50 MG tablet, TAKE 1 AND 1/2 TABLET BY MOUTH DAILY, Disp: 135 tablet, Rfl: 0    Melatonin 3 MG Cap, Take 1 capsule (3 mg) by mouth nightly as needed (sleep aide), Disp: , Rfl: 0    Multiple Vitamin (multivitamin) capsule, Take 1 capsule by mouth daily, Disp: , Rfl:     Multiple Vitamins-Minerals (multivitamin) Liquid, , Disp: , Rfl:     NON FORMULARY, Take 1 capsule by mouth daily CRANBERRY PACs, Disp: , Rfl:     Phenazopyridine  HCl (AZO TABS PO), Take by mouth as needed (for bladder infection), Disp: , Rfl:     Probiotic Product (PROBIOTIC PO), Take 1 capsule by mouth daily, Disp: , Rfl:     risedronate  (ACTONEL ) 150 MG tablet, Take 1 tablet (150 mg) by mouth every 30 (thirty) days, Disp: 12 tablet, Rfl: 0    triamcinolone (KENALOG) 0.1 % cream, Apply 1 Application topically, Disp: , Rfl:     vitamin B-12 (CYANOCOBALAMIN) 500 MCG tablet, Take 1 tablet (500 mcg) by mouth daily, Disp: , Rfl:  vitamin D (cholecalciferol) 25 MCG (1000 UT) tablet, Take 1 tablet (25 mcg) by mouth daily, Disp: , Rfl:     cefdinir  (OMNICEF ) 300 MG capsule, Take 1 capsule (300 mg) by mouth 2 (two) times daily for 7 days, Disp: 14 capsule, Rfl: 0  [2]   Allergies  Allergen Reactions    Codeine Nausea Only     Other reaction(s): Unknown    Morphine Nausea Only     Other reaction(s): Unknown    Fructose Diarrhea    Sesame Oil     Soybean-Containing Drug Products    [3]   No current facility-administered medications for this visit.

## 2023-08-25 NOTE — Patient Instructions (Signed)
 Return to clinic in 3 days if symptoms fail to improve or worsen.

## 2023-08-25 NOTE — Telephone Encounter (Signed)
 Patient called in. Patient is having pressure, burning and urgency with cloudy urine. Denies fever/chills. No gross hematuria. The symptoms started this morning.  Told patient that she can go to [facility] or we can ask if we have an opening for Urgent UTI visit tomorrow. Per patient, she will go to [facility] near to home. If she has any questions/concerns she will call back.  No further question at this time.

## 2023-08-26 LAB — CULTURE, URINE: Culture Urine: NORMAL

## 2023-08-31 ENCOUNTER — Other Ambulatory Visit: Payer: Self-pay | Admitting: General Acute Care Hospital

## 2023-08-31 ENCOUNTER — Ambulatory Visit
Admission: RE | Admit: 2023-08-31 | Discharge: 2023-08-31 | Disposition: A | Discharge status: Home / Self Care | Payer: Medicare Other | Source: Ambulatory Visit | Attending: General Acute Care Hospital | Admitting: General Acute Care Hospital

## 2023-08-31 DIAGNOSIS — Z1231 Encounter for screening mammogram for malignant neoplasm of breast: Secondary | ICD-10-CM

## 2023-09-03 ENCOUNTER — Ambulatory Visit (INDEPENDENT_AMBULATORY_CARE_PROVIDER_SITE_OTHER): Payer: Self-pay

## 2023-09-03 DIAGNOSIS — I10 Essential (primary) hypertension: Secondary | ICD-10-CM

## 2023-09-03 DIAGNOSIS — E785 Hyperlipidemia, unspecified: Secondary | ICD-10-CM

## 2023-09-09 ENCOUNTER — Ambulatory Visit (FREE_STANDING_LABORATORY_FACILITY): Payer: Medicare Other | Admitting: Nurse Practitioner

## 2023-09-09 ENCOUNTER — Encounter (INDEPENDENT_AMBULATORY_CARE_PROVIDER_SITE_OTHER): Payer: Self-pay | Admitting: Nurse Practitioner

## 2023-09-09 VITALS — BP 134/84 | HR 77 | Temp 97.7°F | Ht 64.57 in | Wt 163.0 lb

## 2023-09-09 DIAGNOSIS — N3946 Mixed incontinence: Secondary | ICD-10-CM

## 2023-09-09 DIAGNOSIS — N393 Stress incontinence (female) (male): Secondary | ICD-10-CM

## 2023-09-09 DIAGNOSIS — R339 Retention of urine, unspecified: Secondary | ICD-10-CM

## 2023-09-09 DIAGNOSIS — D4819 Other specified neoplasm of uncertain behavior of connective and other soft tissue: Secondary | ICD-10-CM

## 2023-09-09 DIAGNOSIS — Z905 Acquired absence of kidney: Secondary | ICD-10-CM

## 2023-09-09 DIAGNOSIS — R102 Pelvic and perineal pain: Secondary | ICD-10-CM

## 2023-09-09 DIAGNOSIS — Z853 Personal history of malignant neoplasm of breast: Secondary | ICD-10-CM

## 2023-09-09 DIAGNOSIS — N390 Urinary tract infection, site not specified: Secondary | ICD-10-CM

## 2023-09-09 LAB — URINALYSIS POCT
Urine Bilirubin POCT: NEGATIVE
Urine Blood POCT: NEGATIVE
Urine Glucose POCT: NEGATIVE mg/dL
Urine Ketones POCT: NEGATIVE mg/dL
Urine Leukocyte Esterase POCT: NEGATIVE
Urine Nitrites POCT: NEGATIVE
Urine Protein POCT: NEGATIVE mg/dL
Urine Specific Gravity POCT: 1.015
Urine Urobilinogen POCT: 0.2 mg/dL (ref 0.2–2.0)
Urine pH POCT: 5.5 (ref 5.0–8.0)

## 2023-09-09 NOTE — Patient Instructions (Addendum)
 Timed voiding: urinate every 2-3 hours during the day or sooner if needed  Double voiding: urinate then 5 minutes after urinate again : in the morning and before bedtime  Positional voiding (sit straight up at the end of voiding then lean forward to help empty your bladder)    Continue daily D-mannose, Cranberry, and Probiotics  Drink plenty of water up to 67 ounces daily

## 2023-09-09 NOTE — Progress Notes (Signed)
 Subjective:      Patient ID: Aimee Wade is a 78 y.o. female     Chief Complaint:  1. Desmoid tumor of abdomen    2. H/O left nephrectomy    3. Recurrent UTI    4. SUI (stress urinary incontinence, female)    5. Incomplete bladder emptying    6. Acquired solitary kidney    7. Mixed urge and stress incontinence    8. Pelvic pain in female    9. History of right breast cancer        01/19/2022 Dr. 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  Estrace  vaginal cream uses twice a month due to discomfort after, she reported that she has bothersome urinary symptoms when she uses the cream  Pelvic PT- she did not started, has problems with scheduling and insurance.She does kegels by herself and positional voiding and it helps with urinary incontinence.  RUS 01/29/2022: solitary right kidney, no hydronephrosis, PVR 81 cc  Today: no dysuria, no bladder pain, no pelvic pain, no urgency and frequency and no fever.  She is asymptomatic. She still has Bactrim  Dr. Jeannetta gave her just in case she develops symptoms.  Nocturia Q2 hours. Bedtime at 9:30 pm, last liquid at 8 pm water  She had colonoscopy last week- constipation on and off, last BM today  She  snores     She reported that she uses scented and with lotion toilet paper for years        07/28/2022  She is here for 3 months follow up. No uti since last visit.  Takes daily D-Mannose, Probiotic, and cranberry pills  Nocturia Q2 hours, bedtime at 9 -10 pm, last liquid 7:30 pm( water)  She did not have Sleep study   Today: no dysuria, no hematuria, but she Reported bladder fullness/ pain and fatigue  She still leaks urine with cough, sneeze-never scheduled pelvic PT  She wears diapers 3x daily  Sometimes constipated- BM daily( sometimes she has to strain)  She stop Estrace  vaginal cream 2 months ago due to vaginal itching and burning         10/27/2022  Here for 3 months follow up.  Takes daily D-Mannose, Probiotic, and cranberry pills  She reported 3 UTI's since last visit.  Last UTI was few weeks ago, pt did not see provider and urine sample was not done. She double d-mannose and symptoms resolved.  Last week she had tooth infection and was started on Amoxicillin, she still takes abx. She denies UTI symptoms.     No constipation      01/26/2023  Aimee Wade presents  today for 6 months follow up. She had 2 UTI's since last visit. In May ucx shows E.coli and on 01/08/2023 ucx shows 1-9 K Gram negative rods. In July pt was started on Macrobid  and symptoms got worse, then pt self tx herself with Bactrim  (which she had just in case of emergency) All symptoms resolved.     She takes D-mannose, Probiotics and Cranberry daily      Today pt is asymptomatic  Pt experiences  constipation sometimes.     07/29/2023  Aimee Wade presents today for follow up.She has hx of recurrent UTI's.Status post left nephrectomy 2/2 desmoid tumor.      Since the last visit pt had 1 UTI in January 2025, which shows E.coli. UTI symptoms at that time: dysuria and urinary frequency. Pt was tx with keflex . Pt had diarrhea in the pants few days before  UTI started.  Pt completed abx few days ago. Since early morning pt has dysuria, bladder pain,  frequency and urgency.  Pt takes daily D-mannose, Cranberry, and Probiotics  RUS 02/07/2023: Normal right kidney. No hydronephrosis. No solid renal mass. No sonographic evidence of renal calculi.PVR 15 ml  Pt leaks urine with coughing and sneezing.She wears 2 depends daily. Pt tried pelvic PT, with not much improvement.     TODAY 09/09/2023  This is a 78 years old female with hx of recurrent UTI's, status post left nephrectomy 2/2 desmoid tumor. She presents today for follow up.  Asymptomatic today. No dysuria/no hematuria/no constipation  RUS 01/2023: normal  Urinary incontinence is getting better: SUI and occasional urge incontinence. I diaper during the day and 1 at night.  Pt takes daily D-mannose, Cranberry, and Probiotics  2 weeks ago pt had UTI symptoms: pt went to urgent care, ucx was negative, symptoms resolved with no tx    The following portions of the patient's history were reviewed and updated as appropriate: allergies, current medications, past family history, past medical history, past social history, past surgical history and problem list.    Review of Systems  As per HPI      Objective:   BP 134/84 (BP Site: Left arm, Patient Position: Sitting, Cuff Size: X-Large)   Pulse 77   Temp 97.7 F (36.5 C) (Oral)   Ht 1.64 m (5' 4.57)   Wt 73.9 kg (163 lb)   BMI 27.49 kg/m   General appearance - alert, well appearing, and in no distress  Mental status - alert, oriented to person, place, and time, appropriate affect     PVR 0 ml      Lab Review        Component  Ref Range & Units 09:51  (09/09/23) 2 wk ago  (08/25/23) 1 mo ago  (07/29/23) 1 mo ago  (07/19/23) 7 mo ago  (01/26/23) 8 mo ago  (01/08/23) 10 mo ago  (11/13/22)     Urine Color POCT  Light Yellow, Yellow, Dark yellow Yellow  Yellow  Yellow      Urine Clarity POCT  Clear Slightly Cloudy Abnormal   Cloudy Abnormal   Clear      Urine pH POCT  5.0 - 8.0 5.5  6.0  5.5      Urine Leukocyte Esterase POCT  Negative Negative  Large Abnormal   Negative       Urine Nitrites POCT  Negative Negative  Negative  Negative      Urine Protein POCT  Negative mg/dL Negative  899 Abnormal   Negative  Urine Glucose POCT  Negative mg/dL Negative  Negative  Negative      Urine Ketones POCT  Negative mg/dL Negative  Negative  Negative      Urine Urobilinogen POCT  0.2 - 2.0 mg/dL 0.2  0.2  0.2      Urine Bilirubin POCT  Negative Negative  Negative  Negative      Urine Blood POCT  Negative Negative 3+ (200 Ery/ul) Abnormal  Large Abnormal  1+ (25 Ery/ul) Abnormal  Negative 3+ (200 Ery/ul) Abnormal  Negative    Urine Specific Gravity POCT 1.015             Radiology Review     US  Renal Kidney Bladder Complete    Narrative  HISTORY: Recurrent UTI. Status post left nephrectomy.    COMPARISON: 01/29/2022    FINDINGS:  Renal Measurements (long axis):  Right kidney: 11.4 cm  Left kidney is absent    Right kidney: Parenchymal echogenicity and thickness are normal. No  hydronephrosis. No solid renal mass. No sonographic evidence of renal  calculi.    Bladder: The bladder is imaged at a volume of 394 ml. The wall thickness is  normal for the degree of distention. No mass, calculi, or diverticulum is  detected. Post-void residual 15 ml.    Impression  1. Normal right kidney.  2. Normal urinary bladder with 13 cc post void residual.    Lamarr Bellini, MD  02/07/2023 10:56 AM         Assessment:     1. Desmoid tumor of abdomen    2. H/O left nephrectomy    3. Recurrent UTI    4. SUI (stress urinary incontinence, female)    5. Incomplete bladder emptying    6. Acquired solitary kidney    7. Mixed urge and stress incontinence    8. Pelvic pain in female    9. History of right breast cancer        PVR 0ml, pt asymptomatic. She was advised to continue timed, double and positional voiding.  Pt was advised to continue  daily D-mannose, Cranberry, and Probiotics, to prevent future UTI's.  We discussed surgical tx for SUI-pt refused, her symptoms are not bothersome enough  We discussed UUI tx with  medication, pt stated that her symptoms are not bothersome enough to start medication.    Management and care for this patient was reviewed with the supervising physician.       Plan:     Patient Instructions   Timed voiding: urinate every 2-3 hours during the day or sooner if needed  Double voiding: urinate then 5 minutes after urinate again : in the morning and before bedtime  Positional voiding (sit straight up at the end of voiding then lean forward to help empty your bladder)    Continue daily D-mannose, Cranberry, and Probiotics  Drink plenty of water up to 67 ounces daily        Orders  Orders Placed This Encounter   Procedures    Bladder scan

## 2023-09-15 ENCOUNTER — Other Ambulatory Visit (INDEPENDENT_AMBULATORY_CARE_PROVIDER_SITE_OTHER): Payer: Self-pay | Admitting: Internal Medicine

## 2023-09-15 NOTE — Telephone Encounter (Signed)
 losartan (COZAAR) 50 MG tablet   Last filled December 2024.   Last appt September 2024.  Pt has upcoming appt with dr Chipper Herb 10/27/23.  Queued up 90 day supply with 0 refills.

## 2023-10-01 ENCOUNTER — Ambulatory Visit (INDEPENDENT_AMBULATORY_CARE_PROVIDER_SITE_OTHER): Payer: Self-pay

## 2023-10-01 DIAGNOSIS — I10 Essential (primary) hypertension: Secondary | ICD-10-CM

## 2023-10-01 DIAGNOSIS — E785 Hyperlipidemia, unspecified: Secondary | ICD-10-CM

## 2023-10-04 ENCOUNTER — Other Ambulatory Visit (INDEPENDENT_AMBULATORY_CARE_PROVIDER_SITE_OTHER): Payer: Self-pay | Admitting: Internal Medicine

## 2023-10-04 ENCOUNTER — Telehealth (INDEPENDENT_AMBULATORY_CARE_PROVIDER_SITE_OTHER): Payer: Self-pay

## 2023-10-04 NOTE — Telephone Encounter (Signed)
 Chartspan message:    On 10/01/2023 a Patient Care Coordinator spoke with patient Aimee Wade DOB 1946/03/23 and completed a Depression Screening. The patient scored 1 out of 6 on the Depression Scale. The patient answered that  they have had little interest or pleasure in doing things for several days. We wanted to notify the provider of this call. Please confirm receipt and provide an update on any next steps initiated, if necessary. Don't hesitate to contact us  if we can be of further assistance. - Thank you. Raymona Caldwell RN

## 2023-10-23 ENCOUNTER — Encounter (INDEPENDENT_AMBULATORY_CARE_PROVIDER_SITE_OTHER): Payer: Self-pay | Admitting: Physician Assistant

## 2023-10-23 ENCOUNTER — Encounter (INDEPENDENT_AMBULATORY_CARE_PROVIDER_SITE_OTHER): Payer: Self-pay | Admitting: Internal Medicine

## 2023-10-23 ENCOUNTER — Ambulatory Visit (INDEPENDENT_AMBULATORY_CARE_PROVIDER_SITE_OTHER): Admitting: Physician Assistant

## 2023-10-23 VITALS — BP 147/93 | HR 102 | Temp 97.7°F | Resp 18 | Ht 64.0 in | Wt 164.0 lb

## 2023-10-23 DIAGNOSIS — R051 Acute cough: Secondary | ICD-10-CM

## 2023-10-23 DIAGNOSIS — U071 COVID-19: Secondary | ICD-10-CM

## 2023-10-23 DIAGNOSIS — R509 Fever, unspecified: Secondary | ICD-10-CM

## 2023-10-23 DIAGNOSIS — R112 Nausea with vomiting, unspecified: Secondary | ICD-10-CM

## 2023-10-23 DIAGNOSIS — R0981 Nasal congestion: Secondary | ICD-10-CM

## 2023-10-23 DIAGNOSIS — J029 Acute pharyngitis, unspecified: Secondary | ICD-10-CM

## 2023-10-23 HISTORY — DX: COVID-19: U07.1

## 2023-10-23 LAB — STREP A POCT GOHEALTH: Rapid Strep A Screen POCT: NEGATIVE

## 2023-10-23 LAB — POCT INFLUENZA A/B
POCT Rapid Influenza A AG: NEGATIVE
POCT Rapid Influenza B AG: NEGATIVE

## 2023-10-23 LAB — POC COVID QUICKVUE ANTIGEN: QuickVue SARS COV2 Antigen POCT: POSITIVE — AB

## 2023-10-23 MED ORDER — NIRMATRELVIR&RITONAVIR 300/100 20 X 150 MG & 10 X 100MG PO TBPK
3.0000 | ORAL_TABLET | Freq: Two times a day (BID) | ORAL | 0 refills | Status: DC
Start: 2023-10-23 — End: 2023-10-23

## 2023-10-23 MED ORDER — ONDANSETRON 4 MG PO TBDP
4.0000 mg | ORAL_TABLET | Freq: Four times a day (QID) | ORAL | 0 refills | Status: AC | PRN
Start: 2023-10-23 — End: 2023-10-30

## 2023-10-23 MED ORDER — ONDANSETRON 4 MG PO TBDP
4.0000 mg | ORAL_TABLET | Freq: Four times a day (QID) | ORAL | 0 refills | Status: DC | PRN
Start: 2023-10-23 — End: 2023-10-23

## 2023-10-23 MED ORDER — NIRMATRELVIR&RITONAVIR 300/100 20 X 150 MG & 10 X 100MG PO TBPK
3.0000 | ORAL_TABLET | Freq: Two times a day (BID) | ORAL | 0 refills | Status: AC
Start: 2023-10-23 — End: 2023-10-28

## 2023-10-23 MED ORDER — ONDANSETRON 4 MG PO TBDP
4.0000 mg | ORAL_TABLET | Freq: Once | ORAL | Status: AC
Start: 2023-10-23 — End: 2023-10-23
  Administered 2023-10-23: 4 mg via ORAL

## 2023-10-23 NOTE — Progress Notes (Signed)
 Urgent Care Provider Note    Patient: Aimee Wade   Date: 10/23/2023   MRN: [id number]       Subjective     Chief Complaint   Patient presents with    Cough    Fever    Headache    Sore Throat    Nasal Congestion    Emesis     Started 1 day ago.  Husband covid positive 2 days prior to onset of symptoms       HPI:  HPI    Aimee Wade is a 78 y.o. female presenting symptoms concerning for COVID-19, including fever, cough, nasal congestion, headache, sore throat, and vomiting. Patient denies diarrhea, SOB, chest pain, and rash. Symptoms began 1 days ago.    High risk for serious illness: Yes    Pertinent Past Medical, Surgical, Family and Social History were reviewed.    Current Medications[1]    Allergies[2]    Medications and Allergies reviewed.         Objective     Vitals:    10/23/23 0817   BP: (!) 147/93   Pulse: (!) 102   Resp: 18   Temp: 97.7 F (36.5 C)   SpO2: 95%     Body mass index is 28.15 kg/m.    Physical Exam    General: no acute distress, well developed, well nourished.    HEENT: mucous membranes moist    Heart: regular rate and rhythm    Lungs: clear to auscultation bilaterally, no respiratory distress    Skin: warm, dry, no rashes on exposed skin    UCC COURSE  LABS  The following POCT tests were ordered, reviewed and discussed with the patient/family.     Results for orders placed or performed in visit on 10/23/23 (from the past 24 hours)   QuickVue SARS-COV-2 Antigen POCT    Collection Time: 10/23/23  8:35 AM   Result Value    QuickVue SARS COV2 Antigen POCT Positive (A)       There were no x-rays reviewed with this patient during the visit.    Current Inpatient Medications with Last Dose Taken[3]          PROCEDURES:  Procedures    MDM:  DDx: COVID-19, Influenza, RSV, URI, viral syndrome, Pharyngitis, Bronchitis, Sinusitis, Allergic Rhinitis, Pneumonia          Assessment         Aimee Wade was seen today for cough, fever, headache, sore throat, nasal congestion and emesis.    Diagnoses and all  orders for this visit:    COVID-19  -     Discontinue: nirmatrelvir-ritonavir (PAXLOVID) 20 x 150 MG & 10 x 100MG  dose pack(emergency use authorization); Take 3 tablets by mouth 2 (two) times daily for 5 days The dosage for PAXLOVID is 300 mg nirmatrelvir (two 150 mg tablets) with 100 mg ritonavir (one 100 mg tablet) with all three tablets taken together.  -     nirmatrelvir-ritonavir (PAXLOVID) 20 x 150 MG & 10 x 100MG  dose pack(emergency use authorization); Take 3 tablets by mouth 2 (two) times daily for 5 days The dosage for PAXLOVID is 300 mg nirmatrelvir (two 150 mg tablets) with 100 mg ritonavir (one 100 mg tablet) with all three tablets taken together.    Acute cough  -     QuickVue SARS-COV-2 Antigen POCT; Future  -     POCT Influenza A/B; Future  -     Rapid Strep A POCT;  Future  -     QuickVue SARS-COV-2 Antigen POCT  -     POCT Influenza A/B  -     Rapid Strep A POCT    Fever, unspecified  -     QuickVue SARS-COV-2 Antigen POCT; Future  -     POCT Influenza A/B; Future  -     Rapid Strep A POCT; Future  -     QuickVue SARS-COV-2 Antigen POCT  -     POCT Influenza A/B  -     Rapid Strep A POCT    Sorethroat  -     QuickVue SARS-COV-2 Antigen POCT; Future  -     POCT Influenza A/B; Future  -     Rapid Strep A POCT; Future  -     QuickVue SARS-COV-2 Antigen POCT  -     POCT Influenza A/B  -     Rapid Strep A POCT    Nasal congestion  -     QuickVue SARS-COV-2 Antigen POCT; Future  -     POCT Influenza A/B; Future  -     Rapid Strep A POCT; Future  -     QuickVue SARS-COV-2 Antigen POCT  -     POCT Influenza A/B  -     Rapid Strep A POCT    Nausea and vomiting, unspecified vomiting type  -     ondansetron (ZOFRAN-ODT) disintegrating tablet 4 mg  -     Discontinue: ondansetron (ZOFRAN-ODT) 4 MG disintegrating tablet; Dissolve 1 tablet (4 mg) in the mouth every 6 (six) hours as needed for Nausea  -     ondansetron (ZOFRAN-ODT) 4 MG disintegrating tablet; Dissolve 1 tablet (4 mg) in the mouth every 6 (six) hours  as needed for Nausea        Outpatient COVID Treatment Plan  Rapid COVID Antigen positive   Clinical suspicion for COVID-19 infection Yes   Meets age/weight criteria for Paxlovid Yes  Renal function assessment:   Medications have been reviewed and there are significant drug-drug interactions  Paxlovid Recommendation: stop atorvastatin  and start paxlovid  Patient is not referred to [facility] and meets eligibility criteria for infusion.    Plan and follow-up discussed with patient. See AVS for further documentation.       [1]   Current Outpatient Medications:     acetaminophen (Tylenol 8 Hour Arthritis Pain) 650 MG CR tablet, Take 1 tablet (650 mg) by mouth daily, Disp: , Rfl:     Alpha-D-Galactosidase (Beano) Tab, Take by mouth as needed, Disp: , Rfl: 0    Apoaequorin (Prevagen) 10 MG Cap, Take 1 capsule (10 mg) by mouth daily, Disp: , Rfl:     atorvastatin  (LIPITOR) 10 MG tablet, TAKE 1 TABLET BY MOUTH DAILY, Disp: 90 tablet, Rfl: 3    D-Mannose 500 MG Cap, Take 2 capsules (1,000 mg) by mouth daily, Disp: , Rfl:     famotidine (PEPCID) 20 MG tablet, Take 1 tablet (20 mg) by mouth daily, Disp: , Rfl:     loratadine (CLARITIN) 10 MG tablet, Take 1 tablet (10 mg) by mouth as needed, Disp: 30 tablet, Rfl: 11    losartan  (COZAAR ) 50 MG tablet, TAKE 1.5 TABLET BY MOUTH DAILY, Disp: 135 tablet, Rfl: 1    Melatonin 3 MG Cap, Take 1 capsule (3 mg) by mouth nightly as needed (sleep aide), Disp: , Rfl: 0    Multiple Vitamin (multivitamin) capsule, Take 1 capsule by mouth daily,  Disp: , Rfl:     NON FORMULARY, Take 1 capsule by mouth daily CRANBERRY PACs, Disp: , Rfl:     Phenazopyridine  HCl (AZO TABS PO), Take by mouth as needed (for bladder infection), Disp: , Rfl:     pimecrolimus (ELIDEL) 1 % cream, , Disp: , Rfl:     Probiotic Product (PROBIOTIC PO), Take 1 capsule by mouth daily, Disp: , Rfl:     risedronate  (ACTONEL ) 150 MG tablet, Take 1 tablet (150 mg) by mouth every 30 (thirty) days, Disp: 12 tablet,  Rfl: 0    vitamin B-12 (CYANOCOBALAMIN) 500 MCG tablet, Take 1 tablet (500 mcg) by mouth daily, Disp: , Rfl:     vitamin D (cholecalciferol) 25 MCG (1000 UT) tablet, Take 1 tablet (25 mcg) by mouth daily, Disp: , Rfl:     doxycycline (VIBRAMYCIN) 100 MG capsule, , Disp: , Rfl:     Multiple Vitamins-Minerals (multivitamin) Liquid, , Disp: , Rfl:     nirmatrelvir-ritonavir (PAXLOVID) 20 x 150 MG & 10 x 100MG  dose pack(emergency use authorization), Take 3 tablets by mouth 2 (two) times daily for 5 days The dosage for PAXLOVID is 300 mg nirmatrelvir (two 150 mg tablets) with 100 mg ritonavir (one 100 mg tablet) with all three tablets taken together., Disp: 30 tablet, Rfl: 0    ondansetron (ZOFRAN-ODT) 4 MG disintegrating tablet, Dissolve 1 tablet (4 mg) in the mouth every 6 (six) hours as needed for Nausea, Disp: 8 tablet, Rfl: 0    triamcinolone (KENALOG) 0.1 % cream, Apply 1 Application topically (Patient not taking: Reported on 10/23/2023), Disp: , Rfl:   No current facility-administered medications for this visit.  [2]   Allergies  Allergen Reactions    Codeine Nausea Only     Other reaction(s): Unknown    Morphine Nausea Only     Other reaction(s): Unknown    Fructose Diarrhea    Sesame Oil     Soybean-Containing Drug Products    [3]   No current facility-administered medications for this visit.

## 2023-10-23 NOTE — Patient Instructions (Signed)
 You are positive for COVID.  This is a virus.  We are prescribing you Paxlovid.  If you are on a statin medication for cholesterol (ex. atorvastatin, rosuvastatin, etc), you need to discontinue this medication while you are taking Paxlovid for 5 days after you are done with Paxlovid.  Continue to treat your symptoms with OTC medications and/or medications prescribed to you today.  Follow-up with your primary care doctor in 1 week, especially if not improving.  Return or go to the emergency department for acutely worsening symptoms including shortness of breath, chest pain, vomiting, changes in behavior, or other acute symptoms or concerns.    Viral illnesses typically last 7-10 days but can last up to 2-3 weeks at times.   Antibiotics do not help with viral illnesses and are not clinically necessary at this time.   Take over-the-counter medications as discussed.   Tylenol/ibuprofen can help with overall malaise/fever.   Antihistamines and decongestants can help with congestion/runny nose and sinus pain. If you do not have a history of high blood pressure, you may take Sudafed for this.   A humidifier in your room can also help alleviate any cough/congestion that you develop.   Flonase twice a day will help any congestion that may develop, you can get this over-the-counter.  If you have history of seasonal allergies, consider a daily over-the-counter allergy medication like Claritin, Zyrtec, Xyzal or Allegra. These can take a week to become helpful.   If your symptoms worsen, or you develop fevers, please return.   If you develop shortness of breath or trouble breathing, please go to the emergency room.

## 2023-10-25 ENCOUNTER — Telehealth (INDEPENDENT_AMBULATORY_CARE_PROVIDER_SITE_OTHER): Admitting: Physician Assistant

## 2023-10-25 ENCOUNTER — Encounter (INDEPENDENT_AMBULATORY_CARE_PROVIDER_SITE_OTHER): Payer: Self-pay | Admitting: Physician Assistant

## 2023-10-25 ENCOUNTER — Other Ambulatory Visit (INDEPENDENT_AMBULATORY_CARE_PROVIDER_SITE_OTHER): Payer: Self-pay | Admitting: Internal Medicine

## 2023-10-25 DIAGNOSIS — K521 Toxic gastroenteritis and colitis: Secondary | ICD-10-CM

## 2023-10-25 DIAGNOSIS — R300 Dysuria: Secondary | ICD-10-CM

## 2023-10-25 NOTE — Progress Notes (Signed)
 Have you seen any specialists/other providers since your last visit with [place]?    No      The patient was informed that the following HM items are still outstanding:   Health Maintenance Due   Topic Date Due    Advance Directive on File  Never done

## 2023-10-25 NOTE — Progress Notes (Signed)
 [facility] INTERNAL MEDICINE         Date of Virtual Visit: 10/25/2023 1:32 PM        Patient ID: Aimee Wade is a 78 y.o. female.  Attending Physician: Lillard Reichmann, PA       Telemedicine Eligibility:     Location patient :  [x]  Home     []  Office       []  School      []  Healthcare facility          []  Other  [x]  [place]   []  [place]   []  [place] []  [place]   []  Other:    Telemedicine Visit Consent  [x]  Patient has given verbal consent for delivery of healthcare via telehealth  []  The parent/legal guardian of the patient has given verbal consent for delivery of healthcare via telehealth    Location provider: :  Medical Office [x]     Home  []        Temporary Location []    Other: []      Real Time Synchronous TeleHealth Tools:  [x]  Epic Video Client   []  Vidyo   []  Doxy.me   []  Other  []  Audio-Video          []  Audio only  []  This visit was converted to an audio encounter due to the inability to use video capability as intended               Chief Complaint:    Chief Complaint   Patient presents with    Covid-19     Pt tested pos for Covid 4/26             HPI:    History of Present Illness  The patient, with a recent diagnosis of COVID-19, presents with ongoing symptoms and concerns. She was diagnosed on April 26th at an urgent care center and has been taking Paxlovid for treatment. She reports experiencing diarrhea, which she is unsure if it is a side effect of the medication or related to her diet. She also reports vomiting, for which she has been taking Zofran, and has noticed some improvement in her symptoms. She has been experiencing a burning sensation when urinating, but denies having a fever or other typical symptoms of a urinary tract infection (UTI). She also reports an unusual discharge, which she is unsure of the origin. She has a history of frequent UTIs, usually associated with diarrhea.           Current Meds:    Medications Taking[1]        Allergies:    Allergies[2]        Social History:    Social History     Occupational History    Not on file   Tobacco Use    Smoking status: Never    Smokeless tobacco: Never   Vaping Use    Vaping status: Never Used   Substance and Sexual Activity    Alcohol use: Not Currently    Drug use: Never    Sexual activity: Not Currently     Partners: Male            The following sections were reviewed this encounter by the provider:   Tobacco  Allergies  Meds  Problems  Med Hx  Surg Hx  Fam Hx             Vital Signs:    There were no vitals filed for this visit.  ROS:    See HPI.             Results:    Results           Physical Exam:    Physical Exam   GENERAL APPEARANCE: alert, oriented, in no acute distress, well groomed. Appears stated age.  HEAD: normocephalic, atraumatic  LUNGS: no distress/normal effort, no use of accessory muscles in breathing  PSYCHIATRIC: alert, oriented, appropriate mood and affect  KEY EXAM FINDINGS:   Physical Exam             Assessment:        1. Diarrhea due to drug    2. Dysuria  - Culture, Urine; Future  - Urinalysis with Microscopic Exam; Future                Plan:    Assessment & Plan  COVID-19 infection  Diagnosed on October 23, 2023. Advised isolation and mask use to minimize transmission risk in her community.  - Advise isolation through day 5.  - Recommend wearing a mask from day 5 to 10 when in public.    Diarrhea due to Paxlovid  Diarrhea likely from Paxlovid. Emphasized hydration and diet adjustments.  - Encourage drinking 2 liters of water daily.  - Advise consumption of easily digestible foods such as potatoes, soup, bread, and toast.    Nausea and vomiting  Nausea and vomiting improved with Zofran. Emphasized hydration.  - Use Zofran as needed for nausea.  - Encourage hydration with 2 liters of water daily.    Burning sensation during urination  Burning likely due to dehydration. Urine test pending if symptoms persist.  - Encourage drinking 2 liters of water daily.  - Place order for urine test  in chart for future use if symptoms persist.  - Advise to contact if symptoms worsen or persist for further evaluation.    Recording duration: 10 minutes     Total Time Spent  [x]  Video visit- The time spent in medical discussion during this visit was 11 to 20 minutes   []  Audio visits Only           Follow-up:    Return if symptoms worsen or fail to improve.     Lillard Reichmann, PA       Time spent on telemedicine visit:                             [1]   Outpatient Medications Marked as Taking for the 10/25/23 encounter (Telemedicine Visit) with Loredana Medellin C, PA   Medication Sig Dispense Refill    acetaminophen (Tylenol 8 Hour Arthritis Pain) 650 MG CR tablet Take 1 tablet (650 mg) by mouth daily      Alpha-D-Galactosidase (Beano) Tab Take by mouth as needed  0    Apoaequorin (Prevagen) 10 MG Cap Take 1 capsule (10 mg) by mouth daily      atorvastatin  (LIPITOR) 10 MG tablet TAKE 1 TABLET BY MOUTH DAILY 90 tablet 3    D-Mannose 500 MG Cap Take 2 capsules (1,000 mg) by mouth daily      famotidine (PEPCID) 20 MG tablet Take 1 tablet (20 mg) by mouth daily      loratadine (CLARITIN) 10 MG tablet Take 1 tablet (10 mg) by mouth as needed 30 tablet 11    losartan  (COZAAR ) 50 MG tablet TAKE 1.5 TABLET BY MOUTH DAILY 135 tablet 1  Melatonin 3 MG Cap Take 1 capsule (3 mg) by mouth nightly as needed (sleep aide)  0    Multiple Vitamin (multivitamin) capsule Take 1 capsule by mouth daily      nirmatrelvir-ritonavir (PAXLOVID) 20 x 150 MG & 10 x 100MG  dose pack(emergency use authorization) Take 3 tablets by mouth 2 (two) times daily for 5 days The dosage for PAXLOVID is 300 mg nirmatrelvir (two 150 mg tablets) with 100 mg ritonavir (one 100 mg tablet) with all three tablets taken together. 30 tablet 0    NON FORMULARY Take 1 capsule by mouth daily CRANBERRY PACs      ondansetron (ZOFRAN-ODT) 4 MG disintegrating tablet Dissolve 1 tablet (4 mg) in the mouth every 6 (six) hours as needed for Nausea 8 tablet 0     Phenazopyridine  HCl (AZO TABS PO) Take by mouth as needed (for bladder infection)      pimecrolimus (ELIDEL) 1 % cream       Probiotic Product (PROBIOTIC PO) Take 1 capsule by mouth daily      risedronate  (ACTONEL ) 150 MG tablet Take 1 tablet (150 mg) by mouth every 30 (thirty) days 12 tablet 0    vitamin B-12 (CYANOCOBALAMIN) 500 MCG tablet Take 1 tablet (500 mcg) by mouth daily      vitamin D (cholecalciferol) 25 MCG (1000 UT) tablet Take 1 tablet (25 mcg) by mouth daily     [2]   Allergies  Allergen Reactions    Codeine Nausea Only     Other reaction(s): Unknown    Morphine Nausea Only     Other reaction(s): Unknown    Fructose Diarrhea    Sesame Oil     Soybean-Containing Drug Products

## 2023-10-25 NOTE — Patient Instructions (Signed)
ACUTE DIARRHEA Instructions:    Rest from strenuous activity, switch to a light diet with small frequent snacks: crackers, yogurt, toast, bananas, applesauce or rice.   Advance your diet as tolerated.    MOST IMPORTANTLY, you will need to increase water intake while ill.  Goal is 2 liters or more water per day in small frequent sips while ill.     WE can prescribe ondansetron (aka "ZOFRAN") if needed to control nausea and to enable better fluid intake.  CALL FOR THIS IF YOU NEED THIS MED.    DO NOT take Pepto bismol or other anti-diarrheal meds.    TYLENOL and/or IBUPROFEN for body aches and fever.       Use OTC anti-diarrhea meds ONLY  if absolutely needed for work or travel, such as Immodium, Kaopectate.    Call or return if vomiting or diarrhea is persistent or if abdominal pain or severe/persistent fevers, or if not getting better in 3-4 days.

## 2023-10-27 ENCOUNTER — Ambulatory Visit (INDEPENDENT_AMBULATORY_CARE_PROVIDER_SITE_OTHER): Payer: Medicare Other | Admitting: Internal Medicine

## 2023-10-29 ENCOUNTER — Ambulatory Visit (INDEPENDENT_AMBULATORY_CARE_PROVIDER_SITE_OTHER): Payer: Self-pay

## 2023-11-01 ENCOUNTER — Telehealth (INDEPENDENT_AMBULATORY_CARE_PROVIDER_SITE_OTHER): Payer: Self-pay

## 2023-11-01 ENCOUNTER — Encounter (INDEPENDENT_AMBULATORY_CARE_PROVIDER_SITE_OTHER): Payer: Self-pay | Admitting: Internal Medicine

## 2023-11-01 NOTE — Telephone Encounter (Signed)
 [facility] msg:    On 10/29/2023 a Patient Care Coordinator spoke with patient Aimee Wade DOB: [birth year]. The patient reports she went to [facility] on 10/23/2023, where she was diagnosed with COVID. She was prescribed Zofran  due to vomiting and Paxlovid , which she finished the last dose on 05/01. - We wanted to notify the provider of this call. Please confirm receipt and provide an update on any next steps initiated, if necessary. Don't hesitate to contact us  if we can be of further assistance. - Thank you.

## 2023-12-03 ENCOUNTER — Ambulatory Visit (INDEPENDENT_AMBULATORY_CARE_PROVIDER_SITE_OTHER)

## 2023-12-03 DIAGNOSIS — E785 Hyperlipidemia, unspecified: Secondary | ICD-10-CM

## 2023-12-03 DIAGNOSIS — I10 Essential (primary) hypertension: Secondary | ICD-10-CM

## 2023-12-18 ENCOUNTER — Other Ambulatory Visit (INDEPENDENT_AMBULATORY_CARE_PROVIDER_SITE_OTHER): Payer: Self-pay | Admitting: Internal Medicine

## 2023-12-21 MED ORDER — RISEDRONATE SODIUM 150 MG PO TABS
150.0000 mg | ORAL_TABLET | ORAL | 0 refills | Status: DC
Start: 2023-12-21 — End: 2024-02-02

## 2023-12-21 NOTE — Telephone Encounter (Signed)
 Prescription Refill Checklist    Requested Prescriptions     Pending Prescriptions Disp Refills    risedronate  (ACTONEL ) 150 MG tablet 12 tablet 0     Sig: Take 1 tablet (150 mg) by mouth every 30 (thirty) days       Patient comment: [facility] nurse was supposed to put in a request for a refill as prescription was expired. Did she?        Done Process Reviewed Notes     [x]   Verify medication in patient's chart.  Has the medication been previously prescribed by a provider in our office?   If yes, when was it last ordered? Are there any refills on file?   If no, patient should schedule an appointment.   Last Filled:   01/20/2023    (12 mo, 0 refills)     [x]     2.  Review patient's allergies.        [x]   3.  Review last office visit note (where the medication was discussed) and other related encounters.  Last Office Visit   Date: 03/02/2023     [x]   4.  Determine if the patient has a follow up visit scheduled as instructed. Follow Up:  Return in about 6 months (around 08/30/2023) for Medicare wellness, no need to fast.      []   5.  If the patient is overdue for their follow up, please deny the medication and inform the patient to schedule - place a follow up order to send a scheduling ticket to the patient.   If the medication has been denied and the patient has sent a second message requesting the refill without an appointment, please forward to the provider for assistance/approval (consider E-visit).    If the patient is in compliance with the designated follow up schedule, proceed to the next step.   MAW sched 01/2024     []     6.  Review associated labs to ensure they are up to date, if applicable.        []   7. MA: send to the provider for refill.     LPN/RN: send temp refill per protocol (30-90 days), if the protocol applies.    CONTROLLED SUBSTANCES must be sent to the provider for approval.

## 2024-01-07 ENCOUNTER — Ambulatory Visit (INDEPENDENT_AMBULATORY_CARE_PROVIDER_SITE_OTHER): Payer: Self-pay

## 2024-01-07 DIAGNOSIS — I10 Essential (primary) hypertension: Secondary | ICD-10-CM

## 2024-01-07 DIAGNOSIS — E785 Hyperlipidemia, unspecified: Secondary | ICD-10-CM

## 2024-01-26 ENCOUNTER — Encounter (INDEPENDENT_AMBULATORY_CARE_PROVIDER_SITE_OTHER): Payer: Self-pay | Admitting: Internal Medicine

## 2024-01-31 ENCOUNTER — Other Ambulatory Visit (INDEPENDENT_AMBULATORY_CARE_PROVIDER_SITE_OTHER): Payer: Self-pay | Admitting: Internal Medicine

## 2024-02-02 ENCOUNTER — Ambulatory Visit (FREE_STANDING_LABORATORY_FACILITY): Admitting: Internal Medicine

## 2024-02-02 ENCOUNTER — Encounter (INDEPENDENT_AMBULATORY_CARE_PROVIDER_SITE_OTHER): Payer: Self-pay | Admitting: Internal Medicine

## 2024-02-02 VITALS — BP 108/70 | HR 79 | Temp 97.1°F | Resp 18 | Wt 156.0 lb

## 2024-02-02 DIAGNOSIS — L570 Actinic keratosis: Secondary | ICD-10-CM | POA: Insufficient documentation

## 2024-02-02 DIAGNOSIS — D225 Melanocytic nevi of trunk: Secondary | ICD-10-CM | POA: Insufficient documentation

## 2024-02-02 DIAGNOSIS — E538 Deficiency of other specified B group vitamins: Secondary | ICD-10-CM

## 2024-02-02 DIAGNOSIS — I10 Essential (primary) hypertension: Secondary | ICD-10-CM

## 2024-02-02 DIAGNOSIS — Z0000 Encounter for general adult medical examination without abnormal findings: Secondary | ICD-10-CM

## 2024-02-02 DIAGNOSIS — Z780 Asymptomatic menopausal state: Secondary | ICD-10-CM

## 2024-02-02 DIAGNOSIS — M810 Age-related osteoporosis without current pathological fracture: Secondary | ICD-10-CM

## 2024-02-02 LAB — COMPREHENSIVE METABOLIC PANEL
ALT: 23 U/L (ref <=55)
AST (SGOT): 34 U/L (ref <=41)
Albumin/Globulin Ratio: 1.1 (ref 0.9–2.2)
Albumin: 3.8 g/dL (ref 3.5–5.0)
Alkaline Phosphatase: 61 U/L (ref 37–117)
Anion Gap: 7 (ref 5.0–15.0)
BUN: 15 mg/dL (ref 7–21)
Bilirubin, Total: 0.6 mg/dL (ref 0.2–1.2)
CO2: 26 meq/L (ref 17–29)
Calcium: 9.1 mg/dL (ref 7.9–10.2)
Chloride: 108 meq/L (ref 99–111)
Creatinine: 0.9 mg/dL (ref 0.4–1.0)
GFR: >60 mL/min/1.73 m2 (ref >=60.0)
Globulin: 3.5 g/dL (ref 2.0–3.6)
Glucose: 103 mg/dL — ABNORMAL HIGH (ref 70–100)
Hemolysis Index: 5 {index}
Potassium: 4.3 meq/L (ref 3.5–5.3)
Protein, Total: 7.3 g/dL (ref 6.0–8.3)
Sodium: 141 meq/L (ref 135–145)

## 2024-02-02 LAB — LAB USE ONLY - CBC WITH DIFFERENTIAL
Absolute Basophils: 0.05 x10 3/uL (ref 0.00–0.08)
Absolute Eosinophils: 0.14 x10 3/uL (ref 0.00–0.44)
Absolute Immature Granulocytes: 0.02 x10 3/uL (ref 0.00–0.07)
Absolute Lymphocytes: 1.52 x10 3/uL (ref 0.42–3.22)
Absolute Monocytes: 0.39 x10 3/uL (ref 0.21–0.85)
Absolute Neutrophils: 3.2 x10 3/uL (ref 1.10–6.33)
Absolute nRBC: 0 x10 3/uL (ref <=0.00)
Basophils %: 0.9 %
Eosinophils %: 2.6 %
Hematocrit: 42.8 % (ref 34.7–43.7)
Hemoglobin: 13.8 g/dL (ref 11.4–14.8)
Immature Granulocytes %: 0.4 %
Lymphocytes %: 28.6 %
MCH: 29.4 pg (ref 25.1–33.5)
MCHC: 32.2 g/dL (ref 31.5–35.8)
MCV: 91.3 fL (ref 78.0–96.0)
MPV: 10.7 fL (ref 8.9–12.5)
Monocytes %: 7.3 %
Neutrophils %: 60.2 %
Platelet Count: 275 x10 3/uL (ref 142–346)
Preliminary Absolute Neutrophil Count: 3.2 x10 3/uL (ref 1.10–6.33)
RBC: 4.69 x10 6/uL (ref 3.90–5.10)
RDW: 14 % (ref 11–15)
WBC: 5.32 x10 3/uL (ref 3.10–9.50)
nRBC %: 0 /100{WBCs} (ref <=0.0)

## 2024-02-02 LAB — LIPID PANEL
Cholesterol / HDL Ratio: 1.8 {index}
Cholesterol: 126 mg/dL (ref <=199)
HDL: 71 mg/dL (ref >=40)
LDL Calculated: 37 mg/dL (ref 0–99)
Triglycerides: 88 mg/dL (ref 34–149)
VLDL Calculated: 18 mg/dL (ref 10–40)

## 2024-02-02 LAB — VITAMIN B12: Vitamin B-12: 466 pg/mL (ref 211–911)

## 2024-02-02 MED ORDER — RISEDRONATE SODIUM 150 MG PO TABS
150.0000 mg | ORAL_TABLET | ORAL | 3 refills | Status: DC
Start: 2024-02-02 — End: 2024-03-09

## 2024-02-02 MED ORDER — LOSARTAN POTASSIUM 25 MG PO TABS
75.0000 mg | ORAL_TABLET | Freq: Every day | ORAL | 3 refills | Status: AC
Start: 2024-02-02 — End: ?

## 2024-02-02 MED ORDER — ATORVASTATIN CALCIUM 10 MG PO TABS
10.0000 mg | ORAL_TABLET | Freq: Every day | ORAL | 3 refills | Status: AC
Start: 2024-02-02 — End: ?

## 2024-02-02 NOTE — Assessment & Plan Note (Addendum)
 Orders:    Lipid Panel; Future    CBC with Differential (Order); Future    Comprehensive Metabolic Panel; Future

## 2024-02-02 NOTE — Progress Notes (Signed)
 [facility] [address]  Medicare Wellness Visit               Aimee Wade is a 78 y.o. female who presents today for the following Medicare Wellness Visit: Annual Wellness Visit - Subsequent  {  Seeing ortho for right knee.  Fell and broke left foot, wore boot.  Had rash on face after using coconut oil.    Health Risk Assessment   During the past month, how would you rate your general health?:  (Patient-Rptd) (P) Fair  Which of the following tasks can you do without assistance - drive or take the bus alone; shop for groceries or clothes; prepare your own meals; do your own housework/laundry; handle your own finances/pay bills; eat, bathe or get around your home?: (Patient-Rptd) (P) Drive or take the bus alone, Shop for groceries or clothes, Prepare your own meals, Do your own housework/laundry, Handle your own finances/pay bills, Eat, bathe, dress or get around your home  Which of the following problems have you been bothered by in the past month - dizzy when standing up; problems using the phone; feeling tired or fatigued; moderate or severe body pain?: (Patient-Rptd) (P) Feeling tired or fatigued, Moderate or severe body pain  Do you exercise for about 20 minutes 3 or more days per week?:(Patient-Rptd) (P) No  During the past month was someone available to help if you needed and wanted help?  For example, if you felt nervous, lonely, got sick and had to stay in bed, needed someone to talk to, needed help with daily chores or needed help just taking care of yourself.: (Patient-Rptd) (P) Yes  Do you always wear a seat belt?: (Patient-Rptd) (P) Yes  Do you have any trouble taking medications the way you have been told to take them?: (Patient-Rptd) (P) No  Have you been given any information that can help you with keeping track of your medications?: (Patient-Rptd) (P) No  Do you have trouble paying for your medications?: (Patient-Rptd) (P) No  Hospitalizations   Hospitalization within past year:  No    Screenings         01/26/2024 02/02/2024   Ambulatory Screenings   Falls Risk: Terrilee more than 2 times in past year Y    Falls Risk: Suffer any injuries? Y    Depression: PHQ2 Total Score  0   Depression: PHQ9 Total Score  0      Substance Use Disorder Screen:  Jacquiline  reports that she has never smoked. She has never used smokeless tobacco. She reports that she does not currently use alcohol. She reports that she does not use drugs.    Functional Ability/Level of Safety   Falls Risk/Home Safety Assessment:  Have you been given any information that can help you with hazards in your house, such as scatter rugs, furniture, etc?: (Patient-Rptd) (P) No  Do you feel unsteady when standing or walking?: (Patient-Rptd) (P) Yes  Do you worry about falling?: (Patient-Rptd) (P) Yes  Have you fallen two or more times in the past year?: (Patient-Rptd) (P) Yes  Did you suffer any injuries from your falls in the past year?: (Patient-Rptd) (P) Yes    Home Safety:   Skid-resistant rugs/remove throw rugs, Clear pathways between rooms  , and Proper lighting stairs/bathrooms/bedrooms    Hearing Assessment:  hearing within normal limits    Visual Acuity:     If no visual acuity exam above, the patient has declined the visual acuity exam portion  of this encounter.  Up to date with Optometry/Ophthalmology per patient report    Exercise:   No formal exercise    Diet:  Diet: - consumes a well balanced diet    Activities of Daily Living   ADL's  Bathing: Independent  Dressing: Independent  Mobility: Independent  Transfer: Independent  Eating: Independent  Toileting: Independent    IADL's  Phone: Independent  Housekeeping: Independent  Laundry: Independent  Transportation: Independent  Medications: Independent  Finances: Independent    ADL assistance:   No assistance needed    Social Activities/Engagement   Frequency of Communication with Friends and Family:  very often    Frequency of Social Gatherings with Friends and Family:   very  often    Advanced Care Planning   Discussion of Advance Directives:   Has an Scientist, water quality. A copy has not been provided. Requested to provide.  Reports DNR       Exam   BP 108/70 (BP Site: Left arm, Patient Position: Sitting, Cuff Size: Medium)   Pulse 79   Temp 97.1 F (36.2 C) (Temporal)   Resp 18   Wt 70.8 kg (156 lb)   SpO2 97%   BMI 26.78 kg/m   Physical Exam  Constitutional:       General: She is not in acute distress.     Appearance: She is not ill-appearing.   HENT:      Head: Normocephalic.      Right Ear: Tympanic membrane normal.      Left Ear: Tympanic membrane normal.      Mouth/Throat:      Mouth: Mucous membranes are moist.      Pharynx: Oropharynx is clear.   Eyes:      Extraocular Movements: Extraocular movements intact.      Conjunctiva/sclera: Conjunctivae normal.      Pupils: Pupils are equal, round, and reactive to light.   Cardiovascular:      Rate and Rhythm: Normal rate and regular rhythm.      Pulses: Normal pulses.      Heart sounds: Normal heart sounds.   Pulmonary:      Effort: Pulmonary effort is normal.      Breath sounds: Normal breath sounds.   Abdominal:      General: Bowel sounds are normal.      Palpations: Abdomen is soft.      Tenderness: There is no abdominal tenderness. There is no guarding or rebound.   Musculoskeletal:      Cervical back: Neck supple.      Right lower leg: No edema.      Left lower leg: No edema.   Skin:     General: Skin is warm and dry.   Neurological:      Mental Status: She is alert. Mental status is at baseline.       Evaluation of Cognitive Function   Mood/affect: Appropriate  Appearance:  alert, well appearing, and in no distress  Family member/caregiver input: Not present in room    {  Mini-Cog Score:  Denies memory change    Assessment/Plan     Assessment & Plan  Preventative health care         Essential hypertension    Orders:    Lipid Panel; Future    CBC with Differential (Order); Future    Comprehensive Metabolic Panel;  Future    Vitamin B12 deficiency    Orders:    Vitamin B12; Future  Personalized Prevention Plan   The patient was provided with a personalized prevention plan via   the Patient Instructions tab of this visit                                                                                                                                        {  History/Care Team   Patient Care Team:  Zhang, Viona X, MD as PCP - General (Internal Medicine)  Jeannetta Lonell BROCKS, MD as Consulting Physician (Urology)  Kassie Sauer, MD as Consulting Physician (Dermatology)  Medical, surgical, family history reviewed and updated during this encounter  Medication list updated and reconciled during this encounter    Additional Documentation     Hypertension: Recommend more plant based, low salt diet.  Recommend checking home blood pressures.  On losartan  75 mg daily.  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  On atorvastatin  10 mg daily  Vitamin B12 deficiency: Noted on labs.  Recommend over the counter supplement  Osteoporosis: Recommend dietary calcium  intake, vitamin D, weight bearing exercises, minimize alcohol.  On risedronate  150 mg monthly.  Consider DEXA every 2 years.  May follow up with endocrinology in the future    Return in about 1 year (around 02/01/2025) for Medicare wellness.    Verbal consent obtained to record this visit when ambient technology is utilized.

## 2024-02-02 NOTE — Progress Notes (Signed)
 Health Maintenance Due   Topic Date Due    Advance Directive on File  Never done    Influenza Vaccine  01/28/2024

## 2024-02-02 NOTE — Assessment & Plan Note (Addendum)
 Orders:    Vitamin B12; Future

## 2024-02-11 ENCOUNTER — Ambulatory Visit (INDEPENDENT_AMBULATORY_CARE_PROVIDER_SITE_OTHER): Payer: Self-pay

## 2024-02-11 ENCOUNTER — Ambulatory Visit (INDEPENDENT_AMBULATORY_CARE_PROVIDER_SITE_OTHER): Payer: Self-pay | Admitting: Internal Medicine

## 2024-02-11 DIAGNOSIS — I10 Essential (primary) hypertension: Secondary | ICD-10-CM

## 2024-02-11 DIAGNOSIS — E785 Hyperlipidemia, unspecified: Secondary | ICD-10-CM

## 2024-03-01 ENCOUNTER — Encounter (INDEPENDENT_AMBULATORY_CARE_PROVIDER_SITE_OTHER): Payer: Self-pay | Admitting: Internal Medicine

## 2024-03-01 NOTE — Patient Instructions (Signed)
 MEDICARE WELLNESS PERSONAL PREVENTION PLAN   As part of the Medicare Wellness portion of your visit today, we are providing you with this personalized preventative plan of care. The list below includes many common screening recommendations from the USPSTF ([place]  Preventive Services Task Force) but is not meant to be comprehensive. You may be eligible for other preventative services depending upon your personal risk factors.     Health Maintenance   Topic Date Due    Advance Directive on File  Never done    Influenza Vaccine  01/28/2024    FALLS RISK ANNUAL  01/25/2025    DEPRESSION SCREENING  02/01/2025    Statin Use  02/01/2025    Medicare Annual Wellness Visit  02/01/2025    Colonoscopy  04/09/2027    Tetanus Ten-Year  06/30/2027    HEPATITIS C SCREENING  Completed    DXA Scan  Completed    Shingrix Vaccine 50+  Completed    Pneumonia Vaccine Age 76 Years and Older  Completed    COVID-19 Vaccine  Discontinued     Health Maintenance Topics with due status: Overdue       Topic Date Due    Advance Directive on File Never done    Influenza Vaccine 01/28/2024      Immunization History   Administered Date(s) Administered    COVID-19 mRNA BIVALENT vaccine 12 years and above [employer]) 30 mcg/0.3 mL 03/26/2021    COVID-19 mRNA MONOVALENT vaccine PRIMARY SERIES 12 years and above [employer]) 30 mcg/0.3 mL (DO NOT DILUTE) 10/18/2020    COVID-19 mRNA seasonal vaccine 12 years and above [employer]) 30 mcg/0.3 mL 03/24/2022, 09/21/2022, 03/12/2023    Influenza quadrivalent high-dose (FLUZONE HIGH-DOSE) 65 years and older, 0.7 mL, preservative free 03/03/2021, 02/27/2022    Influenza trivalent high-dose (FLUZONE HIGH-DOSE), 65 years and older, preservative free 03/07/2023    Pneumococcal conjugate (PREVNAR 20) 20-valent, preservative free 03/31/2022    RSV vaccine (ABRYSVO), bivalent, RSVpreF A&B, diluent reconstituted, PF, 0.5 mL 02/27/2022, 03/11/2022    Zoster (ZOSTAVAX) vaccine, live 04/20/2017, 06/27/2017        Your major  risk factors: Hypertension, Osteoporosis, and Falls Risk  Recommendations for improvement:  Elevated Cholesterol/Low Salt Diet - Optimize adherence with low cholesterol diet.  Here is a good reference website for tips on healthy eating and ways to lower your cholesterol -  (NoCareers.gl)  Exercise - Engage in age appropriate exercise 150 minutes per week   Osteoporosis - Weight bearing exercises (walking, yoga, Tai-Chi) 150 minutes per week.  Here is a good reference for tips on engaging in weight bearing exercise - https://www.bones.ToysManual.co.za  Hypertension - Monitor your blood pressure daily. Here is a good reference website for tips on managing hypertension - MotivationalSites.no  Falls Prevention - Here is a good reference website for tips on avoiding falls - BikingRewards.pl    Colorectal Cancer Screening - All adults 45-75 yrs should undergo periodic colorectal cancer screening. The decision to screen for colorectal cancer in adults aged 85 to 61 years should be an individual one, taking into account your overall health and prior screening history.   Breast Cancer Screening - Women aged 40-85yrs should have mammograms every other year (please note that this recommendation may not be appropriate for every woman - your physician can answer specific questions you may have). The USPSTF concludes that the current evidence is insufficient to assess the balance of benefits and harms of screening mammography in women aged 47 years or older.  These recommendations do  not apply to persons who have a genetic marker or syndrome associated with a high risk of breast cancer (eg, BRCA1 or BRCA2 genetic variation), a history of high-dose radiation therapy to the chest at a young age, or previous breast cancer or a high-risk breast lesion on  previous biopsies.   Cervical Cancer Screening - Women over 38 do not require pap smears as long as prior screening has been normal and are not otherwise at high risk for cervical cancer. The USPSTF recommends screening for cervical cancer every 3 years with cervical cytology alone in women aged 19 to 79 years. For women aged 69 to 28 years, the USPSTF recommends screening every 3 years with cervical cytology alone, every 5 years with high-risk human papillomavirus (hrHPV) testing alone, or every 5 years with hrHPV testing in combination with cytology (cotesting).   Osteoporosis Screening -  The USPSTF recommends screening for osteoporosis with bone measurement testing to prevent osteoporotic fractures in women 65 years and older.  For postmenopausal women younger than 65 years who are at increased risk of osteoporosis, as determined by a formal clinical risk assessment tool, the USPSTF recommends screening for osteoporosis with bone measurement testing to prevent osteoporotic fractures.   Hepatitis C Screening - Recommend screening for hepatitis C virus (HCV) infection in all adults aged 53 to 42 years.  Lung cancer Screening - Recommend annual screening for lung cancer with low-dose computed tomography (LDCT) in adults ages 41 to 23 years who have a 20 pack-year smoking history and currently smoke or have quit within the past 15 years.  Recommended Vaccinations from the CDC ([place]  Centers for Disease Control and Prevention)   Influenza one dose annually   COVID vaccine - stay current with the most up-to-date COVID update/booster  Tetanus/diphtheria (Tdap) one booster every 10 years   Zoster/Shingles - (Shingrix) two doses after age 61 (second dose given 2-6 months after first dose)  Pneumococcal 20-valent or 21-valent conjugate vaccine - one dose for adults aged >=65 years with no history of prior pneumococcal vaccination.  Pneumococcal 20-valent or 21-valent conjugate vaccine -  one dose for adults aged >=65  years with PPSV23 only or PCV13 only given more than 1 yr ago.  Pneumococcal 20-valent or 21-valent conjugate vaccine - one dose for adults aged >=65 years who have completed PCV-13 and PPSV23 after 76yrs old and it has been greater than 10yrs (shared clinical decision-making is recommended regarding administration of this vaccine).   RSV (Respiratory Syncytial Virus) vaccine for everyone ages 34 and older  RSV (Respiratory Syncytial Virus)  vaccine ages 41-74 who are at increased risk of severe RSV disease (for example, chronic heart/lung/liver/kidney disease, immunocompromised)    PERSONAL PREVENTION PLAN   Your Personal Prevention Plan is based on your overall health and your responses to the health questionnaire you completed. The following information is for you to review in addition to the recommendations, referrals, and tests we have discussed at your visit.     Physical Activity:   Physical activity can help you maintain a healthy weight, prevent or control illness, reduce stress, and sleep better. It can also help you improve your balance to avoid falls. Try to build up to and maintain a total of 30 minutes of activity each day. If you are able, try walking, doing yard or housework, and taking the stairs more often. You can also strengthen your muscles with exercises done while sitting or lying down. Web resource - https://www.carpenter-henry.info/  Emotional Health:  Feeling "down in the dumps" or anxious every now and then is a natural part of life. If this feeling lasts for a few weeks or more, talk with me as soon as possible. It could be a sign of a problem that needs treatment. There are many types of treatment available. Web resource -  RXPreview.de  Falls:   You can reduce your risk of falling by making changes in your home. Remove items that may cause tripping, improve lighting, and consider installing grab bars.   Talk with me if you have problems with balance and  walking. To prevent falls, you may need your vision, hearing, or blood pressure checked. Exercises to improve your strength and balance, or using a cane or walker, may help. Review your medicines with me at every visit, because some can affect balance. Please be sure to let me know if you fall or are fearful you may fall. Web resource - BounceThru.fi  Urinary Leakage:   Urine leakage is common, but it is not a normal part of aging. Talk with me about any urine leakage so that the cause can be found and treated. Treatment can include bladder training, exercises, medicine or surgery.   Pain:   We all have aches and pains at times, but chronic pain can change how you feel and live every day. Please talk with me about any symptoms of chronic pain so that we can determine how best to treat.   Sleep:   Getting a good night's sleep is vital to your health and well-being and can help prevent or manage health problems. Often, sleep can be improved by changing behaviors, including when you go to bed and what you do before bed. Sleep apnea can cause problems such as struggling to stay awake during the day. Please let me know if you would like to learn more about improving your sleep and/or think you may have sleep apnea. Web resource -  ThousandQuestions.com.cy  Seat Belt:   Please remember to wear a seat belt when driving or riding in a vehicle. It is one of the most important things you can do to stay safe in a car.   Nutrition:   Remember to eat plenty of fruits, vegetables, whole grains, and dairy. Drink at least 64 ounces (8 full glasses) of water a day, unless you have been advised to limit fluids.  Web resource- PickSeat.dk  Alcohol:   Alcohol can have a greater effect on older people, who may feel its effects at a lower amount. Older people should limit alcoholic drinks (no more than one a day for women and no more than two  a day for men). Please let me know if alcohol use becomes a problem.  Web resource - AgingMortgage.ca  Tobacco:   Not smoking or using other forms of tobacco is one of the most important things you can do for your health. Here is some more information about the importance of quitting smoking and how to quit smoking - Mudlogger - BroadJournal.com.pt  Advance Directives:   There may come a time when medical decisions need to be made on your behalf. Please talk with your family, and with me, about your wishes. It is important to provide information about your decisions, and any formal advance directives, for your medical record. Here is additional information on advanced directives - Web resource - MediaExhibitions.no  Additional Support:   Sometimes it can be challenging to manage all aspects of daily life. Finding the right support  can help you maintain or improve your health and independence. Please let me know if you would like to talk further about finding resources to assist you.

## 2024-03-09 ENCOUNTER — Other Ambulatory Visit (INDEPENDENT_AMBULATORY_CARE_PROVIDER_SITE_OTHER): Payer: Self-pay | Admitting: Internal Medicine

## 2024-03-10 MED ORDER — RISEDRONATE SODIUM 150 MG PO TABS
150.0000 mg | ORAL_TABLET | ORAL | 3 refills | Status: AC
Start: 2024-03-10 — End: ?

## 2024-03-10 NOTE — Telephone Encounter (Signed)
 Prescription Refill Checklist    Requested Prescriptions     Pending Prescriptions Disp Refills    risedronate  (ACTONEL ) 150 MG tablet 12 tablet 3     Sig: Take 1 tablet (150 mg) by mouth every 30 (thirty) days          Done Process Reviewed Notes     [x]   Verify medication in patient's chart.  Has the medication been previously prescribed by a provider in our office?   If yes, when was it last ordered? Are there any refills on file?   If no, patient should schedule an appointment.   Last Filled: 02/02/24 - 12 with 3     [x]     2.  Review patient's allergies.        [x]   3.  Review last office visit note (where the medication was discussed) and other related encounters.  Last Office Visit   Date: 02/02/24     [x]   4.  Determine if the patient has a follow up visit scheduled as instructed. Follow Up: Return in about 1 year (around 02/01/2025) for Medicare wellness.      [x]   5.  If the patient is overdue for their follow up, please deny the medication and inform the patient to schedule - place a follow up order to send a scheduling ticket to the patient.   If the medication has been denied and the patient has sent a second message requesting the refill without an appointment, please forward to the provider for assistance/approval (consider E-visit).    If the patient is in compliance with the designated follow up schedule, proceed to the next step.   02/02/25     []     6.  Review associated labs to ensure they are up to date, if applicable.        []   7. MA: send to the provider for refill.     LPN/RN: send temp refill per protocol (30-90 days), if the protocol applies.    CONTROLLED SUBSTANCES must be sent to the provider for approval.

## 2024-03-14 ENCOUNTER — Encounter (INDEPENDENT_AMBULATORY_CARE_PROVIDER_SITE_OTHER): Payer: Self-pay | Admitting: Nurse Practitioner

## 2024-03-14 ENCOUNTER — Ambulatory Visit (FREE_STANDING_LABORATORY_FACILITY): Admitting: Nurse Practitioner

## 2024-03-14 VITALS — BP 136/85 | HR 76 | Temp 97.7°F | Ht 65.35 in | Wt 166.4 lb

## 2024-03-14 DIAGNOSIS — R350 Frequency of micturition: Secondary | ICD-10-CM

## 2024-03-14 DIAGNOSIS — Z905 Acquired absence of kidney: Secondary | ICD-10-CM

## 2024-03-14 DIAGNOSIS — N390 Urinary tract infection, site not specified: Secondary | ICD-10-CM

## 2024-03-14 DIAGNOSIS — D48118 Desmoid tumor of other site: Secondary | ICD-10-CM

## 2024-03-14 DIAGNOSIS — N3946 Mixed incontinence: Secondary | ICD-10-CM

## 2024-03-14 DIAGNOSIS — N393 Stress incontinence (female) (male): Secondary | ICD-10-CM

## 2024-03-14 LAB — URINALYSIS POCT
Urine Bilirubin POCT: NEGATIVE
Urine Glucose POCT: NEGATIVE mg/dL
Urine Ketones POCT: NEGATIVE mg/dL
Urine Nitrites POCT: NEGATIVE
Urine Protein POCT: NEGATIVE mg/dL
Urine Specific Gravity POCT: 1.02
Urine Urobilinogen POCT: 0.2 mg/dL (ref 0.2–2.0)
Urine pH POCT: 5.5 (ref 5.0–8.0)

## 2024-03-14 NOTE — Progress Notes (Signed)
 Subjective:      Patient ID: Aimee Wade is a 78 y.o. female     Chief Complaint:  1. H/O left nephrectomy    2. SUI (stress urinary incontinence, female)    3. Recurrent UTI    4. Desmoid tumor of abdomen    5. Mixed urge and stress incontinence    6. Acquired solitary kidney    7. Urinary frequency        01/19/2022 Dr. 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  Estrace  vaginal cream uses twice a month due to discomfort after, she reported that she has bothersome urinary symptoms when she uses the cream  Pelvic PT- she did not started, has problems with scheduling and insurance.She does kegels by herself and positional voiding and it helps with urinary incontinence.  RUS 01/29/2022: solitary right kidney, no hydronephrosis, PVR 81 cc  Today: no dysuria, no bladder pain, no pelvic pain, no urgency and frequency and no fever.  She is asymptomatic. She still has Bactrim  Dr. Jeannetta gave her just in case she develops symptoms.  Nocturia Q2 hours. Bedtime at 9:30 pm, last liquid at 8 pm water  She had colonoscopy last week- constipation on and off, last BM today  She snores     She reported that she uses scented and with lotion toilet paper for  years        07/28/2022  She is here for 3 months follow up. No uti since last visit.  Takes daily D-Mannose, Probiotic, and cranberry pills  Nocturia Q2 hours, bedtime at 9 -10 pm, last liquid 7:30 pm( water)  She did not have Sleep study   Today: no dysuria, no hematuria, but she Reported bladder fullness/ pain and fatigue  She still leaks urine with cough, sneeze-never scheduled pelvic PT  She wears diapers 3x daily  Sometimes constipated- BM daily( sometimes she has to strain)  She stop Estrace  vaginal cream 2 months ago due to vaginal itching and burning         10/27/2022  Here for 3 months follow up.  Takes daily D-Mannose, Probiotic, and cranberry pills  She reported 3 UTI's since last visit.  Last UTI was few weeks ago, pt did not see provider and urine sample was not done. She double d-mannose and symptoms resolved.  Last week she had tooth infection and was started on Amoxicillin, she still takes abx. She denies UTI symptoms.     No constipation      01/26/2023  Aimee Wade presents today for 6 months follow up. She had 2 UTI's since last visit. In May ucx shows E.coli  and on 01/08/2023 ucx shows 1-9 K Gram negative rods. In July pt was started on Macrobid  and symptoms got worse, then pt self tx herself with Bactrim  (which she had just in case of emergency) All symptoms resolved.     She takes D-mannose, Probiotics and Cranberry daily      Today pt is asymptomatic  Pt experiences  constipation sometimes.     07/29/2023  Aimee Wade presents today for follow up.She has hx of recurrent UTI's.Status post left nephrectomy 2/2 desmoid tumor.      Since the last visit pt had 1 UTI in January 2025, which shows E.coli. UTI symptoms at that time: dysuria and urinary frequency. Pt was tx with keflex . Pt had diarrhea in the pants few days before  UTI started.  Pt completed abx few days ago. Since early morning pt has dysuria, bladder pain, frequency and urgency.  Pt takes daily D-mannose, Cranberry, and Probiotics  RUS  02/07/2023: Normal right kidney. No hydronephrosis. No solid renal mass. No sonographic evidence of renal calculi.PVR 15 ml  Pt leaks urine with coughing and sneezing.She wears 2 depends daily. Pt tried pelvic PT, with not much improvement.        09/09/2023  This is a 78 years old female with hx of recurrent UTI's, status post left nephrectomy 2/2 desmoid tumor. She presents today for follow up.  Asymptomatic today. No dysuria/no hematuria/no constipation  RUS 01/2023: normal  Urinary incontinence is getting better: SUI and occasional urge incontinence. I diaper during the day and 1 at night.  Pt takes daily D-mannose, Cranberry, and Probiotics  2 weeks ago pt had UTI symptoms: pt went to urgent care, ucx was negative, symptoms resolved with no tx    TODAY 03/14/2024    History of Present Illness  Aimee Wade is a 78 year old female with recurrent urinary tract infections and a history of left nephrectomy who presents for a routine follow-up.    She has not had any urinary tract infections since March. Two weeks ago, she experienced pressure and mild burning, which resolved with Azo. Currently, she has no urinary symptoms, however reported fatigue. She continues to take D-mannose, cranberry, and probiotics but has discontinued Estrace  vaginal cream.    She experiences urinary incontinence, leaking with sneezing, coughing, or standing, and uses three diapers daily. Pelvic physical therapy was ineffective. She sometimes feels incomplete bladder emptying and urgency, especially after prolonged holding.    She underwent left nephrectomy for a desmoid tumor involving the ureter. She had a cystoscopy in 2021.      The following portions of the patient's history were reviewed and updated as appropriate: allergies, current medications, past family history, past medical history, past social history, past surgical history and problem list.    Review of Systems  As per HPI      Objective:   BP 136/85 (BP Site: Left arm,  Patient Position: Sitting, Cuff Size: Medium)   Pulse 76   Temp 97.7 F (36.5 C) (Oral)   Ht 1.66 m (5' 5.35)   Wt 75.5 kg (166 lb 6.4 oz)   SpO2 94%   BMI 27.39 kg/m   General appearance - alert, well appearing, and in no distress  Mental status - alert, oriented to person, place, and time, appropriate affect       PVR 8 ml    Lab Review      Component  Ref Range & Units (hover) 10:06  (03/14/24) 6 mo ago  (  09/09/23) 6 mo ago  (08/25/23) 7 mo ago  (07/29/23) 7 mo ago  (07/19/23) 1 yr ago  (01/26/23) 1 yr ago  (01/08/23)     Urine Color POCT Dark yellow Yellow  Yellow  Yellow     Urine Clarity POCT Clear Slightly Cloudy Abnormal   Cloudy Abnormal   Clear     Urine pH POCT 5.5 5.5  6.0  5.5     Urine Leukocyte Esterase POCT Moderate Abnormal  Negative  Large Abnormal   Negative     Urine Nitrites POCT Negative Negative  Negative  Negative     Urine Protein POCT Negative Negative  100 Abnormal   Negative     Urine Glucose POCT Negative Negative  Negative  Negative     Urine Ketones POCT Negative Negative  Negative  Negative     Urine Urobilinogen POCT 0.2 0.2  0.2  0.2     Urine Bilirubin POCT Negative Negative  Negative  Negative     Urine Blood POCT T r-intact Abnormal  Negative 3+ (200 Ery/ul) Abnormal  Large Abnormal  1+ (25 Ery/ul) Abnormal  Negative 3+ (200 Ery/ul) Abnormal     Urine Specific Gravity POCT 1.020             Radiology Review     US  Renal Kidney Bladder Complete    Narrative  HISTORY: Recurrent UTI. Status post left nephrectomy.    COMPARISON: 01/29/2022    FINDINGS:  Renal Measurements (long axis):  Right kidney: 11.4 cm  Left kidney is absent    Right kidney: Parenchymal echogenicity and thickness are normal. No  hydronephrosis. No solid renal mass. No sonographic evidence of renal  calculi.    Bladder: The bladder is imaged at a volume of 394 ml. The wall thickness is  normal for the degree of distention. No mass, calculi, or diverticulum is  detected. Post-void residual 15  ml.    Impression  1. Normal right kidney.  2. Normal urinary bladder with 13 cc post void residual.    Lamarr Bellini, MD  02/07/2023 10:56 AM         Assessment:     1. H/O left nephrectomy    2. SUI (stress urinary incontinence, female)    3. Recurrent UTI    4. Desmoid tumor of abdomen    5. Mixed urge and stress incontinence    6. Acquired solitary kidney    7. Urinary frequency      Assessment & Plan  Mixed urinary incontinence (stress and urgency)  Mixed urinary incontinence characterized by stress incontinence (leakage with sneezing, coughing, or physical exertion) and urgency incontinence (sudden urge to urinate with inability to reach the bathroom in time). Previous pelvic physical therapy was ineffective. She prefers non-medication options due to potential side effects of medication (constipation, dry mouth, dry eyes, increased blood pressure).  - Schedule PTNS sessions (12 sessions, weekly for 30 minutes each)       Recurrent urinary tract infection,    Recurrent urinary tract infections, status post left nephrectomy for desmoid tumor. Recent symptoms of burning and bladder discomfort, but currently fatigue.     - Send urine culture  - Monitor symptoms and contact her if culture is positive  - Continue daily D-mannose, probiotics, and cranberry pills  - Encourage urination every 2-3 hours and double voiding before bed           Management and care for this patient was reviewed with the supervising physician.  Plan:     Patient Instructions   Timed voiding: urinate every 2-3 hours during the day  Double voiding: urinate then 5 minutes after urinate again during the day  Positional voiding (sit straight up at the end of voiding then lean forward to help empty your bladder)   Continue daily D-mannose, Cranberry and probiotic  Urine culture was ordered.  6. We will schedule PTNS    We reviewed the strategy called bladder acupuncture which is also called PTNS, or urgent PC.   - Details about this  are included below    Follow-up to start the first of 12 weekly induction sessions at the next available time      Percutaneous tibial nerve stimulation (PTNS) - Urgent PC    This therapy stimulates the tibial nerve. For this type of neuromodulation you will not have to have surgery. PTNS is performed during an office visit that takes about 30 minutes. Your health care provider places a needle electrode near your ankle. It sends electrical pulses to the tibial nerve which runs along your knee to the sacral nerves. The electrical pulses help block the nerve signals that aren't working correctly. Often, patients receive 12 weekly treatments, depending on how well they are doing.              Orders  Orders Placed This Encounter   Procedures    Culture, Urine     Standing Status:   Future     Expected Date:   03/14/2024     Expiration Date:   03/14/2025     Release to patient:   Immediate    Bladder scan

## 2024-03-14 NOTE — Patient Instructions (Signed)
 Timed voiding: urinate every 2-3 hours during the day  Double voiding: urinate then 5 minutes after urinate again during the day  Positional voiding (sit straight up at the end of voiding then lean forward to help empty your bladder)   Continue daily D-mannose, Cranberry and probiotic  Urine culture was ordered.  6. We will schedule PTNS    We reviewed the strategy called bladder acupuncture which is also called PTNS, or urgent PC.   - Details about this are included below    Follow-up to start the first of 12 weekly induction sessions at the next available time      Percutaneous tibial nerve stimulation (PTNS) - Urgent PC    This therapy stimulates the tibial nerve. For this type of neuromodulation you will not have to have surgery. PTNS is performed during an office visit that takes about 30 minutes. Your health care provider places a needle electrode near your ankle. It sends electrical pulses to the tibial nerve which runs along your knee to the sacral nerves. The electrical pulses help block the nerve signals that aren't working correctly. Often, patients receive 12 weekly treatments, depending on how well they are doing.

## 2024-03-16 ENCOUNTER — Ambulatory Visit (INDEPENDENT_AMBULATORY_CARE_PROVIDER_SITE_OTHER): Payer: Self-pay

## 2024-03-16 LAB — CULTURE, URINE
Culture Urine: >100000 — AB
Culture Urine: NORMAL

## 2024-03-16 MED ORDER — CEFDINIR 300 MG PO CAPS
300.0000 mg | ORAL_CAPSULE | Freq: Two times a day (BID) | ORAL | 0 refills | Status: AC
Start: 2024-03-16 — End: 2024-03-23

## 2024-04-13 ENCOUNTER — Ambulatory Visit (FREE_STANDING_LABORATORY_FACILITY): Admitting: Physician Assistant

## 2024-04-13 ENCOUNTER — Encounter (INDEPENDENT_AMBULATORY_CARE_PROVIDER_SITE_OTHER): Payer: Self-pay | Admitting: Physician Assistant

## 2024-04-13 VITALS — BP 144/83 | HR 84 | Temp 97.2°F | Resp 16 | Ht 65.0 in | Wt 163.0 lb

## 2024-04-13 DIAGNOSIS — N3000 Acute cystitis without hematuria: Secondary | ICD-10-CM

## 2024-04-13 LAB — MCKESSON POCT UA 120
Bilirubin UA: NEGATIVE
Blood UA: NEGATIVE
Glucose UA: NEGATIVE
Ketone UA: NEGATIVE
Nitrite UA: NEGATIVE
Protein UA: NEGATIVE
Specific Gravity UA: 1.01
Urobilinogen UA: NEGATIVE
pH UA: 6

## 2024-04-13 MED ORDER — CEFDINIR 300 MG PO CAPS
300.0000 mg | ORAL_CAPSULE | Freq: Two times a day (BID) | ORAL | 0 refills | Status: AC
Start: 2024-04-13 — End: 2024-04-20

## 2024-04-13 NOTE — Patient Instructions (Signed)
Take the Cefdinir twice a day with food and a full glass of water.  Drink plenty of fluids.   If you experience fevers, nausea, back pain, or worsening symptoms, please go to the emergency room.

## 2024-04-15 ENCOUNTER — Ambulatory Visit (INDEPENDENT_AMBULATORY_CARE_PROVIDER_SITE_OTHER): Payer: Self-pay | Admitting: Physician Assistant

## 2024-04-15 LAB — CULTURE, URINE: Culture Urine: >100000 — AB

## 2024-04-17 ENCOUNTER — Encounter (INDEPENDENT_AMBULATORY_CARE_PROVIDER_SITE_OTHER): Payer: Self-pay | Admitting: Physician Assistant

## 2024-04-17 NOTE — Progress Notes (Signed)
 [facility]  OFFICE NOTE         Subjective   Historian: Patient      Chief Complaint   Patient presents with    Dysuria     Pt c/o urinary urgency, burning sensation starting Friday. OTC azo     HPI  Aimee Wade is a 78 y.o. female who presents for urgency, increased frequency, and discomfort of urination.  She is taken Azo, but due to continued discomfort she is coming for further assistance and evaluation denies any other symptoms at this time.    History:  Medications and Allergies reviewed.   Pertinent Past Medical, Surgical, Family and Social History were reviewed.          Objective   BP 144/83 (BP Site: Left arm, Patient Position: Sitting, Cuff Size: Medium)   Pulse 84   Temp 97.2 F (36.2 C) (Tympanic)   Resp 16   Ht 1.651 m (5' 5)   Wt 73.9 kg (163 lb)   SpO2 94%   BMI 27.12 kg/m   Physical Exam Vitals and nursing note reviewed.   Constitutional:       General: Not in acute distress.     Appearance: Normal appearance. Not ill-appearing or toxic-appearing.   HENT:      Head: Normocephalic and atraumatic.   Eyes:      Conjunctiva/sclera: Conjunctivae normal.   Neck:      Musculoskeletal: Normal range of motion.   Respiratory:      Normal effort. Able to speak in full sentences.  Neurological:      Mental Status: Alert and oriented.  Psychiatric:         Mood and Affect: Mood normal.         Behavior: Behavior normal.      Urgent Care Course   There were no labs reviewed with this patient during the visit.    There were no x-rays reviewed with this patient during the visit.      Procedures   Procedures     Assessment / Plan     Differential Diagnoses including but not limited to: pyelonephritis, nephrolithiasis, urethritis, vaginitis, PID, cystitis      Treating for cystitis, vital signs are stable discharge to home    Aimee Wade was seen today for dysuria.    Diagnoses and all orders for this visit:    Acute cystitis without hematuria  -     [employer] POCT UA; Future  -     [employer] POCT  UA  -     Culture, Urine; Future  -     Culture, Urine    Other orders  -     cefdinir  (OMNICEF ) 300 MG capsule; Take 1 capsule (300 mg) by mouth 2 (two) times daily for 7 days         The indications for early follow-up with PCP and return to UC were discussed. Patient/family received education on the working diagnosis, diagnostic uncertainties, and proposed treatment plan. Indications for emergency evaluation in the ED were reviewed. Written and verbal discharge instructions were provided and discussed and all questions from the patient/family were addressed, with no apparent barriers.

## 2024-04-21 ENCOUNTER — Telehealth (INDEPENDENT_AMBULATORY_CARE_PROVIDER_SITE_OTHER): Payer: Self-pay | Admitting: Internal Medicine

## 2024-04-21 ENCOUNTER — Other Ambulatory Visit (INDEPENDENT_AMBULATORY_CARE_PROVIDER_SITE_OTHER): Payer: Self-pay | Admitting: Internal Medicine

## 2024-04-21 NOTE — Telephone Encounter (Signed)
 Last filled: 8/62025, 90 tablet, 3 refills  Refills available at pharmacy.

## 2024-04-21 NOTE — Telephone Encounter (Signed)
 Spoke with Selinda a pharmacy and they see the RX sent on 02/02/24 and are working to fill it.

## 2024-04-21 NOTE — Telephone Encounter (Signed)
 Copied from CRM [id number]. Topic: Clinical Support - Speak With Nurse  >> Apr 21, 2024 11:40 AM Alfonso MATSU wrote:  Aimee Wade called about Clinical Support - Speak With Nurse.  Additional details:  Pt states on the pill bottle of the medication atorvastatin  (LIPITOR) 10 MG tabletshow dispensed on 01/23/2024 90 day supply with zero refills. I informed pt shows meds sent on 02/02/2024 for 90 days and three refills. Pt request call back. Pt only has three pills left.. Please advise

## 2024-05-01 ENCOUNTER — Other Ambulatory Visit (INDEPENDENT_AMBULATORY_CARE_PROVIDER_SITE_OTHER): Payer: Self-pay | Admitting: Internal Medicine

## 2024-05-06 ENCOUNTER — Encounter (INDEPENDENT_AMBULATORY_CARE_PROVIDER_SITE_OTHER): Payer: Self-pay

## 2024-05-10 ENCOUNTER — Ambulatory Visit (INDEPENDENT_AMBULATORY_CARE_PROVIDER_SITE_OTHER): Payer: Self-pay

## 2024-05-10 DIAGNOSIS — E785 Hyperlipidemia, unspecified: Secondary | ICD-10-CM

## 2024-05-10 DIAGNOSIS — I10 Essential (primary) hypertension: Secondary | ICD-10-CM

## 2024-05-31 ENCOUNTER — Encounter (INDEPENDENT_AMBULATORY_CARE_PROVIDER_SITE_OTHER): Payer: Self-pay

## 2024-06-05 ENCOUNTER — Encounter (INDEPENDENT_AMBULATORY_CARE_PROVIDER_SITE_OTHER): Payer: Self-pay

## 2024-06-14 ENCOUNTER — Encounter (INDEPENDENT_AMBULATORY_CARE_PROVIDER_SITE_OTHER): Payer: Self-pay

## 2024-06-14 ENCOUNTER — Ambulatory Visit (FREE_STANDING_LABORATORY_FACILITY)

## 2024-06-14 VITALS — BP 156/88 | HR 75 | Temp 97.8°F | Resp 18 | Ht 65.0 in | Wt 162.0 lb

## 2024-06-14 DIAGNOSIS — N3091 Cystitis, unspecified with hematuria: Secondary | ICD-10-CM

## 2024-06-14 DIAGNOSIS — R300 Dysuria: Secondary | ICD-10-CM

## 2024-06-14 LAB — MCKESSON POCT UA 120
Bilirubin UA: NEGATIVE
Glucose UA: NEGATIVE
Ketone UA: NEGATIVE
Nitrite UA: NEGATIVE
Protein UA: NEGATIVE
Specific Gravity UA: <=1.005
Urobilinogen UA: NEGATIVE
pH UA: 6

## 2024-06-14 MED ORDER — CEFDINIR 300 MG PO CAPS: 300.0000 mg | ORAL_CAPSULE | Freq: Two times a day (BID) | ORAL | 0 refills | Status: AC

## 2024-06-14 NOTE — Patient Instructions (Signed)
 Your symptoms today are consistent with a urinary tract infection. Start the antibiotic prescribed and take it as directed until the course is completely finished. Drink plenty of water to help your body clear the infection.  Avoid caffeine, alcohol,  while you are healing. If you develop fever, chills, flank pain, nausea, vomiting, or if your symptoms are not improving in two to three days, return to urgent care or see your primary care provider. If your urine culture shows anything different, we will contact you.

## 2024-06-14 NOTE — Progress Notes (Signed)
 Aimee Wade  OFFICE NOTE         Subjective   Historian: Patient      Chief Complaint   Patient presents with    Urinary Tract Infection Symptoms     Pt c/o urgency, burning and lower abdominal pressure voiding X 10 hr. She took Azo       Urinary Tract Infection Symptoms       Aimee Wade is a 78 y.o. female who presents with urinary urgency and frequency started last night.   Reports heaviness and pressure on suprapubic area.    Denies fever, back pain , nausea, or vomiting.     History:  Medications and Allergies reviewed.   Pertinent Past Medical, Surgical, Family and Social History were reviewed.          Objective     Vitals:    06/14/24 1050   BP: 156/88   BP Site: Left arm   Patient Position: Sitting   Cuff Size: Medium   Pulse: 75   Resp: 18   Temp: 97.8 F (36.6 C)   TempSrc: Oral   SpO2: 96%   Weight: 73.5 kg (162 lb)   Height: 1.651 m (5' 5)      Body mass index is 26.96 kg/m.          Physical Exam  Constitutional:       Appearance: Normal appearance.   Cardiovascular:      Rate and Rhythm: Normal rate and regular rhythm.   Pulmonary:      Effort: Pulmonary effort is normal.      Breath sounds: Normal breath sounds.   Abdominal:      General: Abdomen is flat. Bowel sounds are normal. There is no distension.      Palpations: Abdomen is soft. There is no mass.      Tenderness: There is no abdominal tenderness. There is no right CVA tenderness, left CVA tenderness, guarding or rebound.      Hernia: A hernia is present.   Neurological:      Mental Status: She is alert and oriented to person, place, and time.   Skin:     General: Skin is warm and dry.   Psychiatric:         Behavior: Behavior normal.   Vitals and nursing note reviewed.     Urgent Wade Course   LABS  The following POCT tests were ordered, reviewed and discussed with the patient/family.     Results for orders placed or performed in visit on 06/14/24 (from the past 24 hours)   [employer] POCT UA    Collection Time: 06/14/24 11:11  AM   Result Value    Color, UA Yellow    Clarity, UA Turbid    Leukocytes UA 3+ (500 Leu/ul) (A)    Nitrite UA Negative    Urobilinogen UA Negative (0.2 mg/dl)    Protein UA Negative    pH UA 6.0    Blood UA 2+ (80 Ery/ul) (A)    Specific Gravity UA <=1.005    Ketone UA Negative    Bilirubin UA Negative    Glucose UA Negative     There were no x-rays reviewed with this patient during the visit.      Procedures   Procedures     Assessment / Plan     Differential Diagnoses including but not limited to:   Acute uncomplicated urinary tract infection   Early pyelonephritis   Urethritis  Kidney stone (if flank pain or blood in urine)     Plan  Urine dip and/or culture obtained. Symptoms and urinalysis findings are most consistent with a urinary tract infection.  Antibiotic prescribed today based on UA and Patient presentation . Take the antibiotic exactly as directed until the full course is finished. Increase oral hydration to help flush the bladder. Advise avoiding caffeine, alcohol until symptoms improve. Send urine for culture to confirm organism and adjust treatment if needed. Review red flags such as fever, flank pain, vomiting, or worsening symptoms. Follow up with primary Wade or return to urgent Wade if symptoms do not improve within 48-72 hr.       [name] was seen today for urinary tract infection symptoms.    Diagnoses and all orders for this visit:    Dysuria  -     [employer] POCT UA; Future  -     [employer] POCT UA    Cystitis with hematuria  -     Culture, Urine; Future  -     Culture, Urine         The indications for early follow-up with PCP and return to UC were discussed. Patient/family received education on the working diagnosis, diagnostic uncertainties, and proposed treatment plan. Indications for emergency evaluation in the ED were reviewed. Written and verbal discharge instructions were provided and discussed and all questions from the patient/family were addressed, with no apparent barriers.

## 2024-06-16 ENCOUNTER — Ambulatory Visit (INDEPENDENT_AMBULATORY_CARE_PROVIDER_SITE_OTHER): Payer: Self-pay | Admitting: Medical

## 2024-06-16 LAB — CULTURE, URINE

## 2024-07-06 ENCOUNTER — Encounter (INDEPENDENT_AMBULATORY_CARE_PROVIDER_SITE_OTHER): Payer: Self-pay

## 2024-07-06 ENCOUNTER — Telehealth (INDEPENDENT_AMBULATORY_CARE_PROVIDER_SITE_OTHER): Payer: Self-pay | Admitting: Internal Medicine

## 2024-07-06 NOTE — Telephone Encounter (Signed)
 Copied from CRM [id number]. Topic: Non-Appointment Question - Document Request  >> Jul 06, 2024  1:25 PM Romualdo T wrote:  Aimee Wade called about Non-Appointment Question - Document Request.  Additional details:  Dr. Elna Car , Neurologist in [facility] calling to requesting last 2 ov notes and recent labs.  Fax: [phone]

## 2025-02-02 ENCOUNTER — Ambulatory Visit (INDEPENDENT_AMBULATORY_CARE_PROVIDER_SITE_OTHER): Admitting: Internal Medicine
